# Patient Record
Sex: Male | Born: 1935 | Race: Black or African American | Hispanic: No | Marital: Married | Smoking: Never smoker
Health system: Southern US, Community
[De-identification: ages and names within clinical notes are randomized; demographics above are authoritative.]

## PROBLEM LIST (undated history)

## (undated) DIAGNOSIS — E119 Type 2 diabetes mellitus without complications: Secondary | ICD-10-CM

## (undated) DIAGNOSIS — N183 Chronic kidney disease, stage 3 unspecified: Secondary | ICD-10-CM

## (undated) DIAGNOSIS — C61 Malignant neoplasm of prostate: Secondary | ICD-10-CM

## (undated) DIAGNOSIS — I10 Essential (primary) hypertension: Secondary | ICD-10-CM

## (undated) DIAGNOSIS — C189 Malignant neoplasm of colon, unspecified: Secondary | ICD-10-CM

## (undated) HISTORY — PX: CARPAL TUNNEL RELEASE: SHX101

## (undated) HISTORY — PX: COLON SURGERY: SHX602

---

## 2005-03-09 ENCOUNTER — Inpatient Hospital Stay (HOSPITAL_COMMUNITY): Admission: EM | Admit: 2005-03-09 | Discharge: 2005-03-12 | Payer: Self-pay | Admitting: Emergency Medicine

## 2016-08-08 DIAGNOSIS — C61 Malignant neoplasm of prostate: Secondary | ICD-10-CM | POA: Insufficient documentation

## 2016-12-26 DIAGNOSIS — N138 Other obstructive and reflux uropathy: Secondary | ICD-10-CM | POA: Insufficient documentation

## 2017-02-07 DIAGNOSIS — G250 Essential tremor: Secondary | ICD-10-CM | POA: Insufficient documentation

## 2017-02-07 DIAGNOSIS — N183 Chronic kidney disease, stage 3 unspecified: Secondary | ICD-10-CM | POA: Diagnosis present

## 2017-02-07 DIAGNOSIS — N1832 Chronic kidney disease, stage 3b: Secondary | ICD-10-CM | POA: Diagnosis present

## 2017-02-07 DIAGNOSIS — Z9989 Dependence on other enabling machines and devices: Secondary | ICD-10-CM | POA: Insufficient documentation

## 2017-02-07 DIAGNOSIS — G4733 Obstructive sleep apnea (adult) (pediatric): Secondary | ICD-10-CM | POA: Insufficient documentation

## 2017-02-07 DIAGNOSIS — J45909 Unspecified asthma, uncomplicated: Secondary | ICD-10-CM | POA: Insufficient documentation

## 2018-08-03 ENCOUNTER — Inpatient Hospital Stay (HOSPITAL_COMMUNITY)
Admission: EM | Admit: 2018-08-03 | Discharge: 2018-08-17 | DRG: 336 | Disposition: A | Discharge status: Home Health | Payer: No Typology Code available for payment source | Attending: Internal Medicine | Admitting: Internal Medicine

## 2018-08-03 ENCOUNTER — Emergency Department (HOSPITAL_COMMUNITY): Payer: No Typology Code available for payment source

## 2018-08-03 ENCOUNTER — Encounter (HOSPITAL_COMMUNITY): Payer: Self-pay | Admitting: *Deleted

## 2018-08-03 ENCOUNTER — Other Ambulatory Visit: Payer: Self-pay

## 2018-08-03 ENCOUNTER — Inpatient Hospital Stay (HOSPITAL_COMMUNITY): Payer: No Typology Code available for payment source

## 2018-08-03 DIAGNOSIS — N189 Chronic kidney disease, unspecified: Secondary | ICD-10-CM | POA: Diagnosis not present

## 2018-08-03 DIAGNOSIS — Z4659 Encounter for fitting and adjustment of other gastrointestinal appliance and device: Secondary | ICD-10-CM

## 2018-08-03 DIAGNOSIS — I441 Atrioventricular block, second degree: Secondary | ICD-10-CM | POA: Diagnosis present

## 2018-08-03 DIAGNOSIS — K567 Ileus, unspecified: Secondary | ICD-10-CM | POA: Diagnosis not present

## 2018-08-03 DIAGNOSIS — Z6841 Body Mass Index (BMI) 40.0 and over, adult: Secondary | ICD-10-CM | POA: Diagnosis not present

## 2018-08-03 DIAGNOSIS — I129 Hypertensive chronic kidney disease with stage 1 through stage 4 chronic kidney disease, or unspecified chronic kidney disease: Secondary | ICD-10-CM | POA: Diagnosis present

## 2018-08-03 DIAGNOSIS — E1169 Type 2 diabetes mellitus with other specified complication: Secondary | ICD-10-CM | POA: Diagnosis present

## 2018-08-03 DIAGNOSIS — N183 Chronic kidney disease, stage 3 unspecified: Secondary | ICD-10-CM | POA: Diagnosis present

## 2018-08-03 DIAGNOSIS — Z1159 Encounter for screening for other viral diseases: Secondary | ICD-10-CM | POA: Diagnosis not present

## 2018-08-03 DIAGNOSIS — E669 Obesity, unspecified: Secondary | ICD-10-CM | POA: Diagnosis not present

## 2018-08-03 DIAGNOSIS — E119 Type 2 diabetes mellitus without complications: Secondary | ICD-10-CM | POA: Diagnosis present

## 2018-08-03 DIAGNOSIS — G4733 Obstructive sleep apnea (adult) (pediatric): Secondary | ICD-10-CM | POA: Diagnosis present

## 2018-08-03 DIAGNOSIS — Z5331 Laparoscopic surgical procedure converted to open procedure: Secondary | ICD-10-CM | POA: Diagnosis not present

## 2018-08-03 DIAGNOSIS — R200 Anesthesia of skin: Secondary | ICD-10-CM | POA: Diagnosis present

## 2018-08-03 DIAGNOSIS — R339 Retention of urine, unspecified: Secondary | ICD-10-CM | POA: Diagnosis present

## 2018-08-03 DIAGNOSIS — Z9049 Acquired absence of other specified parts of digestive tract: Secondary | ICD-10-CM

## 2018-08-03 DIAGNOSIS — D62 Acute posthemorrhagic anemia: Secondary | ICD-10-CM | POA: Diagnosis not present

## 2018-08-03 DIAGNOSIS — D631 Anemia in chronic kidney disease: Secondary | ICD-10-CM | POA: Diagnosis present

## 2018-08-03 DIAGNOSIS — Z85038 Personal history of other malignant neoplasm of large intestine: Secondary | ICD-10-CM | POA: Diagnosis present

## 2018-08-03 DIAGNOSIS — E870 Hyperosmolality and hypernatremia: Secondary | ICD-10-CM | POA: Diagnosis not present

## 2018-08-03 DIAGNOSIS — R0609 Other forms of dyspnea: Secondary | ICD-10-CM

## 2018-08-03 DIAGNOSIS — H9190 Unspecified hearing loss, unspecified ear: Secondary | ICD-10-CM | POA: Diagnosis present

## 2018-08-03 DIAGNOSIS — M5416 Radiculopathy, lumbar region: Secondary | ICD-10-CM | POA: Diagnosis present

## 2018-08-03 DIAGNOSIS — E1122 Type 2 diabetes mellitus with diabetic chronic kidney disease: Secondary | ICD-10-CM | POA: Diagnosis present

## 2018-08-03 DIAGNOSIS — M48061 Spinal stenosis, lumbar region without neurogenic claudication: Secondary | ICD-10-CM | POA: Diagnosis present

## 2018-08-03 DIAGNOSIS — Z7982 Long term (current) use of aspirin: Secondary | ICD-10-CM | POA: Diagnosis not present

## 2018-08-03 DIAGNOSIS — E785 Hyperlipidemia, unspecified: Secondary | ICD-10-CM | POA: Diagnosis present

## 2018-08-03 DIAGNOSIS — K56609 Unspecified intestinal obstruction, unspecified as to partial versus complete obstruction: Secondary | ICD-10-CM

## 2018-08-03 DIAGNOSIS — I959 Hypotension, unspecified: Secondary | ICD-10-CM | POA: Diagnosis not present

## 2018-08-03 DIAGNOSIS — I10 Essential (primary) hypertension: Secondary | ICD-10-CM | POA: Diagnosis present

## 2018-08-03 DIAGNOSIS — N1832 Chronic kidney disease, stage 3b: Secondary | ICD-10-CM | POA: Diagnosis present

## 2018-08-03 DIAGNOSIS — G250 Essential tremor: Secondary | ICD-10-CM | POA: Diagnosis present

## 2018-08-03 DIAGNOSIS — Z923 Personal history of irradiation: Secondary | ICD-10-CM

## 2018-08-03 DIAGNOSIS — K5651 Intestinal adhesions [bands], with partial obstruction: Principal | ICD-10-CM | POA: Diagnosis present

## 2018-08-03 DIAGNOSIS — E876 Hypokalemia: Secondary | ICD-10-CM | POA: Diagnosis not present

## 2018-08-03 DIAGNOSIS — Z79899 Other long term (current) drug therapy: Secondary | ICD-10-CM

## 2018-08-03 DIAGNOSIS — Z8546 Personal history of malignant neoplasm of prostate: Secondary | ICD-10-CM

## 2018-08-03 DIAGNOSIS — Z833 Family history of diabetes mellitus: Secondary | ICD-10-CM

## 2018-08-03 DIAGNOSIS — Z0189 Encounter for other specified special examinations: Secondary | ICD-10-CM

## 2018-08-03 DIAGNOSIS — Z794 Long term (current) use of insulin: Secondary | ICD-10-CM

## 2018-08-03 DIAGNOSIS — Z8719 Personal history of other diseases of the digestive system: Secondary | ICD-10-CM

## 2018-08-03 DIAGNOSIS — N179 Acute kidney failure, unspecified: Secondary | ICD-10-CM | POA: Diagnosis not present

## 2018-08-03 DIAGNOSIS — Z880 Allergy status to penicillin: Secondary | ICD-10-CM

## 2018-08-03 HISTORY — DX: Malignant neoplasm of prostate: C61

## 2018-08-03 HISTORY — DX: Essential (primary) hypertension: I10

## 2018-08-03 HISTORY — DX: Malignant neoplasm of colon, unspecified: C18.9

## 2018-08-03 HISTORY — DX: Type 2 diabetes mellitus without complications: E11.9

## 2018-08-03 HISTORY — DX: Unspecified intestinal obstruction, unspecified as to partial versus complete obstruction: K56.609

## 2018-08-03 HISTORY — DX: Chronic kidney disease, stage 3 unspecified: N18.30

## 2018-08-03 LAB — COMPREHENSIVE METABOLIC PANEL
ALT: 15 U/L (ref 0–44)
AST: 19 U/L (ref 15–41)
Albumin: 3.7 g/dL (ref 3.5–5.0)
Alkaline Phosphatase: 42 U/L (ref 38–126)
Anion gap: 10 (ref 5–15)
BUN: 28 mg/dL — ABNORMAL HIGH (ref 8–23)
CO2: 23 mmol/L (ref 22–32)
Calcium: 8.6 mg/dL — ABNORMAL LOW (ref 8.9–10.3)
Chloride: 104 mmol/L (ref 98–111)
Creatinine, Ser: 2.13 mg/dL — ABNORMAL HIGH (ref 0.61–1.24)
GFR calc Af Amer: 33 mL/min — ABNORMAL LOW (ref >60)
GFR calc non Af Amer: 28 mL/min — ABNORMAL LOW (ref >60)
Glucose, Bld: 199 mg/dL — ABNORMAL HIGH (ref 70–99)
Potassium: 4.6 mmol/L (ref 3.5–5.1)
Sodium: 137 mmol/L (ref 135–145)
Total Bilirubin: 0.9 mg/dL (ref 0.3–1.2)
Total Protein: 6.9 g/dL (ref 6.5–8.1)

## 2018-08-03 LAB — CBC
HCT: 27.4 % — ABNORMAL LOW (ref 39.0–52.0)
Hemoglobin: 9.4 g/dL — ABNORMAL LOW (ref 13.0–17.0)
MCH: 28.4 pg (ref 26.0–34.0)
MCHC: 34.3 g/dL (ref 30.0–36.0)
MCV: 82.8 fL (ref 80.0–100.0)
Platelets: 239 10*3/uL (ref 150–400)
RBC: 3.31 MIL/uL — ABNORMAL LOW (ref 4.22–5.81)
RDW: 14.5 % (ref 11.5–15.5)
WBC: 5.9 10*3/uL (ref 4.0–10.5)
nRBC: 0 % (ref 0.0–0.2)

## 2018-08-03 LAB — URINALYSIS, ROUTINE W REFLEX MICROSCOPIC
Bacteria, UA: NONE SEEN
Bilirubin Urine: NEGATIVE
Glucose, UA: NEGATIVE mg/dL
Hgb urine dipstick: NEGATIVE
Ketones, ur: NEGATIVE mg/dL
Leukocytes,Ua: NEGATIVE
Nitrite: NEGATIVE
Protein, ur: 30 mg/dL — AB
Specific Gravity, Urine: 1.019 (ref 1.005–1.030)
pH: 6 (ref 5.0–8.0)

## 2018-08-03 LAB — SARS CORONAVIRUS 2 BY RT PCR (HOSPITAL ORDER, PERFORMED IN ~~LOC~~ HOSPITAL LAB): SARS Coronavirus 2: NEGATIVE

## 2018-08-03 LAB — LIPASE, BLOOD: Lipase: 31 U/L (ref 11–51)

## 2018-08-03 MED ORDER — SODIUM CHLORIDE 0.9% FLUSH: 3.0000 mL | Freq: Once | INTRAVENOUS | Status: DC

## 2018-08-03 MED ORDER — ACETAMINOPHEN 325 MG PO TABS: 650.0000 mg | ORAL_TABLET | Freq: Four times a day (QID) | ORAL | Status: DC | PRN

## 2018-08-03 MED ORDER — ONDANSETRON HCL 4 MG/2ML IJ SOLN: 4.0000 mg | Freq: Four times a day (QID) | INTRAMUSCULAR | Status: DC | PRN

## 2018-08-03 MED ORDER — ONDANSETRON HCL 4 MG PO TABS: 4.0000 mg | ORAL_TABLET | Freq: Four times a day (QID) | ORAL | Status: DC | PRN

## 2018-08-03 MED ORDER — INSULIN ASPART 100 UNIT/ML ~~LOC~~ SOLN
0.0000 [IU] | SUBCUTANEOUS | Status: DC
  Administered 2018-08-04 (×5): 2 [IU] via SUBCUTANEOUS
  Administered 2018-08-04: 1 [IU] via SUBCUTANEOUS
  Administered 2018-08-05 (×2): 3 [IU] via SUBCUTANEOUS
  Administered 2018-08-05: 2 [IU] via SUBCUTANEOUS
  Administered 2018-08-05: 3 [IU] via SUBCUTANEOUS
  Administered 2018-08-05 – 2018-08-06 (×3): 2 [IU] via SUBCUTANEOUS
  Administered 2018-08-06: 1 [IU] via SUBCUTANEOUS
  Administered 2018-08-06 – 2018-08-08 (×14): 2 [IU] via SUBCUTANEOUS

## 2018-08-03 MED ORDER — HYDRALAZINE HCL 20 MG/ML IJ SOLN: 10.0000 mg | INTRAMUSCULAR | Status: DC | PRN

## 2018-08-03 MED ORDER — DIATRIZOATE MEGLUMINE & SODIUM 66-10 % PO SOLN
90.0000 mL | Freq: Once | ORAL | Status: AC
  Administered 2018-08-04: 90 mL via NASOGASTRIC
  Filled 2018-08-03: qty 90

## 2018-08-03 MED ORDER — SODIUM CHLORIDE 0.9 % IV BOLUS
500.0000 mL | Freq: Once | INTRAVENOUS | Status: AC
  Administered 2018-08-03: 500 mL via INTRAVENOUS

## 2018-08-03 MED ORDER — ACETAMINOPHEN 650 MG RE SUPP: 650.0000 mg | Freq: Four times a day (QID) | RECTAL | Status: DC | PRN

## 2018-08-03 MED ORDER — MORPHINE SULFATE (PF) 2 MG/ML IV SOLN: 1.0000 mg | INTRAVENOUS | Status: DC | PRN

## 2018-08-03 NOTE — ED Provider Notes (Signed)
[facility]PART[facility] Provider Note   CSN: [id number] Arrival date & time: 08/03/18  1410    History   Chief Complaint Chief Complaint  Patient presents with  . Abdominal Pain    HPI Daniel Odom is a 83 y.o. male with history of hypertension, diabetes, reported bowel obstruction who presents with abdominal pain and urinary retention since midnight.  Patient reports he has had some small amounts of urine, but unable to empty his bladder.  He denies any nausea, vomiting.  He is not passing gas.  He reports having a bowel movement yesterday which was somewhat normal.  Patient has history of prostate procedures, but has never had urinary retention, he states he has had incontinence instead.  Patient denies any fever, chest pain, shortness of breath, cough.  Patient also reports a one-month history of intermittent right leg pain and tingling.  He reports yesterday he got off the toilet and his leg was numb and he fell.  He reports he hit towels on a rug and did not hurt himself.  Patient denies any back pain.     HPI  Past Medical History:  Diagnosis Date  . Diabetes mellitus without complication ([place])   . Hypertension     Patient Active Problem List   Diagnosis Date Noted  . SBO (small bowel obstruction) ([place]) 08/03/2018  . History of colon cancer 08/03/2018  . Diabetes mellitus type 2 in obese ([place]) 08/03/2018  . Essential hypertension 08/03/2018  . CKD (chronic kidney disease) stage 3, GFR 30-59 ml/min ([facility]) 08/03/2018  . Anemia associated with chronic renal failure 08/03/2018    Past Surgical History:  Procedure Laterality Date  . COLON SURGERY          Home Medications    Prior to Admission medications   Medication Sig Start Date End Date Taking? Authorizing Provider  Albuterol Sulfate 108 (90 Base) MCG/ACT AEPB Inhale 2 puffs into the lungs every 6 (six) hours as needed for wheezing.   Yes [provider]  aspirin 81 MG  chewable tablet Chew 81 mg by mouth daily.   Yes [provider]  atorvastatin (LIPITOR) 80 MG tablet Take 80 mg by mouth at bedtime.   Yes [provider]  CALCIUM PO Take 1 tablet by mouth 2 (two) times a day.   Yes [provider]  Carboxymethylcellulose Sod PF (REFRESH PLUS) 0.5 % SOLN Place 1 drop into both eyes 2 (two) times daily as needed (dry eyes).    Yes [provider]  Cholecalciferol (VITAMIN D-1000 MAX ST) 25 MCG (1000 UT) tablet Take 1,000 Units by mouth daily.   Yes [provider]  cyclobenzaprine (FLEXERIL) 10 MG tablet Take 10 mg by mouth 3 (three) times daily as needed for muscle spasms.   Yes [provider]  fluticasone (FLONASE) 50 MCG/ACT nasal spray Place 1 spray into both nostrils daily as needed for allergies or rhinitis.   Yes [provider]  insulin NPH-regular Human (NOVOLIN 70/30) (70-30) 100 UNIT/ML injection Inject 7-30 Units into the skin See admin instructions. 7u in the AM and 30 in the evening   Yes [provider]  lisinopril-hydrochlorothiazide (ZESTORETIC) 20-12.5 MG tablet Take 1 tablet by mouth daily.   Yes [provider]  mometasone (ASMANEX) 220 MCG/INH inhaler Inhale 2 puffs into the lungs at bedtime.   Yes [provider]  polyethylene glycol (MIRALAX / GLYCOLAX) 17 g packet Take 17 g by mouth 2 (two) times a  day. 02/16/17  Yes [provider]  primidone (MYSOLINE) 50 MG tablet Take 100-150 mg by mouth See admin instructions. 100mg  in the AM and 150mg  PM   Yes [provider]    Family History Family History  Problem Relation Age of Onset  . Diabetes Mellitus II Brother   . Colon cancer Neg Hx     Social History Social History   Tobacco Use  . Smoking status: Never Smoker  . Smokeless tobacco: Never Used  Substance Use Topics  . Alcohol use: Not on file  . Drug use: Not on file     Allergies   Penicillins   Review of Systems  Review of Systems  Constitutional: Negative for chills and fever.  HENT: Negative for facial swelling and sore throat.   Respiratory: Negative for shortness of breath.   Cardiovascular: Negative for chest pain.  Gastrointestinal: Positive for abdominal pain and constipation. Negative for diarrhea, nausea and vomiting.  Genitourinary: Positive for decreased urine volume and difficulty urinating. Negative for dysuria.  Musculoskeletal: Negative for back pain.  Skin: Negative for rash and wound.  Neurological: Negative for headaches.  Psychiatric/Behavioral: The patient is not nervous/anxious.      Physical Exam Updated Vital Signs BP (!) 148/62 (BP Location: Left Arm)   Pulse 74   Temp 98 F (36.7 C) (Oral)   Resp 18   Ht 5\' 7"  (1.702 m)   Wt 121.5 kg   SpO2 100%   BMI 41.95 kg/m   Physical Exam Vitals signs and nursing note reviewed.  Constitutional:      General: He is not in acute distress.    Appearance: He is well-developed. He is not diaphoretic.  HENT:     Head: Normocephalic and atraumatic.     Mouth/Throat:     Pharynx: No oropharyngeal exudate.  Eyes:     General: No scleral icterus.       Right eye: No discharge.        Left eye: No discharge.     Conjunctiva/sclera: Conjunctivae normal.     Pupils: Pupils are equal, round, and reactive to light.  Neck:     Musculoskeletal: Normal range of motion and neck supple.     Thyroid: No thyromegaly.  Cardiovascular:     Rate and Rhythm: Normal rate and regular rhythm.     Heart sounds: Normal heart sounds. No murmur. No friction rub. No gallop.   Pulmonary:     Effort: Pulmonary effort is normal. No respiratory distress.     Breath sounds: Normal breath sounds. No stridor. No wheezing or rales.  Abdominal:     General: Bowel sounds are normal. There is no distension.     Palpations: Abdomen is soft.     Tenderness: There is abdominal tenderness in the epigastric area and periumbilical area. There is no right  CVA tenderness, left CVA tenderness, guarding or rebound.  Lymphadenopathy:     Cervical: No cervical adenopathy.  Skin:    General: Skin is warm and dry.     Coloration: Skin is not pale.     Findings: No rash.  Neurological:     Mental Status: He is alert.     Coordination: Coordination normal.      ED Treatments / Results  Labs (all labs ordered are listed, but only abnormal results are displayed) Labs Reviewed  COMPREHENSIVE METABOLIC PANEL - Abnormal; Notable for the following components:      Result Value   Glucose, Bld 199 (*)  BUN 28 (*)    Creatinine, Ser 2.13 (*)    Calcium 8.6 (*)    GFR calc non Af Amer 28 (*)    GFR calc Af Amer 33 (*)    All other components within normal limits  CBC - Abnormal; Notable for the following components:   RBC 3.31 (*)    Hemoglobin 9.4 (*)    HCT 27.4 (*)    All other components within normal limits  URINALYSIS, ROUTINE W REFLEX MICROSCOPIC - Abnormal; Notable for the following components:   Protein, ur 30 (*)    All other components within normal limits  GLUCOSE, CAPILLARY - Abnormal; Notable for the following components:   Glucose-Capillary 171 (*)    All other components within normal limits  SARS CORONAVIRUS 2 (HOSPITAL ORDER, [place] [facility])  LIPASE, BLOOD  COMPREHENSIVE METABOLIC PANEL  CBC WITH DIFFERENTIAL/PLATELET    EKG EKG Interpretation  Date/Time:  Thursday August 03 2018 14:35:20 EDT Ventricular Rate:  69 PR Interval:  344 QRS Duration: 114 QT Interval:  432 QTC Calculation: 462 R Axis:   84 Text Interpretation:  Sinus rhythm with 1st degree A-V block Possible Anterior infarct , age undetermined Abnormal ECG no prior to compare with Confirmed by Aletta Edouard [phone]) on 08/03/2018 5:28:52 PM   Radiology Ct Abdomen Pelvis Wo Contrast  Result Date: 08/03/2018 CLINICAL DATA:  History of small bowel obstruction. Recurrent symptoms. EXAM: CT ABDOMEN AND PELVIS WITHOUT CONTRAST  TECHNIQUE: Multidetector CT imaging of the abdomen and pelvis was performed following the standard protocol without IV contrast. COMPARISON:  None. FINDINGS: Lower chest: Normal Hepatobiliary: Liver parenchyma is normal.  No calcified gallstones. Pancreas: Normal Spleen: Normal Adrenals/Urinary Tract: Adrenal glands show mild hyperplasia. No focal renal lesion. The kidneys are slightly small. No obstruction. Bladder appears normal. Stomach/Bowel: Acute small bowel obstruction with dilated fluid and air-filled small intestine. The distal small bowel is collapsed. There is ascites, often seen with small-bowel obstruction. There is sigmoid diverticulosis without evidence of diverticulitis. Previous colon anastomosis in the region of the hepatic flexure colon is patulous at that level, possibly due to denervation. Vascular/Lymphatic: Aortic atherosclerosis. No aneurysm. IVC is normal. No retroperitoneal adenopathy. Reproductive: Multiple prostate seed implants. Other: No free air.  Ascites as noted above. Musculoskeletal: Ordinary lower lumbar degenerative changes. IMPRESSION: Small-bowel obstruction in about the midportion. Partial or early. Distal small bowel is not dilated. Stool in the colon. Previous colon resection/anastomosis in the hepatic flexure region. Small amount of ascites presumably associated with the small bowel obstruction. No free air. Electronically Signed   By: Nelson Chimes M.D.   On: 08/03/2018 20:50   Ct L-spine No Charge  Result Date: 08/03/2018 CLINICAL DATA:  Abdominal pain.  Fall with back pain. EXAM: CT LUMBAR SPINE WITHOUT CONTRAST TECHNIQUE: Multidetector CT imaging of the lumbar spine was performed without intravenous contrast administration. Multiplanar CT image reconstructions were also generated. COMPARISON:  None. FINDINGS: Segmentation: 5 lumbar type vertebral bodies. Alignment: Straightening of the normal lumbar lordosis. Vertebrae: No fracture or primary bone lesion. Paraspinal  and other soft tissues: See results of abdominal CT. Disc levels: No significant disc space finding L3-4 or above. Minimal disc bulges. Ordinary facet osteoarthritis. No compressive canal or foraminal narrowing. L4-5: Moderate bulging of the disc. Mild facet degeneration. Mild stenosis the lateral recesses and foramina. L5-S1: Disc degeneration with disc space narrowing. Chronic endplate osteophytes more prominent towards the right. Facet osteoarthritis. Narrowing of the subarticular lateral recess on the right  and the intervertebral foramen on the right that could possibly be associated with right-sided nerve compression. Solid bridging osteophytes of the sacroiliac joints. IMPRESSION: No acute or traumatic lumbar finding. Degenerative disc disease and degenerative facet disease at L4-5. Mild stenosis of the lateral recesses and foramina at L4-5. Right subarticular lateral recess and foraminal narrowing at L5-S1 Chronic bridging sacroiliac osteophytes. Electronically Signed   By: Nelson Chimes M.D.   On: 08/03/2018 20:44   Dg Abd Portable 1v-small Bowel Protocol-position Verification  Result Date: 08/04/2018 CLINICAL DATA:  Check NG tube placement EXAM: PORTABLE ABDOMEN - 1 VIEW COMPARISON:  None. FINDINGS: Scattered large and small bowel gas is noted. Diffuse small bowel dilatation is noted similar to that seen on recent CT examination. Gastric catheter is been placed with the tip in the stomach. Proximal side port lies in the distal esophagus. This should be advanced several cm further into the stomach. IMPRESSION: Nasogastric catheter with tip in stomach. This should be advanced several cm to allow the proximal side port to lie within the stomach as well. Persistent small bowel dilatation. Electronically Signed   By: Inez Catalina M.D.   On: 08/04/2018 00:00    Procedures Procedures (including critical care time)  Medications Ordered in ED Medications  sodium chloride flush (NS) 0.9 % injection 3 mL  (has no administration in time range)  acetaminophen (TYLENOL) tablet 650 mg (has no administration in time range)    Or  acetaminophen (TYLENOL) suppository 650 mg (has no administration in time range)  ondansetron (ZOFRAN) tablet 4 mg (has no administration in time range)    Or  ondansetron (ZOFRAN) injection 4 mg (has no administration in time range)  insulin aspart (novoLOG) injection 0-9 Units (2 Units Subcutaneous Given 08/04/18 0015)  hydrALAZINE (APRESOLINE) injection 10 mg (has no administration in time range)  morphine 2 MG/ML injection 1 mg (has no administration in time range)  diatrizoate meglumine-sodium (GASTROGRAFIN) 66-10 % solution 90 mL (has no administration in time range)  sodium chloride 0.9 % bolus 500 mL (0 mLs Intravenous Stopped 08/03/18 2003)     Initial Impression / Assessment and Plan / ED Course  I have reviewed the triage vital signs and the nursing notes.  Pertinent labs & imaging results that were available during my care of the patient were reviewed by me and considered in my medical decision making (see chart for details).  Clinical Course as of Aug 04 134  Thu Aug 02, 4085  [id number] 83 year old male here with abdominal pain and dry heaving and also feeling like he is urinating less.  He is got some mild tenderness diffusely in his abdomen.  CT is showing a partial bowel obstruction.  We are placing an NG and he will need to be admitted to the hospital for further management.   [MB]    Clinical Course User Index [MB] Hayden Rasmussen, MD       Patient presenting with abdominal pain and decreased urination.  Bladder scan shows only around 30 cc.  Patient able to urinate to some degree.  CT abdomen pelvis shows small bowel obstruction.  I discussed patient case with Dr. Donne Hazel with general surgery who will evaluate the patient, but recommend medical admission with NG tube.  Patient not requiring pain medication in the ED, comfortable lying flat.   Regarding patient's intermittent leg pain, suspect radicular pain.  CT of the lumbar does show some stenosis at L4-S1.  I discussed patient case with Dr. Hal Hope with [facility]  who accepts patient for admission.  Patient also evaluated by my attending, Dr. Melina Copa, who guided the patient's management and agrees with plan.  Final Clinical Impressions(s) / ED Diagnoses   Final diagnoses:  SBO (small bowel obstruction) [facility])  Lumbar radiculopathy    ED Discharge Orders    None       Frederica Kuster, PA-C 08/04/18 0136    Hayden Rasmussen, MD 08/04/18 1011

## 2018-08-03 NOTE — Consult Note (Signed)
Reason for Consult:abd pain Referring Physician: Armstead Peaks PA  Daniel Odom is an 83 y.o. male.  HPI: 83 yom with history of right colectomy for colon cancer in 1993,. Prior sbo in 2007 managed conservatively presents with about 24 hours history of nausea, dry heaves, no flatus or bm and inability to urinate. This is worsening. Nothing doing at home making it better.  No cough. No fever.  He did have fall off the toilet today but no issues from that.  He was evaluated in er and appears to have sbo on ct scan. He has ng tube already when I see him    Past Medical History:  Diagnosis Date  . Diabetes mellitus without complication ([place])   . Hypertension     Past Surgical History:  Procedure Laterality Date  . COLON SURGERY      Family History  Problem Relation Age of Onset  . Diabetes Mellitus II Brother   . Colon cancer Neg Hx     Social History:  reports that he has never smoked. He has never used smokeless tobacco. No history on file for alcohol and drug.  Allergies:  Allergies  Allergen Reactions  . Penicillins Hives    Did it involve swelling of the face/tongue/throat, SOB, or low BP? Yes Did it involve sudden or severe rash/hives, skin peeling, or any reaction on the inside of your mouth or nose? No Did you need to seek medical attention at a hospital or doctor's office? Yes When did it last happen?1968 If all above answers are "NO", may proceed with cephalosporin use.     Medications: I have reviewed the patient's current medications.  Results for orders placed or performed during the hospital encounter of 08/03/18 (from the past 48 hour(s))  Lipase, blood     Status: None   Collection Time: 08/03/18  2:45 PM  Result Value Ref Range   Lipase 31 11 - 51 U/L    Comment: Performed at [facility], [place] [address] [place], [place] [address]  Comprehensive metabolic panel     Status: Abnormal   Collection Time: 08/03/18  2:45 PM  Result Value Ref Range   Sodium 137 135 - 145 mmol/L   Potassium 4.6 3.5 - 5.1 mmol/L   Chloride 104 98 - 111 mmol/L   CO2 23 22 - 32 mmol/L   Glucose, Bld 199 (H) 70 - 99 mg/dL   BUN 28 (H) 8 - 23 mg/dL   Creatinine, Ser 2.13 (H) 0.61 - 1.24 mg/dL   Calcium 8.6 (L) 8.9 - 10.3 mg/dL   Total Protein 6.9 6.5 - 8.1 g/dL   Albumin 3.7 3.5 - 5.0 g/dL   AST 19 15 - 41 U/L   ALT 15 0 - 44 U/L   Alkaline Phosphatase 42 38 - 126 U/L   Total Bilirubin 0.9 0.3 - 1.2 mg/dL   GFR calc non Af Amer 28 (L) >60 mL/min   GFR calc Af Amer 33 (L) >60 mL/min   Anion gap 10 5 - 15    Comment: Performed at [facility] [address] [place] [address]  CBC     Status: Abnormal   Collection Time: 08/03/18  2:45 PM  Result Value Ref Range   WBC 5.9 4.0 - 10.5 K/uL   RBC 3.31 (L) 4.22 - 5.81 MIL/uL   Hemoglobin 9.4 (L) 13.0 - 17.0 g/dL   HCT 27.4 (L) 39.0 - 52.0 %   MCV 82.8 80.0 -  100.0 fL   MCH 28.4 26.0 - 34.0 pg   MCHC 34.3 30.0 - 36.0 g/dL   RDW 14.5 11.5 - 15.5 %   Platelets 239 150 - 400 K/uL   nRBC 0.0 0.0 - 0.2 %    Comment: Performed at [facility], [place] [address] [place], [place] [address]  SARS Coronavirus 2 (CEPHEID - Performed in [facility] hospital lab), Hosp Order     Status: None   Collection Time: 08/03/18  9:21 PM   Specimen: Nasopharyngeal Swab  Result Value Ref Range   SARS Coronavirus 2 NEGATIVE NEGATIVE    Comment: (NOTE) If result is NEGATIVE SARS-CoV-2 target nucleic acids are NOT DETECTED. The SARS-CoV-2 RNA is generally detectable in upper and lower  respiratory specimens during the acute phase of infection. The lowest  concentration of SARS-CoV-2 viral copies this assay can detect is 250  copies / mL. A negative result does not preclude SARS-CoV-2 infection  and should not be used as the sole basis for treatment or other  patient management decisions.  A negative result may occur with  improper specimen collection / handling, submission of specimen other  than  nasopharyngeal swab, presence of viral mutation(s) within the  areas targeted by this assay, and inadequate number of viral copies  (<250 copies / mL). A negative result must be combined with clinical  observations, patient history, and epidemiological information. If result is POSITIVE SARS-CoV-2 target nucleic acids are DETECTED. The SARS-CoV-2 RNA is generally detectable in upper and lower  respiratory specimens dur ing the acute phase of infection.  Positive  results are indicative of active infection with SARS-CoV-2.  Clinical  correlation with patient history and other diagnostic information is  necessary to determine patient infection status.  Positive results do  not rule out bacterial infection or co-infection with other viruses. If result is PRESUMPTIVE POSTIVE SARS-CoV-2 nucleic acids MAY BE PRESENT.   A presumptive positive result was obtained on the submitted specimen  and confirmed on repeat testing.  While 2019 novel coronavirus  (SARS-CoV-2) nucleic acids may be present in the submitted sample  additional confirmatory testing may be necessary for epidemiological  and / or clinical management purposes  to differentiate between  SARS-CoV-2 and other Sarbecovirus currently known to infect humans.  If clinically indicated additional testing with an alternate test  methodology [phone]) is advised. The SARS-CoV-2 RNA is generally  detectable in upper and lower respiratory sp ecimens during the acute  phase of infection. The expected result is Negative. Fact Sheet for Patients:  StrictlyIdeas.no Fact Sheet for Healthcare Providers: BankingDealers.co.za This test is not yet approved or cleared by the [place] FDA and has been authorized for detection and/or diagnosis of SARS-CoV-2 by FDA under an Emergency Use Authorization (EUA).  This EUA will remain in effect (meaning this test can be used) for the duration of the  COVID-19 declaration under Section 564(b)(1) of the Act, 21 U.S.C. section 360bbb-3(b)(1), unless the authorization is terminated or revoked sooner. Performed at [facility], [place] [address] [place], [place] [address]     Ct Abdomen Pelvis Wo Contrast  Result Date: 08/03/2018 CLINICAL DATA:  History of small bowel obstruction. Recurrent symptoms. EXAM: CT ABDOMEN AND PELVIS WITHOUT CONTRAST TECHNIQUE: Multidetector CT imaging of the abdomen and pelvis was performed following the standard protocol without IV contrast. COMPARISON:  None. FINDINGS: Lower chest: Normal Hepatobiliary: Liver parenchyma is normal.  No calcified gallstones. Pancreas: Normal Spleen: Normal Adrenals/Urinary Tract: Adrenal  glands show mild hyperplasia. No focal renal lesion. The kidneys are slightly small. No obstruction. Bladder appears normal. Stomach/Bowel: Acute small bowel obstruction with dilated fluid and air-filled small intestine. The distal small bowel is collapsed. There is ascites, often seen with small-bowel obstruction. There is sigmoid diverticulosis without evidence of diverticulitis. Previous colon anastomosis in the region of the hepatic flexure colon is patulous at that level, possibly due to denervation. Vascular/Lymphatic: Aortic atherosclerosis. No aneurysm. IVC is normal. No retroperitoneal adenopathy. Reproductive: Multiple prostate seed implants. Other: No free air.  Ascites as noted above. Musculoskeletal: Ordinary lower lumbar degenerative changes. IMPRESSION: Small-bowel obstruction in about the midportion. Partial or early. Distal small bowel is not dilated. Stool in the colon. Previous colon resection/anastomosis in the hepatic flexure region. Small amount of ascites presumably associated with the small bowel obstruction. No free air. Electronically Signed   By: Nelson Chimes M.D.   On: 08/03/2018 20:50   Ct L-spine No Charge  Result Date: 08/03/2018 CLINICAL DATA:  Abdominal pain.  Fall with  back pain. EXAM: CT LUMBAR SPINE WITHOUT CONTRAST TECHNIQUE: Multidetector CT imaging of the lumbar spine was performed without intravenous contrast administration. Multiplanar CT image reconstructions were also generated. COMPARISON:  None. FINDINGS: Segmentation: 5 lumbar type vertebral bodies. Alignment: Straightening of the normal lumbar lordosis. Vertebrae: No fracture or primary bone lesion. Paraspinal and other soft tissues: See results of abdominal CT. Disc levels: No significant disc space finding L3-4 or above. Minimal disc bulges. Ordinary facet osteoarthritis. No compressive canal or foraminal narrowing. L4-5: Moderate bulging of the disc. Mild facet degeneration. Mild stenosis the lateral recesses and foramina. L5-S1: Disc degeneration with disc space narrowing. Chronic endplate osteophytes more prominent towards the right. Facet osteoarthritis. Narrowing of the subarticular lateral recess on the right and the intervertebral foramen on the right that could possibly be associated with right-sided nerve compression. Solid bridging osteophytes of the sacroiliac joints. IMPRESSION: No acute or traumatic lumbar finding. Degenerative disc disease and degenerative facet disease at L4-5. Mild stenosis of the lateral recesses and foramina at L4-5. Right subarticular lateral recess and foraminal narrowing at L5-S1 Chronic bridging sacroiliac osteophytes. Electronically Signed   By: Nelson Chimes M.D.   On: 08/03/2018 20:44    Review of Systems  Gastrointestinal: Positive for abdominal pain, constipation and nausea.  All other systems reviewed and are negative.  Blood pressure (!) 81/59, pulse 91, temperature 98.2 F (36.8 C), temperature source Oral, resp. rate 16, SpO2 99 %. Physical Exam  Constitutional: He is oriented to person, place, and time. He appears well-developed and well-nourished.  HENT:  Head: Normocephalic and atraumatic.  Right Ear: External ear normal.  Left Ear: External ear normal.   Eyes: EOM are normal. No scleral icterus.  Neck: Neck supple.  Cardiovascular: Normal rate, regular rhythm and normal heart sounds.  Respiratory: Effort normal and breath sounds normal.  GI: Soft. He exhibits distension (mild). Bowel sounds are decreased. There is no abdominal tenderness.    Musculoskeletal:        General: No tenderness.  Neurological: He is alert and oriented to person, place, and time.  Skin: Skin is warm and dry.  Psychiatric: He has a normal mood and affect. His behavior is normal.    Assessment/Plan: SBO -ng tube, protocol, hopefully will resolve conservatively -can start on pharm dvt prophylaxis from surgery standpoint -discussed indication for surgery being worsening or nonprogression  Rolm Bookbinder 08/03/2018, 11:24 PM

## 2018-08-03 NOTE — ED Notes (Signed)
Called to give report. Left on hold

## 2018-08-03 NOTE — ED Notes (Signed)
Bladder scan showed approximately 33 mL urine.

## 2018-08-03 NOTE — ED Notes (Signed)
[place] (daugther) update given.

## 2018-08-03 NOTE — ED Triage Notes (Addendum)
To ED for eval of abd pain. States he got up to use the bathroom during the night -when getting off the toilet his left leg was numb causing him to fall. States he landed on towels and a rug so no injury. No cp. No sob. States he's been urinating less and less. Pt appears in nad. Alert and oriented.

## 2018-08-03 NOTE — ED Notes (Signed)
ED TO INPATIENT HANDOFF REPORT  ED Nurse Name and Phone #:  [phone]  S Name/Age/Gender Daniel Odom 83 y.o. male Room/Bed: 038C/038C  Code Status   Code Status: Not on file  Home/SNF/Other Home Patient oriented to: self, place, time and situation Is this baseline? Yes   Triage Complete: Triage complete  Chief Complaint abd pain  Triage Note To ED for eval of abd pain. States he got up to use the bathroom during the night -when getting off the toilet his left leg was numb causing him to fall. States he landed on towels and a rug so no injury. No cp. No sob. States he's been urinating less and less. Pt appears in nad. Alert and oriented.    Allergies Allergies  Allergen Reactions  . Penicillins Hives    Did it involve swelling of the face/tongue/throat, SOB, or low BP? Yes Did it involve sudden or severe rash/hives, skin peeling, or any reaction on the inside of your mouth or nose? No Did you need to seek medical attention at a hospital or doctor's office? Yes When did it last happen?1968 If all above answers are "NO", may proceed with cephalosporin use.     Level of Care/Admitting Diagnosis ED Disposition    ED Disposition Condition [place] [facility] Area: [place] [[id number]]  Level of Care: Telemetry Medical [104]  Covid Evaluation: Asymptomatic Screening Protocol (No Symptoms)  Diagnosis: SBO (small bowel obstruction) [facility]) [[id number]]  Admitting Physician: Rise Patience [phone]  Attending Physician: Rise Patience [phone]  Estimated length of stay: past midnight tomorrow  Certification:: I certify this patient will need inpatient services for at least 2 midnights  PT Class (Do Not Modify): Inpatient [101]  PT Acc Code (Do Not Modify): Private [1]       B Medical/Surgery History Past Medical History:  Diagnosis Date  . Diabetes mellitus without complication ([place])   . Hypertension       A IV  Location/Drains/Wounds Patient Lines/Drains/Airways Status   Active Line/Drains/Airways    Name:   Placement date:   Placement time:   Site:   Days:   Peripheral IV 08/03/18 Right Antecubital   08/03/18    1902    Antecubital   less than 1          Intake/Output Last 24 hours  Intake/Output Summary (Last 24 hours) at 08/03/2018 2147 Last data filed at 08/03/2018 1753 Gross per 24 hour  Intake 0 ml  Output 0 ml  Net 0 ml    Labs/Imaging Results for orders placed or performed during the hospital encounter of 08/03/18 (from the past 48 hour(s))  Lipase, blood     Status: None   Collection Time: 08/03/18  2:45 PM  Result Value Ref Range   Lipase 31 11 - 51 U/L    Comment: Performed at [facility], [place] [address], [place], [place] [address]  Comprehensive metabolic panel     Status: Abnormal   Collection Time: 08/03/18  2:45 PM  Result Value Ref Range   Sodium 137 135 - 145 mmol/L   Potassium 4.6 3.5 - 5.1 mmol/L   Chloride 104 98 - 111 mmol/L   CO2 23 22 - 32 mmol/L   Glucose, Bld 199 (H) 70 - 99 mg/dL   BUN 28 (H) 8 - 23 mg/dL   Creatinine, Ser 2.13 (H) 0.61 - 1.24 mg/dL   Calcium 8.6 (L) 8.9 - 10.3 mg/dL   Total Protein  6.9 6.5 - 8.1 g/dL   Albumin 3.7 3.5 - 5.0 g/dL   AST 19 15 - 41 U/L   ALT 15 0 - 44 U/L   Alkaline Phosphatase 42 38 - 126 U/L   Total Bilirubin 0.9 0.3 - 1.2 mg/dL   GFR calc non Af Amer 28 (L) >60 mL/min   GFR calc Af Amer 33 (L) >60 mL/min   Anion gap 10 5 - 15    Comment: Performed at [facility] [address], [place], [place] [address]  CBC     Status: Abnormal   Collection Time: 08/03/18  2:45 PM  Result Value Ref Range   WBC 5.9 4.0 - 10.5 K/uL   RBC 3.31 (L) 4.22 - 5.81 MIL/uL   Hemoglobin 9.4 (L) 13.0 - 17.0 g/dL   HCT 27.4 (L) 39.0 - 52.0 %   MCV 82.8 80.0 - 100.0 fL   MCH 28.4 26.0 - 34.0 pg   MCHC 34.3 30.0 - 36.0 g/dL   RDW 14.5 11.5 - 15.5 %   Platelets 239 150 - 400 K/uL   nRBC 0.0 0.0 - 0.2 %    Comment:  Performed at [facility], [place] [address], [place], [place] [address]   Ct Abdomen Pelvis Wo Contrast  Result Date: 08/03/2018 CLINICAL DATA:  History of small bowel obstruction. Recurrent symptoms. EXAM: CT ABDOMEN AND PELVIS WITHOUT CONTRAST TECHNIQUE: Multidetector CT imaging of the abdomen and pelvis was performed following the standard protocol without IV contrast. COMPARISON:  None. FINDINGS: Lower chest: Normal Hepatobiliary: Liver parenchyma is normal.  No calcified gallstones. Pancreas: Normal Spleen: Normal Adrenals/Urinary Tract: Adrenal glands show mild hyperplasia. No focal renal lesion. The kidneys are slightly small. No obstruction. Bladder appears normal. Stomach/Bowel: Acute small bowel obstruction with dilated fluid and air-filled small intestine. The distal small bowel is collapsed. There is ascites, often seen with small-bowel obstruction. There is sigmoid diverticulosis without evidence of diverticulitis. Previous colon anastomosis in the region of the hepatic flexure colon is patulous at that level, possibly due to denervation. Vascular/Lymphatic: Aortic atherosclerosis. No aneurysm. IVC is normal. No retroperitoneal adenopathy. Reproductive: Multiple prostate seed implants. Other: No free air.  Ascites as noted above. Musculoskeletal: Ordinary lower lumbar degenerative changes. IMPRESSION: Small-bowel obstruction in about the midportion. Partial or early. Distal small bowel is not dilated. Stool in the colon. Previous colon resection/anastomosis in the hepatic flexure region. Small amount of ascites presumably associated with the small bowel obstruction. No free air. Electronically Signed   By: Nelson Chimes M.D.   On: 08/03/2018 20:50   Ct L-spine No Charge  Result Date: 08/03/2018 CLINICAL DATA:  Abdominal pain.  Fall with back pain. EXAM: CT LUMBAR SPINE WITHOUT CONTRAST TECHNIQUE: Multidetector CT imaging of the lumbar spine was performed without intravenous contrast  administration. Multiplanar CT image reconstructions were also generated. COMPARISON:  None. FINDINGS: Segmentation: 5 lumbar type vertebral bodies. Alignment: Straightening of the normal lumbar lordosis. Vertebrae: No fracture or primary bone lesion. Paraspinal and other soft tissues: See results of abdominal CT. Disc levels: No significant disc space finding L3-4 or above. Minimal disc bulges. Ordinary facet osteoarthritis. No compressive canal or foraminal narrowing. L4-5: Moderate bulging of the disc. Mild facet degeneration. Mild stenosis the lateral recesses and foramina. L5-S1: Disc degeneration with disc space narrowing. Chronic endplate osteophytes more prominent towards the right. Facet osteoarthritis. Narrowing of the subarticular lateral recess on the right and the intervertebral foramen on the right that could possibly be  associated with right-sided nerve compression. Solid bridging osteophytes of the sacroiliac joints. IMPRESSION: No acute or traumatic lumbar finding. Degenerative disc disease and degenerative facet disease at L4-5. Mild stenosis of the lateral recesses and foramina at L4-5. Right subarticular lateral recess and foraminal narrowing at L5-S1 Chronic bridging sacroiliac osteophytes. Electronically Signed   By: Nelson Chimes M.D.   On: 08/03/2018 20:44    Pending Labs Unresulted Labs (From admission, onward)    Start     Ordered   08/03/18 2121  SARS Coronavirus 2 (CEPHEID - Performed in [facility] hospital lab), Hosp Order  (Asymptomatic Patients Labs)  Once,   STAT    Question:  Rule Out  Answer:  Yes   08/03/18 2120   08/03/18 1442  Urinalysis, Routine w reflex microscopic  ONCE - STAT,   STAT     08/03/18 1442          Vitals/Pain Today's Vitals   08/03/18 1830 08/03/18 1904 08/03/18 1930 08/03/18 2000  BP: (!) 132/54 (!) 143/52 (!) 142/55 (!) 145/55  Pulse:  (!) 55 (!) 44 (!) 44  Resp: 16 17 14 16   Temp:      TempSrc:      SpO2:  100% 100% 100%  PainSc:         Isolation Precautions No active isolations  Medications Medications  sodium chloride flush (NS) 0.9 % injection 3 mL (has no administration in time range)  sodium chloride 0.9 % bolus 500 mL (0 mLs Intravenous Stopped 08/03/18 2003)    Mobility walks Low fall risk   Focused Assessments Cardiac Assessment Handoff:  Cardiac Rhythm: Sinus bradycardia No results found for: CKTOTAL, CKMB, CKMBINDEX, TROPONINI No results found for: DDIMER Does the Patient currently have chest pain? No      R Recommendations: See Admitting Provider Note  Report given to:   Additional Notes:

## 2018-08-03 NOTE — ED Notes (Signed)
Attempted to place NGT on right nostril; 16 fr. Pt unable to tolerate. Noted HR increased; Sp02 remain stable on RA; Pt attempted to tolerate for a few mins; unable to do so. NGT is removed.

## 2018-08-03 NOTE — H&P (Signed)
History and Physical    Daniel Odom WSF:[id number] DOB: September 04, 1935 DOA: 08/03/2018  PCP: [facility]  Patient coming from: Home.  Chief Complaint: Abdominal pain.  HPI: Daniel Odom is a 83 y.o. male with history of colon cancer status post right hemicolectomy, history of prostate cancer, diabetes mellitus type 2, hypertension, chronic kidney disease stage III, anemia, sleep apnea presents to the ER with abdominal pain which is been diffuse over the last 24 hours with nausea and has not moved bowels for last 24 hours and has not had any flatus.  Patient states he had a similar episode in 2007.  Due to worsening symptoms patient came to the ER.  Over the last 24 hours patient also had some difficulty urinating.  ED Course: In the ER patient had a CT abdomen pelvis which shows features consistent with small bowel obstruction.  On-call general surgery was consulted patient was placed on NG tube admitted for small bowel obstruction.  Patient also has some difficulty urinating.  Labs revealed hemoglobin 9.4 platelets 239 creatinine 2.1.  Last creatinine last year was around 1.8.  Review of Systems: As per HPI, rest all negative.   Past Medical History:  Diagnosis Date  . Diabetes mellitus without complication ([place])   . Hypertension     Past Surgical History:  Procedure Laterality Date  . COLON SURGERY       reports that he has never smoked. He has never used smokeless tobacco. No history on file for alcohol and drug.  Allergies  Allergen Reactions  . Penicillins Hives    Did it involve swelling of the face/tongue/throat, SOB, or low BP? Yes Did it involve sudden or severe rash/hives, skin peeling, or any reaction on the inside of your mouth or nose? No Did you need to seek medical attention at a hospital or doctor's office? Yes When did it last happen?1968 If all above answers are "NO", may proceed with cephalosporin use.     Family History  Problem Relation Age  of Onset  . Diabetes Mellitus II Brother   . Colon cancer Neg Hx     Prior to Admission medications   Medication Sig Start Date End Date Taking? Authorizing Provider  Albuterol Sulfate 108 (90 Base) MCG/ACT AEPB Inhale 2 puffs into the lungs every 6 (six) hours as needed for wheezing.   Yes [provider]  aspirin 81 MG chewable tablet Chew 81 mg by mouth daily.   Yes [provider]  atorvastatin (LIPITOR) 80 MG tablet Take 80 mg by mouth at bedtime.   Yes [provider]  CALCIUM PO Take 1 tablet by mouth 2 (two) times a day.   Yes [provider]  Carboxymethylcellulose Sod PF (REFRESH PLUS) 0.5 % SOLN Place 1 drop into both eyes 2 (two) times daily as needed (dry eyes).    Yes [provider]  Cholecalciferol (VITAMIN D-1000 MAX ST) 25 MCG (1000 UT) tablet Take 1,000 Units by mouth daily.   Yes [provider]  cyclobenzaprine (FLEXERIL) 10 MG tablet Take 10 mg by mouth 3 (three) times daily as needed for muscle spasms.   Yes [provider]  fluticasone (FLONASE) 50 MCG/ACT nasal spray Place 1 spray into both nostrils daily as needed for allergies or rhinitis.   Yes [provider]  insulin NPH-regular Human (NOVOLIN 70/30) (70-30) 100 UNIT/ML injection Inject 7-30 Units into the skin See admin instructions. 7u in the AM and 30 in the evening   Yes  [provider]  lisinopril-hydrochlorothiazide (ZESTORETIC) 20-12.5 MG tablet Take 1 tablet by mouth daily.   Yes [provider]  mometasone (ASMANEX) 220 MCG/INH inhaler Inhale 2 puffs into the lungs at bedtime.   Yes [provider]  polyethylene glycol (MIRALAX / GLYCOLAX) 17 g packet Take 17 g by mouth 2 (two) times a day. 02/16/17  Yes [provider]  primidone (MYSOLINE) 50 MG tablet Take 100-150 mg by mouth See admin instructions. 100mg  in the AM and 150mg  PM   Yes [provider]    Physical Exam: Constitutional:  Moderately built and nourished. Vitals:   08/03/18 1904 08/03/18 1930 08/03/18 2000 08/03/18 2145  BP: (!) 143/52 (!) 142/55 (!) 145/55 (!) 81/59  Pulse: (!) 55 (!) 44 (!) 44 91  Resp: 17 14 16 16   Temp:      TempSrc:      SpO2: 100% 100% 100% 99%   Eyes: Anicteric no pallor. ENMT: No discharge from the ears eyes nose or mouth. Neck: No mass felt.  No neck rigidity. Respiratory: No rhonchi or crepitations. Cardiovascular: S1-S2 heard. Abdomen: Soft distended bowel sounds not appreciated no guarding rigidity. Musculoskeletal: No edema. Skin: No rash. Neurologic: Alert awake oriented to time place and person.  Moves all extremities. Psychiatric: Appears normal.  Normal affect.   Labs on Admission: I have personally reviewed following labs and imaging studies  CBC: Recent Labs  Lab 08/03/18 1445  WBC 5.9  HGB 9.4*  HCT 27.4*  MCV 82.8  PLT 932   Basic Metabolic Panel: Recent Labs  Lab 08/03/18 1445  NA 137  K 4.6  CL 104  CO2 23  GLUCOSE 199*  BUN 28*  CREATININE 2.13*  CALCIUM 8.6*   GFR: CrCl cannot be calculated (Unknown ideal weight.). Liver Function Tests: Recent Labs  Lab 08/03/18 1445  AST 19  ALT 15  ALKPHOS 42  BILITOT 0.9  PROT 6.9  ALBUMIN 3.7   Recent Labs  Lab 08/03/18 1445  LIPASE 31   No results for input(s): AMMONIA in the last 168 hours. Coagulation Profile: No results for input(s): INR, PROTIME in the last 168 hours. Cardiac Enzymes: No results for input(s): CKTOTAL, CKMB, CKMBINDEX, TROPONINI in the last 168 hours. BNP (last 3 results) No results for input(s): PROBNP in the last 8760 hours. HbA1C: No results for input(s): HGBA1C in the last 72 hours. CBG: No results for input(s): GLUCAP in the last 168 hours. Lipid Profile: No results for input(s): CHOL, HDL, LDLCALC, TRIG, CHOLHDL, LDLDIRECT in the last 72 hours. Thyroid Function Tests: No results for input(s): TSH, T4TOTAL, FREET4, T3FREE, THYROIDAB in the last 72 hours.  Anemia Panel: No results for input(s): VITAMINB12, FOLATE, FERRITIN, TIBC, IRON, RETICCTPCT in the last 72 hours. Urine analysis: No results found for: COLORURINE, APPEARANCEUR, LABSPEC, PHURINE, GLUCOSEU, HGBUR, BILIRUBINUR, KETONESUR, PROTEINUR, UROBILINOGEN, NITRITE, LEUKOCYTESUR Sepsis Labs: @LABRCNTIP (procalcitonin:4,lacticidven:4) ) Recent Results (from the past 240 hour(s))  SARS Coronavirus 2 (CEPHEID - Performed in [place] hospital lab), Hosp Order     Status: None   Collection Time: 08/03/18  9:21 PM   Specimen: Nasopharyngeal Swab  Result Value Ref Range Status   SARS Coronavirus 2 NEGATIVE NEGATIVE Final    Comment: (NOTE) If result is NEGATIVE SARS-CoV-2 target nucleic acids are NOT DETECTED. The SARS-CoV-2 RNA is generally detectable in upper and lower  respiratory specimens during the acute phase of infection. The lowest  concentration of SARS-CoV-2 viral copies this assay can detect is 250  copies /  mL. A negative result does not preclude SARS-CoV-2 infection  and should not be used as the sole basis for treatment or other  patient management decisions.  A negative result may occur with  improper specimen collection / handling, submission of specimen other  than nasopharyngeal swab, presence of viral mutation(s) within the  areas targeted by this assay, and inadequate number of viral copies  (<250 copies / mL). A negative result must be combined with clinical  observations, patient history, and epidemiological information. If result is POSITIVE SARS-CoV-2 target nucleic acids are DETECTED. The SARS-CoV-2 RNA is generally detectable in upper and lower  respiratory specimens dur ing the acute phase of infection.  Positive  results are indicative of active infection with SARS-CoV-2.  Clinical  correlation with patient history and other diagnostic information is  necessary to determine patient infection status.  Positive results do  not rule out bacterial infection  or co-infection with other viruses. If result is PRESUMPTIVE POSTIVE SARS-CoV-2 nucleic acids MAY BE PRESENT.   A presumptive positive result was obtained on the submitted specimen  and confirmed on repeat testing.  While 2019 novel coronavirus  (SARS-CoV-2) nucleic acids may be present in the submitted sample  additional confirmatory testing may be necessary for epidemiological  and / or clinical management purposes  to differentiate between  SARS-CoV-2 and other Sarbecovirus currently known to infect humans.  If clinically indicated additional testing with an alternate test  methodology [phone]) is advised. The SARS-CoV-2 RNA is generally  detectable in upper and lower respiratory sp ecimens during the acute  phase of infection. The expected result is Negative. Fact Sheet for Patients:  StrictlyIdeas.no Fact Sheet for Healthcare Providers: BankingDealers.co.za This test is not yet approved or cleared by the [place] FDA and has been authorized for detection and/or diagnosis of SARS-CoV-2 by FDA under an Emergency Use Authorization (EUA).  This EUA will remain in effect (meaning this test can be used) for the duration of the COVID-19 declaration under Section 564(b)(1) of the Act, 21 U.S.C. section 360bbb-3(b)(1), unless the authorization is terminated or revoked sooner. Performed at [facility], [place] [address], [place], [place] [address]      Radiological Exams on Admission: Ct Abdomen Pelvis Wo Contrast  Result Date: 08/03/2018 CLINICAL DATA:  History of small bowel obstruction. Recurrent symptoms. EXAM: CT ABDOMEN AND PELVIS WITHOUT CONTRAST TECHNIQUE: Multidetector CT imaging of the abdomen and pelvis was performed following the standard protocol without IV contrast. COMPARISON:  None. FINDINGS: Lower chest: Normal Hepatobiliary: Liver parenchyma is normal.  No calcified gallstones. Pancreas: Normal Spleen: Normal  Adrenals/Urinary Tract: Adrenal glands show mild hyperplasia. No focal renal lesion. The kidneys are slightly small. No obstruction. Bladder appears normal. Stomach/Bowel: Acute small bowel obstruction with dilated fluid and air-filled small intestine. The distal small bowel is collapsed. There is ascites, often seen with small-bowel obstruction. There is sigmoid diverticulosis without evidence of diverticulitis. Previous colon anastomosis in the region of the hepatic flexure colon is patulous at that level, possibly due to denervation. Vascular/Lymphatic: Aortic atherosclerosis. No aneurysm. IVC is normal. No retroperitoneal adenopathy. Reproductive: Multiple prostate seed implants. Other: No free air.  Ascites as noted above. Musculoskeletal: Ordinary lower lumbar degenerative changes. IMPRESSION: Small-bowel obstruction in about the midportion. Partial or early. Distal small bowel is not dilated. Stool in the colon. Previous colon resection/anastomosis in the hepatic flexure region. Small amount of ascites presumably associated with the small bowel obstruction. No free air. Electronically Signed   By: Elta Guadeloupe  Shogry M.D.   On: 08/03/2018 20:50   Ct L-spine No Charge  Result Date: 08/03/2018 CLINICAL DATA:  Abdominal pain.  Fall with back pain. EXAM: CT LUMBAR SPINE WITHOUT CONTRAST TECHNIQUE: Multidetector CT imaging of the lumbar spine was performed without intravenous contrast administration. Multiplanar CT image reconstructions were also generated. COMPARISON:  None. FINDINGS: Segmentation: 5 lumbar type vertebral bodies. Alignment: Straightening of the normal lumbar lordosis. Vertebrae: No fracture or primary bone lesion. Paraspinal and other soft tissues: See results of abdominal CT. Disc levels: No significant disc space finding L3-4 or above. Minimal disc bulges. Ordinary facet osteoarthritis. No compressive canal or foraminal narrowing. L4-5: Moderate bulging of the disc. Mild facet degeneration. Mild  stenosis the lateral recesses and foramina. L5-S1: Disc degeneration with disc space narrowing. Chronic endplate osteophytes more prominent towards the right. Facet osteoarthritis. Narrowing of the subarticular lateral recess on the right and the intervertebral foramen on the right that could possibly be associated with right-sided nerve compression. Solid bridging osteophytes of the sacroiliac joints. IMPRESSION: No acute or traumatic lumbar finding. Degenerative disc disease and degenerative facet disease at L4-5. Mild stenosis of the lateral recesses and foramina at L4-5. Right subarticular lateral recess and foraminal narrowing at L5-S1 Chronic bridging sacroiliac osteophytes. Electronically Signed   By: Nelson Chimes M.D.   On: 08/03/2018 20:44    EKG: Independently reviewed.  Normal sinus rhythm.  Assessment/Plan Principal Problem:   SBO (small bowel obstruction) (HCC) Active Problems:   History of colon cancer   Diabetes mellitus type 2 in obese [facility])   Essential hypertension   CKD (chronic kidney disease) stage 3, GFR 30-59 ml/min (HCC)   Anemia associated with chronic renal failure    1. Small bowel obstruction -NG tube has been ordered.  We will keep patient n.p.o. IV fluids pain relief medications in the surgery consult. 2. Hypertension we will keep patient on PRN IV hydralazine.  Since patient is n.p.o. 3. Diabetes mellitus type 2 we will keep patient on sliding scale coverage.  Closely follow CBGs. 4. Chronic kidney disease stage III creatinine elevated from last year. 5. Anemia likely from renal disease.  Patient states he has known history of anemia.  Follow CBC. 6. Difficulty to urinate -we will check bladder scan and if has persistent retention will need catheter. 7. Sleep apnea CPAP. 8. History of colon cancer status post right hemicolectomy and history of prostate cancer.  Given the complexity of the condition including bowel obstruction urinary retention and multiple other  comorbidities including diabetes hypertension patient will need inpatient status.   DVT prophylaxis: Heparin. Code Status: Full code. Family Communication: Discussed with patient. Disposition Plan: Home. Consults called: General surgery. Admission status: Inpatient.   Rise Patience MD [facility] Pager [phone].  If 7PM-7AM, please contact night-coverage [email or web address] Password [email or web address]  08/03/2018, 11:09 PM

## 2018-08-04 ENCOUNTER — Inpatient Hospital Stay (HOSPITAL_COMMUNITY): Payer: No Typology Code available for payment source

## 2018-08-04 DIAGNOSIS — E1169 Type 2 diabetes mellitus with other specified complication: Secondary | ICD-10-CM

## 2018-08-04 DIAGNOSIS — D631 Anemia in chronic kidney disease: Secondary | ICD-10-CM

## 2018-08-04 DIAGNOSIS — N183 Chronic kidney disease, stage 3 (moderate): Secondary | ICD-10-CM

## 2018-08-04 DIAGNOSIS — K56609 Unspecified intestinal obstruction, unspecified as to partial versus complete obstruction: Secondary | ICD-10-CM

## 2018-08-04 DIAGNOSIS — E669 Obesity, unspecified: Secondary | ICD-10-CM

## 2018-08-04 DIAGNOSIS — N189 Chronic kidney disease, unspecified: Secondary | ICD-10-CM

## 2018-08-04 DIAGNOSIS — I10 Essential (primary) hypertension: Secondary | ICD-10-CM

## 2018-08-04 LAB — COMPREHENSIVE METABOLIC PANEL
ALT: 16 U/L (ref 0–44)
AST: 22 U/L (ref 15–41)
Albumin: 3.6 g/dL (ref 3.5–5.0)
Alkaline Phosphatase: 43 U/L (ref 38–126)
Anion gap: 11 (ref 5–15)
BUN: 31 mg/dL — ABNORMAL HIGH (ref 8–23)
CO2: 22 mmol/L (ref 22–32)
Calcium: 8.5 mg/dL — ABNORMAL LOW (ref 8.9–10.3)
Chloride: 105 mmol/L (ref 98–111)
Creatinine, Ser: 2 mg/dL — ABNORMAL HIGH (ref 0.61–1.24)
GFR calc Af Amer: 35 mL/min — ABNORMAL LOW (ref >60)
GFR calc non Af Amer: 30 mL/min — ABNORMAL LOW (ref >60)
Glucose, Bld: 186 mg/dL — ABNORMAL HIGH (ref 70–99)
Potassium: 4.2 mmol/L (ref 3.5–5.1)
Sodium: 138 mmol/L (ref 135–145)
Total Bilirubin: 0.8 mg/dL (ref 0.3–1.2)
Total Protein: 6.7 g/dL (ref 6.5–8.1)

## 2018-08-04 LAB — CBC WITH DIFFERENTIAL/PLATELET
Abs Immature Granulocytes: 0.02 10*3/uL (ref 0.00–0.07)
Basophils Absolute: 0 10*3/uL (ref 0.0–0.1)
Basophils Relative: 0 %
Eosinophils Absolute: 0.1 10*3/uL (ref 0.0–0.5)
Eosinophils Relative: 1 %
HCT: 28.1 % — ABNORMAL LOW (ref 39.0–52.0)
Hemoglobin: 10 g/dL — ABNORMAL LOW (ref 13.0–17.0)
Immature Granulocytes: 0 %
Lymphocytes Relative: 19 %
Lymphs Abs: 1.3 10*3/uL (ref 0.7–4.0)
MCH: 28.9 pg (ref 26.0–34.0)
MCHC: 35.6 g/dL (ref 30.0–36.0)
MCV: 81.2 fL (ref 80.0–100.0)
Monocytes Absolute: 0.5 10*3/uL (ref 0.1–1.0)
Monocytes Relative: 8 %
Neutro Abs: 4.8 10*3/uL (ref 1.7–7.7)
Neutrophils Relative %: 72 %
Platelets: 254 10*3/uL (ref 150–400)
RBC: 3.46 MIL/uL — ABNORMAL LOW (ref 4.22–5.81)
RDW: 14.6 % (ref 11.5–15.5)
WBC: 6.6 10*3/uL (ref 4.0–10.5)
nRBC: 0 % (ref 0.0–0.2)

## 2018-08-04 LAB — GLUCOSE, CAPILLARY
Glucose-Capillary: 145 mg/dL — ABNORMAL HIGH (ref 70–99)
Glucose-Capillary: 169 mg/dL — ABNORMAL HIGH (ref 70–99)
Glucose-Capillary: 171 mg/dL — ABNORMAL HIGH (ref 70–99)
Glucose-Capillary: 176 mg/dL — ABNORMAL HIGH (ref 70–99)
Glucose-Capillary: 178 mg/dL — ABNORMAL HIGH (ref 70–99)
Glucose-Capillary: 178 mg/dL — ABNORMAL HIGH (ref 70–99)

## 2018-08-04 MED ORDER — KCL IN DEXTROSE-NACL 20-5-0.9 MEQ/L-%-% IV SOLN
INTRAVENOUS | Status: DC
  Administered 2018-08-04 – 2018-08-05 (×2): via INTRAVENOUS
  Filled 2018-08-04 (×2): qty 1000

## 2018-08-04 MED ORDER — HEPARIN SODIUM (PORCINE) 5000 UNIT/ML IJ SOLN
5000.0000 [IU] | Freq: Three times a day (TID) | INTRAMUSCULAR | Status: DC
  Administered 2018-08-04 – 2018-08-17 (×37): 5000 [IU] via SUBCUTANEOUS
  Filled 2018-08-04 (×38): qty 1

## 2018-08-04 NOTE — Progress Notes (Signed)
PROGRESS NOTE  Daniel Odom JQG:[id number] DOB: 01/19/1936 DOA: 08/03/2018 [facility]CP: [facility]  Brief History   83 year old man PMH colon cancer status post right hemicolectomy presented with abdominal pain.  Admitted for small bowel obstruction.  A & P  Small bowel obstruction.  PMH colon cancer status post right hemicolectomy. --Passing flatus but no bowel movement yet.  Hemodynamic stable.  No leukocytosis.  No fever.  Continue management as per general surgery.  Mobitz 1, sinus rhythm with frequent ectopy --No evidence of atrial fibrillation on EKG or on telemetry --Rhythm benign, continue to monitor.  Diabetes mellitus type 2 --Stable.  Continue sliding scale insulin.  CKD stage III --Suspect at baseline.  Creatinine was 1.87 in February 07, 2017.  Anemia, likely anemia from CKD. --Appears stable.  No further evaluation suggested.  Essential hypertension --Stable.  OSA --Continue CPAP each night   Continue management per general surgery   DVT prophylaxis: heparin, SCDs Code Status: Full Family Communication: none Disposition Plan: home    Daniel [name], MD  [facility] Direct contact: see [email or web address] (further directions at bottom of note if needed) 7PM-7AM contact night coverage as at bottom of note 08/04/2018, 4:29 PM  LOS: 1 day   Consultants  . General surgery  Procedures  .   Antibiotics  .   Interval History/Subjective  Feels okay, no pain, no vomiting.  Passing gas.  No history of arrhythmias. Overnight RN documented Mobitz 2.  Objective   Vitals:  Vitals:   08/04/18 0451 08/04/18 1457  BP: 100/67 137/71  Pulse: 89 79  Resp: 14 15  Temp: 98.4 F (36.9 C) 98.4 F (36.9 C)  SpO2: 98% 98%    Exam:  Constitutional:  . Appears calm and comfortable Respiratory:  . CTA bilaterally, no w/r/r.  . Respiratory effort normal.  Cardiovascular:  . RRR, no m/r/g . No LE extremity edema   . Telemetry Mobitz 1, sinus rhythm,  sinus arrhythmia, frequent ectopy Abdomen:  . Soft Psychiatric:  . Mental status o Mood, affect appropriate  I have personally reviewed the following:   Today's Data  . Telemetry shows Mobitz 1.  No Mobitz 2.  Strip in question shows sinus rhythm with PACs.  Telemetry without evidence of high-grade AV block.  No definite evidence of atrial fibrillation. . CBG stable . Creatinine stable at 2.00.  BUN without significant change, 31.  Potassium within normal limits.  LFTs unremarkable. . Hemoglobin stable at 10.0.  No leukocytosis. Marland Kitchen SARS-CoV-2 negative  Lab Data  . Reviewed  Micro Data  .   Imaging  . CT abdomen pelvis reviewed.  Cardiology Data  . Independent review: EKG 7/9 shows sinus rhythm with first-degree AV block.  EKG 7/10 narrow complex rhythm within normal rate.  Not atrial fibrillation.  No evidence of flutter.  Suspect sinus rhythm.  Other Data  .   Scheduled Meds: . heparin injection (subcutaneous)  5,000 Units Subcutaneous Q8H  . insulin aspart  0-9 Units Subcutaneous Q4H  . sodium chloride flush  3 mL Intravenous Once   Continuous Infusions: . dextrose 5 % and 0.9 % NaCl with KCl 20 mEq/L 75 mL/hr at 08/04/18 1112    Principal Problem:   SBO (small bowel obstruction) (HCC) Active Problems:   History of colon cancer   Diabetes mellitus type 2 in obese (HCC)   Essential hypertension   CKD (chronic kidney disease) stage 3, GFR 30-59 ml/min (HCC)   Anemia associated with chronic renal failure   LOS: 1  day   How to contact the [facility] Attending or Consulting provider [place] or covering provider during after hours [place], for this patient?  1. Check the care team in [facility] and look for a) attending/consulting [facility] provider listed and b) the [facility] team listed 2. Log into [email or web address] and use [place] universal password to access. If you do not have the password, please contact the hospital operator. 3. Locate the Operating Room Services provider you are looking for under [facility] and page to a number that you can be directly reached. 4. If you still have difficulty reaching the provider, please page the [facility] (Director on Call) for the Hospitalists listed on amion for assistance.

## 2018-08-04 NOTE — Plan of Care (Signed)
  Problem: Education: Goal: Knowledge of General Education information will improve Description Including pain rating scale, medication(s)/side effects and non-pharmacologic comfort measures Outcome: Progressing   

## 2018-08-04 NOTE — Progress Notes (Signed)
Call from central telemetry stating patient in second degree heart block. EKG taken of patient. Results printed and notified on call MD Blount of results. Will continue to monitor patient.

## 2018-08-04 NOTE — Progress Notes (Signed)
[facility]/Trauma Progress Note      Assessment/Plan history of colon cancer status post right hemicolectomy history of prostate cancer diabetes mellitus type 2 Hypertension chronic kidney disease stage III  SBO - On protocol, 8 hr delay film today around 1000 - we will follow - no abdominal pain this am and having flatus  FEN: NPO, NGT with dark bilious output VTE: SCD's, heparin ID: none Foley: none Follow up: TBD    LOS: 1 day    Subjective: CC: SBO  No abdominal pain this am. Had some flatus overnight no BM. Dry heaves with the NGT in.   Objective: Vital signs in last 24 hours: Temp:  [98 F (36.7 C)-98.4 F (36.9 C)] 98.4 F (36.9 C) (07/10 0451) Pulse Rate:  [44-91] 89 (07/10 0451) Resp:  [14-18] 14 (07/10 0451) BP: (81-149)/(52-120) 100/67 (07/10 0451) SpO2:  [98 %-100 %] 98 % (07/10 0451) Weight:  [121.3 kg-121.5 kg] 121.3 kg (07/10 0716)    Intake/Output from previous day: 07/09 0701 - 07/10 0700 In: 0  Out: 220 [Urine:220] Intake/Output this shift: No intake/output data recorded.  PE: Gen:  Alert, NAD, pleasant, cooperative Pulm:  CTA, no W/R/R, effort normal Abd: Soft, obese, NT/ND, hypoactive BS, no HSM, well healed previous midline scar Skin: no rashes noted, warm and dry   Anti-infectives: Anti-infectives (From admission, onward)   None      Lab Results:  Recent Labs    08/03/18 1445 08/04/18 0746  WBC 5.9 6.6  HGB 9.4* 10.0*  HCT 27.4* 28.1*  PLT 239 254   BMET Recent Labs    08/03/18 1445 08/04/18 0746  NA 137 138  K 4.6 4.2  CL 104 105  CO2 23 22  GLUCOSE 199* 186*  BUN 28* 31*  CREATININE 2.13* 2.00*  CALCIUM 8.6* 8.5*   PT/INR No results for input(s): LABPROT, INR in the last 72 hours. CMP     Component Value Date/Time   NA 138 08/04/2018 0746   K 4.2 08/04/2018 0746   CL 105 08/04/2018 0746   CO2 22 08/04/2018 0746   GLUCOSE 186 (H) 08/04/2018 0746   BUN 31 (H) 08/04/2018 0746    CREATININE 2.00 (H) 08/04/2018 0746   CALCIUM 8.5 (L) 08/04/2018 0746   PROT 6.7 08/04/2018 0746   ALBUMIN 3.6 08/04/2018 0746   AST 22 08/04/2018 0746   ALT 16 08/04/2018 0746   ALKPHOS 43 08/04/2018 0746   BILITOT 0.8 08/04/2018 0746   GFRNONAA 30 (L) 08/04/2018 0746   GFRAA 35 (L) 08/04/2018 0746   Lipase     Component Value Date/Time   LIPASE 31 08/03/2018 1445    Studies/Results: Ct Abdomen Pelvis Wo Contrast  Result Date: 08/03/2018 CLINICAL DATA:  History of small bowel obstruction. Recurrent symptoms. EXAM: CT ABDOMEN AND PELVIS WITHOUT CONTRAST TECHNIQUE: Multidetector CT imaging of the abdomen and pelvis was performed following the standard protocol without IV contrast. COMPARISON:  None. FINDINGS: Lower chest: Normal Hepatobiliary: Liver parenchyma is normal.  No calcified gallstones. Pancreas: Normal Spleen: Normal Adrenals/Urinary Tract: Adrenal glands show mild hyperplasia. No focal renal lesion. The kidneys are slightly small. No obstruction. Bladder appears normal. Stomach/Bowel: Acute small bowel obstruction with dilated fluid and air-filled small intestine. The distal small bowel is collapsed. There is ascites, often seen with small-bowel obstruction. There is sigmoid diverticulosis without evidence of diverticulitis. Previous colon anastomosis in the region of the hepatic flexure colon is patulous at that level, possibly due to denervation. Vascular/Lymphatic: Aortic atherosclerosis.  No aneurysm. IVC is normal. No retroperitoneal adenopathy. Reproductive: Multiple prostate seed implants. Other: No free air.  Ascites as noted above. Musculoskeletal: Ordinary lower lumbar degenerative changes. IMPRESSION: Small-bowel obstruction in about the midportion. Partial or early. Distal small bowel is not dilated. Stool in the colon. Previous colon resection/anastomosis in the hepatic flexure region. Small amount of ascites presumably associated with the small bowel obstruction. No free  air. Electronically Signed   By: Nelson Chimes M.D.   On: 08/03/2018 20:50   Ct L-spine No Charge  Result Date: 08/03/2018 CLINICAL DATA:  Abdominal pain.  Fall with back pain. EXAM: CT LUMBAR SPINE WITHOUT CONTRAST TECHNIQUE: Multidetector CT imaging of the lumbar spine was performed without intravenous contrast administration. Multiplanar CT image reconstructions were also generated. COMPARISON:  None. FINDINGS: Segmentation: 5 lumbar type vertebral bodies. Alignment: Straightening of the normal lumbar lordosis. Vertebrae: No fracture or primary bone lesion. Paraspinal and other soft tissues: See results of abdominal CT. Disc levels: No significant disc space finding L3-4 or above. Minimal disc bulges. Ordinary facet osteoarthritis. No compressive canal or foraminal narrowing. L4-5: Moderate bulging of the disc. Mild facet degeneration. Mild stenosis the lateral recesses and foramina. L5-S1: Disc degeneration with disc space narrowing. Chronic endplate osteophytes more prominent towards the right. Facet osteoarthritis. Narrowing of the subarticular lateral recess on the right and the intervertebral foramen on the right that could possibly be associated with right-sided nerve compression. Solid bridging osteophytes of the sacroiliac joints. IMPRESSION: No acute or traumatic lumbar finding. Degenerative disc disease and degenerative facet disease at L4-5. Mild stenosis of the lateral recesses and foramina at L4-5. Right subarticular lateral recess and foraminal narrowing at L5-S1 Chronic bridging sacroiliac osteophytes. Electronically Signed   By: Nelson Chimes M.D.   On: 08/03/2018 20:44   Dg Abd Portable 1v-small Bowel Protocol-position Verification  Result Date: 08/04/2018 CLINICAL DATA:  Check NG tube placement EXAM: PORTABLE ABDOMEN - 1 VIEW COMPARISON:  None. FINDINGS: Scattered large and small bowel gas is noted. Diffuse small bowel dilatation is noted similar to that seen on recent CT examination.  Gastric catheter is been placed with the tip in the stomach. Proximal side port lies in the distal esophagus. This should be advanced several cm further into the stomach. IMPRESSION: Nasogastric catheter with tip in stomach. This should be advanced several cm to allow the proximal side port to lie within the stomach as well. Persistent small bowel dilatation. Electronically Signed   By: Inez Catalina M.D.   On: 08/04/2018 00:00      Kalman Drape , [facility] 08/04/2018, 8:51 AM  Pager: [phone] Mon-Wed, Friday 7:00am-4:30pm Thurs 7am-11:30am  Consults: [phone]

## 2018-08-05 ENCOUNTER — Inpatient Hospital Stay (HOSPITAL_COMMUNITY): Payer: No Typology Code available for payment source

## 2018-08-05 DIAGNOSIS — E119 Type 2 diabetes mellitus without complications: Secondary | ICD-10-CM

## 2018-08-05 DIAGNOSIS — I441 Atrioventricular block, second degree: Secondary | ICD-10-CM

## 2018-08-05 DIAGNOSIS — I10 Essential (primary) hypertension: Secondary | ICD-10-CM | POA: Diagnosis present

## 2018-08-05 LAB — BASIC METABOLIC PANEL
Anion gap: 9 (ref 5–15)
BUN: 28 mg/dL — ABNORMAL HIGH (ref 8–23)
CO2: 23 mmol/L (ref 22–32)
Calcium: 8.6 mg/dL — ABNORMAL LOW (ref 8.9–10.3)
Chloride: 106 mmol/L (ref 98–111)
Creatinine, Ser: 1.78 mg/dL — ABNORMAL HIGH (ref 0.61–1.24)
GFR calc Af Amer: 41 mL/min — ABNORMAL LOW (ref >60)
GFR calc non Af Amer: 35 mL/min — ABNORMAL LOW (ref >60)
Glucose, Bld: 200 mg/dL — ABNORMAL HIGH (ref 70–99)
Potassium: 3.9 mmol/L (ref 3.5–5.1)
Sodium: 138 mmol/L (ref 135–145)

## 2018-08-05 LAB — CBC
HCT: 30.6 % — ABNORMAL LOW (ref 39.0–52.0)
Hemoglobin: 10.8 g/dL — ABNORMAL LOW (ref 13.0–17.0)
MCH: 28.7 pg (ref 26.0–34.0)
MCHC: 35.3 g/dL (ref 30.0–36.0)
MCV: 81.4 fL (ref 80.0–100.0)
Platelets: 269 10*3/uL (ref 150–400)
RBC: 3.76 MIL/uL — ABNORMAL LOW (ref 4.22–5.81)
RDW: 14.4 % (ref 11.5–15.5)
WBC: 6.7 10*3/uL (ref 4.0–10.5)
nRBC: 0 % (ref 0.0–0.2)

## 2018-08-05 LAB — GLUCOSE, CAPILLARY
Glucose-Capillary: 160 mg/dL — ABNORMAL HIGH (ref 70–99)
Glucose-Capillary: 169 mg/dL — ABNORMAL HIGH (ref 70–99)
Glucose-Capillary: 172 mg/dL — ABNORMAL HIGH (ref 70–99)
Glucose-Capillary: 202 mg/dL — ABNORMAL HIGH (ref 70–99)
Glucose-Capillary: 211 mg/dL — ABNORMAL HIGH (ref 70–99)
Glucose-Capillary: 222 mg/dL — ABNORMAL HIGH (ref 70–99)

## 2018-08-05 MED ORDER — PROCHLORPERAZINE EDISYLATE 10 MG/2ML IJ SOLN: 5.0000 mg | INTRAMUSCULAR | Status: DC | PRN

## 2018-08-05 MED ORDER — SODIUM CHLORIDE 0.9 % IV SOLN
25.0000 mg | Freq: Four times a day (QID) | INTRAVENOUS | Status: DC | PRN
  Filled 2018-08-05: qty 1

## 2018-08-05 MED ORDER — METHOCARBAMOL 1000 MG/10ML IJ SOLN
1000.0000 mg | Freq: Four times a day (QID) | INTRAVENOUS | Status: DC | PRN
  Filled 2018-08-05: qty 10

## 2018-08-05 MED ORDER — HYDROCORTISONE 1 % EX CREA: 1.0000 "application " | TOPICAL_CREAM | Freq: Three times a day (TID) | CUTANEOUS | Status: DC | PRN

## 2018-08-05 MED ORDER — ALUM & MAG HYDROXIDE-SIMETH 200-200-20 MG/5ML PO SUSP
30.0000 mL | Freq: Four times a day (QID) | ORAL | Status: DC | PRN
  Filled 2018-08-05: qty 30

## 2018-08-05 MED ORDER — METOPROLOL TARTRATE 5 MG/5ML IV SOLN: 5.0000 mg | Freq: Four times a day (QID) | INTRAVENOUS | Status: DC | PRN

## 2018-08-05 MED ORDER — DIPHENHYDRAMINE HCL 50 MG/ML IJ SOLN
12.5000 mg | Freq: Four times a day (QID) | INTRAMUSCULAR | Status: DC | PRN
  Administered 2018-08-10: 25 mg via INTRAVENOUS
  Filled 2018-08-05: qty 1

## 2018-08-05 MED ORDER — SODIUM CHLORIDE 0.9 % IV SOLN
8.0000 mg | Freq: Four times a day (QID) | INTRAVENOUS | Status: DC | PRN
  Administered 2018-08-11: 8 mg via INTRAVENOUS
  Filled 2018-08-05 (×2): qty 4

## 2018-08-05 MED ORDER — GUAIFENESIN-DM 100-10 MG/5ML PO SYRP: 10.0000 mL | ORAL_SOLUTION | ORAL | Status: DC | PRN

## 2018-08-05 MED ORDER — HYDRALAZINE HCL 20 MG/ML IJ SOLN: 5.0000 mg | INTRAMUSCULAR | Status: DC | PRN

## 2018-08-05 MED ORDER — LACTATED RINGERS IV SOLN
INTRAVENOUS | Status: AC
  Administered 2018-08-05 – 2018-08-08 (×5): via INTRAVENOUS

## 2018-08-05 MED ORDER — MAGIC MOUTHWASH
15.0000 mL | Freq: Four times a day (QID) | ORAL | Status: DC | PRN
  Filled 2018-08-05: qty 15

## 2018-08-05 MED ORDER — BISACODYL 10 MG RE SUPP
10.0000 mg | Freq: Every day | RECTAL | Status: DC
  Administered 2018-08-05 – 2018-08-16 (×9): 10 mg via RECTAL
  Filled 2018-08-05 (×11): qty 1

## 2018-08-05 MED ORDER — LIP MEDEX EX OINT
1.0000 "application " | TOPICAL_OINTMENT | Freq: Two times a day (BID) | CUTANEOUS | Status: DC
  Administered 2018-08-05 – 2018-08-17 (×23): 1 via TOPICAL
  Filled 2018-08-05: qty 7

## 2018-08-05 MED ORDER — PHENOL 1.4 % MT LIQD
1.0000 | OROMUCOSAL | Status: DC | PRN
  Administered 2018-08-05: 1 via OROMUCOSAL
  Filled 2018-08-05: qty 177

## 2018-08-05 MED ORDER — LACTATED RINGERS IV BOLUS: 1000.0000 mL | Freq: Three times a day (TID) | INTRAVENOUS | Status: AC | PRN

## 2018-08-05 MED ORDER — ONDANSETRON HCL 4 MG/2ML IJ SOLN
4.0000 mg | Freq: Four times a day (QID) | INTRAMUSCULAR | Status: DC | PRN
  Administered 2018-08-08 – 2018-08-11 (×4): 4 mg via INTRAVENOUS
  Filled 2018-08-05 (×4): qty 2

## 2018-08-05 MED ORDER — HYDROCORTISONE (PERIANAL) 2.5 % EX CREA: 1.0000 "application " | TOPICAL_CREAM | Freq: Four times a day (QID) | CUTANEOUS | Status: DC | PRN

## 2018-08-05 MED ORDER — MENTHOL 3 MG MT LOZG
1.0000 | LOZENGE | OROMUCOSAL | Status: DC | PRN
  Administered 2018-08-05: 13:00:00 3 mg via ORAL
  Filled 2018-08-05: qty 9

## 2018-08-05 NOTE — Progress Notes (Signed)
Patient refused CPAP for tonight. RT instructed patient to have RT called if he changes his mind. RT will monitor as needed. 

## 2018-08-05 NOTE — Progress Notes (Signed)
Pt had a good BM today, states his abd feels much better, had 780 cc from his NG tube today.

## 2018-08-05 NOTE — Progress Notes (Signed)
[facility]/Trauma Progress Note Principal Problem:   SBO (small bowel obstruction) (HCC) Active Problems:   CKD (chronic kidney disease) stage 3, GFR 30-59 ml/min (HCC)   History of colon cancer   Diabetes mellitus type 2 in obese ([place])   Essential hypertension   Anemia associated with chronic renal failure   Hypertension   Diabetes mellitus without complication (HCC)   Mobitz type 1 second degree atrioventricular block   Morbid obesity with body mass index of 40.0-49.9 (HCC)     Assessment/Plan   SBO - On protocol, still with small bowel dilatation and no contrast in colon.   -Continue nasogastric tube decompression.  Follow-up film tomorrow  - we will follow   FEN: NPO, NGT, hyperglycemia on D5.  Switch to LR.  Chronic kidney disease with creatinine back down to 1.7.  VTE: SCD's, heparin ID: none Foley: none Follow up: TBD    LOS: 2 days    Subjective: CC: SBO  Sitting up in chair.  Not much flatus.  No bowel movements.  No nausea.  Objective: Vital signs in last 24 hours: Temp:  [98.4 F (36.9 C)-98.8 F (37.1 C)] 98.8 F (37.1 C) (07/11 0441) Pulse Rate:  [79-86] 86 (07/11 0441) Resp:  [15-16] 16 (07/11 0441) BP: (136-141)/(65-71) 141/71 (07/11 0441) SpO2:  [98 %-99 %] 99 % (07/11 0441) Last BM Date: 08/02/18  Intake/Output from previous day: 07/10 0701 - 07/11 0700 In: 1234.2 [I.V.:1234.2] Out: 2615 [Urine:1215; Emesis/NG output:1400] Intake/Output this shift: Total I/O In: -  Out: 275 [Urine:275]  PE: General: Pt awake/alert/oriented x4 in no major acute distress Eyes: PERRL, normal EOM. Sclera nonicteric Neuro: CN II-XII intact w/o focal sensory/motor deficits. Lymph: No head/neck/groin lymphadenopathy Psych:  No delerium/psychosis/paranoia HENT: Normocephalic, Mucus membranes moist.  No thrush.  NG tube in place Neck: Supple, No tracheal deviation Chest: No pain.  Good respiratory excursion. CV:  Pulses intact.   Regular rhythm MS: Normal AROM mjr joints.  No obvious deformity Abdomen: Obese but soft.  Moderately distended.  Nontender.  No incarcerated hernias. Ext:  SCDs BLE.  No significant edema.  No cyanosis Skin: No petechiae / purpura    Anti-infectives: Anti-infectives (From admission, onward)   None      Lab Results:  Recent Labs    08/04/18 0746 08/05/18 0153  WBC 6.6 6.7  HGB 10.0* 10.8*  HCT 28.1* 30.6*  PLT 254 269   BMET Recent Labs    08/04/18 0746 08/05/18 0153  NA 138 138  K 4.2 3.9  CL 105 106  CO2 22 23  GLUCOSE 186* 200*  BUN 31* 28*  CREATININE 2.00* 1.78*  CALCIUM 8.5* 8.6*   PT/INR No results for input(s): LABPROT, INR in the last 72 hours. CMP     Component Value Date/Time   NA 138 08/05/2018 0153   K 3.9 08/05/2018 0153   CL 106 08/05/2018 0153   CO2 23 08/05/2018 0153   GLUCOSE 200 (H) 08/05/2018 0153   BUN 28 (H) 08/05/2018 0153   CREATININE 1.78 (H) 08/05/2018 0153   CALCIUM 8.6 (L) 08/05/2018 0153   PROT 6.7 08/04/2018 0746   ALBUMIN 3.6 08/04/2018 0746   AST 22 08/04/2018 0746   ALT 16 08/04/2018 0746   ALKPHOS 43 08/04/2018 0746   BILITOT 0.8 08/04/2018 0746   GFRNONAA 35 (L) 08/05/2018 0153   GFRAA 41 (L) 08/05/2018 0153   Lipase     Component Value Date/Time   LIPASE 31 08/03/2018 1445  Studies/Results: Ct Abdomen Pelvis Wo Contrast  Result Date: 08/03/2018 CLINICAL DATA:  History of small bowel obstruction. Recurrent symptoms. EXAM: CT ABDOMEN AND PELVIS WITHOUT CONTRAST TECHNIQUE: Multidetector CT imaging of the abdomen and pelvis was performed following the standard protocol without IV contrast. COMPARISON:  None. FINDINGS: Lower chest: Normal Hepatobiliary: Liver parenchyma is normal.  No calcified gallstones. Pancreas: Normal Spleen: Normal Adrenals/Urinary Tract: Adrenal glands show mild hyperplasia. No focal renal lesion. The kidneys are slightly small. No obstruction. Bladder appears normal. Stomach/Bowel: Acute  small bowel obstruction with dilated fluid and air-filled small intestine. The distal small bowel is collapsed. There is ascites, often seen with small-bowel obstruction. There is sigmoid diverticulosis without evidence of diverticulitis. Previous colon anastomosis in the region of the hepatic flexure colon is patulous at that level, possibly due to denervation. Vascular/Lymphatic: Aortic atherosclerosis. No aneurysm. IVC is normal. No retroperitoneal adenopathy. Reproductive: Multiple prostate seed implants. Other: No free air.  Ascites as noted above. Musculoskeletal: Ordinary lower lumbar degenerative changes. IMPRESSION: Small-bowel obstruction in about the midportion. Partial or early. Distal small bowel is not dilated. Stool in the colon. Previous colon resection/anastomosis in the hepatic flexure region. Small amount of ascites presumably associated with the small bowel obstruction. No free air. Electronically Signed   By: Nelson Chimes M.D.   On: 08/03/2018 20:50   Ct L-spine No Charge  Result Date: 08/03/2018 CLINICAL DATA:  Abdominal pain.  Fall with back pain. EXAM: CT LUMBAR SPINE WITHOUT CONTRAST TECHNIQUE: Multidetector CT imaging of the lumbar spine was performed without intravenous contrast administration. Multiplanar CT image reconstructions were also generated. COMPARISON:  None. FINDINGS: Segmentation: 5 lumbar type vertebral bodies. Alignment: Straightening of the normal lumbar lordosis. Vertebrae: No fracture or primary bone lesion. Paraspinal and other soft tissues: See results of abdominal CT. Disc levels: No significant disc space finding L3-4 or above. Minimal disc bulges. Ordinary facet osteoarthritis. No compressive canal or foraminal narrowing. L4-5: Moderate bulging of the disc. Mild facet degeneration. Mild stenosis the lateral recesses and foramina. L5-S1: Disc degeneration with disc space narrowing. Chronic endplate osteophytes more prominent towards the right. Facet osteoarthritis.  Narrowing of the subarticular lateral recess on the right and the intervertebral foramen on the right that could possibly be associated with right-sided nerve compression. Solid bridging osteophytes of the sacroiliac joints. IMPRESSION: No acute or traumatic lumbar finding. Degenerative disc disease and degenerative facet disease at L4-5. Mild stenosis of the lateral recesses and foramina at L4-5. Right subarticular lateral recess and foraminal narrowing at L5-S1 Chronic bridging sacroiliac osteophytes. Electronically Signed   By: Nelson Chimes M.D.   On: 08/03/2018 20:44   Dg Abd Portable 1v  Result Date: 08/04/2018 CLINICAL DATA:  Small-bowel obstruction. Nasogastric tube placement. EXAM: PORTABLE ABDOMEN - 1 VIEW COMPARISON:  Plain film of the abdomen from earlier same day. FINDINGS: Nasogastric tube passes just below the LEFT hemidiaphragm. Proximal side holes are above the level of the LEFT hemidiaphragm. Distended gas-filled loops of small bowel throughout the abdomen, compatible with the given history of small-bowel obstruction. Elevation of the RIGHT hemidiaphragm. No evidence of free intraperitoneal air. IMPRESSION: 1. Nasogastric tube passes just below the LEFT hemidiaphragm. Proximal side holes are above the level of the LEFT hemidiaphragm. As such, recommend advancing the nasogastric tube approximately 10-15 cm for more optimal radiographic positioning. 2. Distended gas-filled loops of small bowel throughout the abdomen, compatible with the given history of small-bowel obstruction. Electronically Signed   By: Franki Cabot M.D.   On: 08/04/2018  15:46   Dg Abd Portable 1v-small Bowel Obstruction Protocol-initial, 8 Hr Delay  Result Date: 08/04/2018 CLINICAL DATA:  Small bowel obstruction. EXAM: PORTABLE ABDOMEN - 1 VIEW COMPARISON:  Radiograph August 23, 2018. FINDINGS: Nasogastric tube is no longer visualized. Stable small bowel dilatation is noted concerning for distal small bowel obstruction. No  definite colonic dilatation is noted. No definite contrast is visualized. IMPRESSION: Small bowel dilatation is noted concerning for distal small bowel obstruction. Nasogastric tube is no longer visualized. Electronically Signed   By: Marijo Conception M.D.   On: 08/04/2018 10:49   Dg Abd Portable 1v-small Bowel Protocol-position Verification  Result Date: 08/04/2018 CLINICAL DATA:  Check NG tube placement EXAM: PORTABLE ABDOMEN - 1 VIEW COMPARISON:  None. FINDINGS: Scattered large and small bowel gas is noted. Diffuse small bowel dilatation is noted similar to that seen on recent CT examination. Gastric catheter is been placed with the tip in the stomach. Proximal side port lies in the distal esophagus. This should be advanced several cm further into the stomach. IMPRESSION: Nasogastric catheter with tip in stomach. This should be advanced several cm to allow the proximal side port to lie within the stomach as well. Persistent small bowel dilatation. Electronically Signed   By: Inez Catalina M.D.   On: 08/04/2018 00:00      [facility] 08/05/2018, 10:24 AM  Pager: [phone] Mon-Wed, Friday 7:00am-4:30pm Thurs 7am-11:30am  Consults: [phone]

## 2018-08-05 NOTE — Progress Notes (Signed)
PROGRESS NOTE  Daniel Odom GHW:[id number] DOB: 1935/04/05 DOA: 08/03/2018 PCP: [facility]  Brief History   83 year old man PMH colon cancer status post right hemicolectomy presented with abdominal pain.  Admitted for small bowel obstruction.  A & P  Small bowel obstruction.  PMH colon cancer status post right hemicolectomy. --Continues to have some flatus but no bowel movement.  Radiograph shows persistent small bowel obstruction.  No fever, no leukocytosis.  Continue management per surgery.    Mobitz 1, sinus rhythm with frequent ectopy --Now in sinus rhythm.  No ectopy noted.  No concerning rhythms identified. --Discontinue telemetry.  Diabetes mellitus type 2 --Remained stable.  Continue sliding scale insulin.  CKD stage III --Likely at baseline.  Creatinine was 1.87 in February 07, 2017.  No need for daily monitoring.  Anemia, likely anemia from CKD. --Stable.  No further evaluation suggested.  Essential hypertension --Remained stable.  OSA --Continue CPAP each night   Overall appears stable.  Continue management per general surgery.  Stop telemetry.   DVT prophylaxis: heparin, SCDs Code Status: Full Family Communication: none Disposition Plan: home    [name], MD  [facility] Direct contact: see [email or web address] (further directions at bottom of note if needed) 7PM-7AM contact night coverage as at bottom of note 08/05/2018, 11:48 AM  LOS: 2 days   Consultants  . General surgery  Procedures  .   Antibiotics  .   Interval History/Subjective  Tube got clogged last night and he had some discomfort and nausea but now feels better with tube working again.  No chest pain.  Objective   Vitals:  Vitals:   08/04/18 2021 08/05/18 0441  BP: 136/65 (!) 141/71  Pulse: 84 86  Resp: 16 16  Temp: 98.6 F (37 C) 98.8 F (37.1 C)  SpO2: 98% 99%    Exam:  Constitutional:   . Appears calm and comfortable sitting in chair.  NG tube in  place. Respiratory:  . CTA bilaterally, no w/r/r.  . Respiratory effort normal.  Cardiovascular:  . RRR, no m/r/g . No LE extremity edema . Telemetry sinus rhythm Psychiatric:  . Mental status o Mood, affect appropriate . judgment and insight appear intact    I have personally reviewed the following:   Today's Data  . Telemetry shows sinus rhythm . CBG stable . BUN stable at 28.  Creatinine trending down, 1.78. Marland Kitchen Hemoglobin stable at 10.8.  No leukocytosis.  Lab Data  . Reviewed  Micro Data  .   Imaging  . CT abdomen pelvis reviewed.  Cardiology Data  . Independent review: EKG 7/9 shows sinus rhythm with first-degree AV block.  EKG 7/10 narrow complex rhythm within normal rate.  Not atrial fibrillation.  No evidence of flutter.  Suspect sinus rhythm.  Other Data  .   Scheduled Meds: . bisacodyl  10 mg Rectal Daily  . heparin injection (subcutaneous)  5,000 Units Subcutaneous Q8H  . insulin aspart  0-9 Units Subcutaneous Q4H  . lip balm  1 application Topical BID  . sodium chloride flush  3 mL Intravenous Once   Continuous Infusions: . chlorproMAZINE (THORAZINE) IV    . lactated ringers    . lactated ringers    . methocarbamol (ROBAXIN) IV    . ondansetron (ZOFRAN) IV      Principal Problem:   SBO (small bowel obstruction) (HCC) Active Problems:   History of colon cancer   Diabetes mellitus type 2 in obese [facility])   Essential hypertension  CKD (chronic kidney disease) stage 3, GFR 30-59 ml/min (HCC)   Anemia associated with chronic renal failure   Hypertension   Diabetes mellitus without complication (HCC)   Mobitz type 1 second degree atrioventricular block   Morbid obesity with body mass index of 40.0-49.9 (HCC)   LOS: 2 days   How to contact the [facility] Attending or Consulting provider [place] or covering provider during after hours [place], for this patient?  1. Check the care team in [facility] and look for a) attending/consulting [facility] provider listed and b)  the [facility] team listed 2. Log into [email or web address] and use [place]'s universal password to access. If you do not have the password, please contact the hospital operator. 3. Locate the [facility] provider you are looking for under [facility] and page to a number that you can be directly reached. 4. If you still have difficulty reaching the provider, please page the [facility] (Director on Call) for the Hospitalists listed on amion for assistance.

## 2018-08-06 ENCOUNTER — Inpatient Hospital Stay (HOSPITAL_COMMUNITY): Payer: No Typology Code available for payment source

## 2018-08-06 LAB — BASIC METABOLIC PANEL
Anion gap: 12 (ref 5–15)
BUN: 30 mg/dL — ABNORMAL HIGH (ref 8–23)
CO2: 27 mmol/L (ref 22–32)
Calcium: 8.7 mg/dL — ABNORMAL LOW (ref 8.9–10.3)
Chloride: 104 mmol/L (ref 98–111)
Creatinine, Ser: 1.8 mg/dL — ABNORMAL HIGH (ref 0.61–1.24)
GFR calc Af Amer: 40 mL/min — ABNORMAL LOW (ref >60)
GFR calc non Af Amer: 35 mL/min — ABNORMAL LOW (ref >60)
Glucose, Bld: 179 mg/dL — ABNORMAL HIGH (ref 70–99)
Potassium: 3.4 mmol/L — ABNORMAL LOW (ref 3.5–5.1)
Sodium: 143 mmol/L (ref 135–145)

## 2018-08-06 LAB — CBC
HCT: 29.7 % — ABNORMAL LOW (ref 39.0–52.0)
Hemoglobin: 10.5 g/dL — ABNORMAL LOW (ref 13.0–17.0)
MCH: 29 pg (ref 26.0–34.0)
MCHC: 35.4 g/dL (ref 30.0–36.0)
MCV: 82 fL (ref 80.0–100.0)
Platelets: 250 10*3/uL (ref 150–400)
RBC: 3.62 MIL/uL — ABNORMAL LOW (ref 4.22–5.81)
RDW: 14.4 % (ref 11.5–15.5)
WBC: 6.5 10*3/uL (ref 4.0–10.5)
nRBC: 0 % (ref 0.0–0.2)

## 2018-08-06 LAB — GLUCOSE, CAPILLARY
Glucose-Capillary: 149 mg/dL — ABNORMAL HIGH (ref 70–99)
Glucose-Capillary: 163 mg/dL — ABNORMAL HIGH (ref 70–99)
Glucose-Capillary: 170 mg/dL — ABNORMAL HIGH (ref 70–99)
Glucose-Capillary: 174 mg/dL — ABNORMAL HIGH (ref 70–99)
Glucose-Capillary: 181 mg/dL — ABNORMAL HIGH (ref 70–99)
Glucose-Capillary: 186 mg/dL — ABNORMAL HIGH (ref 70–99)

## 2018-08-06 LAB — MAGNESIUM: Magnesium: 2.3 mg/dL (ref 1.7–2.4)

## 2018-08-06 MED ORDER — POTASSIUM CHLORIDE 10 MEQ/100ML IV SOLN
10.0000 meq | INTRAVENOUS | Status: AC
  Administered 2018-08-06 (×4): 10 meq via INTRAVENOUS
  Filled 2018-08-06 (×4): qty 100

## 2018-08-06 NOTE — Progress Notes (Signed)
Patient has NG tube and has been experiencing some nausea. Patient states he cannot handle CPAP at this time. Vital signs are stable.  RT will continue to monitor as needed.

## 2018-08-06 NOTE — Progress Notes (Signed)
Pt feeling bette and had a good BM. Pt wanted to try clamping trial again. NGT to clamp. Gave pt something to drink. Instructed pt tp call if pain or nausea occurs. Will continue to monitor.

## 2018-08-06 NOTE — Progress Notes (Signed)
[facility]/Trauma Progress Note Principal Problem:   SBO (small bowel obstruction) (HCC) Active Problems:   CKD (chronic kidney disease) stage 3, GFR 30-59 ml/min (HCC)   History of colon cancer   Diabetes mellitus type 2 in obese ([place])   Essential hypertension   Anemia associated with chronic renal failure   Hypertension   Diabetes mellitus without complication (HCC)   Mobitz type 1 second degree atrioventricular block   Morbid obesity with body mass index of 40.0-49.9 (HCC)     Assessment/Plan   SBO - On SBprotocol, still with small bowel dilatation and no contrast in colon.  Fewer small bowel loops though. Nasogastric output much less the past 12 hours.  Clinically better with flatus and bowel movements.  Abdomen clearly softer.  Discussed with patient and Dr. Sarajane Jews.  We will do clamping trial with liquids.  If tolerates this with low residuals and no recurrent symptoms, consider removing nasogastric tube in morning and advancing diet. -If cannot tolerate nasogastric tube clamping trial or worsens, may need surgical exploration in a day or 2. - we will follow   FEN: NG tube clamping trial with clears only. Hyperglycemia on D5.  Switch to LR.  Chronic kidney disease with creatinine back down to 1.7.  VTE: SCD's, heparin ID: none Foley: none Follow up: TBD    LOS: 3 days    Subjective: CC: SBO  Feels better.  Dr. Sarajane Jews with medicine in room.  Claims to have flatus and bowel movements.  No nausea.  No worsening pain.  Objective: Vital signs in last 24 hours: Temp:  [97.8 F (36.6 C)-98.2 F (36.8 C)] 97.8 F (36.6 C) (07/12 0446) Pulse Rate:  [87-91] 87 (07/12 0446) Resp:  [14-18] 16 (07/12 0446) BP: (110-133)/(63-69) 128/68 (07/12 0446) SpO2:  [95 %-100 %] 97 % (07/12 0446) Last BM Date: 08/05/18  Intake/Output from previous day: 07/11 0701 - 07/12 0700 In: 959.3 [I.V.:839.3; NG/GT:120] Out: 2576 [Urine:975; Emesis/NG output:1600;  Stool:1] Intake/Output this shift: Total I/O In: 439.3 [I.V.:439.3] Out: 750 [Emesis/NG output:750]  PE: General: Pt awake/alert/oriented x4 in no major acute distress Eyes: PERRL, normal EOM. Sclera nonicteric Neuro: CN II-XII intact w/o focal sensory/motor deficits. Lymph: No head/neck/groin lymphadenopathy Psych:  No delerium/psychosis/paranoia HENT: Normocephalic, Mucus membranes moist.  No thrush.  NG tube in place Neck: Supple, No tracheal deviation Chest: No pain.  Good respiratory excursion. CV:  Pulses intact.  Regular rhythm MS: Normal AROM mjr joints.  No obvious deformity  Abdomen: Obese but soft.  Nondistended.  Nontender.  No incarcerated hernias.  Ext:  No significant edema.  No cyanosis Skin: No petechiae / purpura    Anti-infectives: Anti-infectives (From admission, onward)   None      Lab Results:  Recent Labs    08/05/18 0153 08/06/18 0246  WBC 6.7 6.5  HGB 10.8* 10.5*  HCT 30.6* 29.7*  PLT 269 250   BMET Recent Labs    08/05/18 0153 08/06/18 0246  NA 138 143  K 3.9 3.4*  CL 106 104  CO2 23 27  GLUCOSE 200* 179*  BUN 28* 30*  CREATININE 1.78* 1.80*  CALCIUM 8.6* 8.7*   PT/INR No results for input(s): LABPROT, INR in the last 72 hours. CMP     Component Value Date/Time   NA 143 08/06/2018 0246   K 3.4 (L) 08/06/2018 0246   CL 104 08/06/2018 0246   CO2 27 08/06/2018 0246   GLUCOSE 179 (H) 08/06/2018 0246   BUN 30 (H) 08/06/2018  0246   CREATININE 1.80 (H) 08/06/2018 0246   CALCIUM 8.7 (L) 08/06/2018 0246   PROT 6.7 08/04/2018 0746   ALBUMIN 3.6 08/04/2018 0746   AST 22 08/04/2018 0746   ALT 16 08/04/2018 0746   ALKPHOS 43 08/04/2018 0746   BILITOT 0.8 08/04/2018 0746   GFRNONAA 35 (L) 08/06/2018 0246   GFRAA 40 (L) 08/06/2018 0246   Lipase     Component Value Date/Time   LIPASE 31 08/03/2018 1445    Studies/Results: Dg Abd 2 Views  Result Date: 08/06/2018 CLINICAL DATA:  Small-bowel obstruction. EXAM: ABDOMEN - 2  VIEW COMPARISON:  08/05/2018 and earlier exams. FINDINGS: There is persistent dilation of small bowel with air-fluid levels on the erect view. No free air. No colonic dilation. Bowel anastomosis staples are again noted in the left mid abdomen. Nasogastric tube is stable within the proximal stomach. IMPRESSION: 1. Persistent small bowel obstruction. No change from the previous day's study. Electronically Signed   By: Lajean Manes M.D.   On: 08/06/2018 06:03   Dg Abd Portable 1v  Result Date: 08/05/2018 CLINICAL DATA:  Follow-up small bowel obstruction. EXAM: PORTABLE ABDOMEN - 1 VIEW COMPARISON:  08/04/2018. FINDINGS: There is significant small bowel dilation, without change from the previous day's study. Bowel anastomosis staples are noted in the left mid abdomen. Nasogastric tube passes below the diaphragm into the proximal stomach. IMPRESSION: 1. Persistent dilated small bowel consistent with a small-bowel obstruction, unchanged when compared to the previous day's exam. Electronically Signed   By: Lajean Manes M.D.   On: 08/05/2018 10:45   Dg Abd Portable 1v  Result Date: 08/04/2018 CLINICAL DATA:  Small-bowel obstruction. Nasogastric tube placement. EXAM: PORTABLE ABDOMEN - 1 VIEW COMPARISON:  Plain film of the abdomen from earlier same day. FINDINGS: Nasogastric tube passes just below the LEFT hemidiaphragm. Proximal side holes are above the level of the LEFT hemidiaphragm. Distended gas-filled loops of small bowel throughout the abdomen, compatible with the given history of small-bowel obstruction. Elevation of the RIGHT hemidiaphragm. No evidence of free intraperitoneal air. IMPRESSION: 1. Nasogastric tube passes just below the LEFT hemidiaphragm. Proximal side holes are above the level of the LEFT hemidiaphragm. As such, recommend advancing the nasogastric tube approximately 10-15 cm for more optimal radiographic positioning. 2. Distended gas-filled loops of small bowel throughout the abdomen,  compatible with the given history of small-bowel obstruction. Electronically Signed   By: Franki Cabot M.D.   On: 08/04/2018 15:46   Dg Abd Portable 1v-small Bowel Obstruction Protocol-initial, 8 Hr Delay  Result Date: 08/04/2018 CLINICAL DATA:  Small bowel obstruction. EXAM: PORTABLE ABDOMEN - 1 VIEW COMPARISON:  Radiograph August 23, 2018. FINDINGS: Nasogastric tube is no longer visualized. Stable small bowel dilatation is noted concerning for distal small bowel obstruction. No definite colonic dilatation is noted. No definite contrast is visualized. IMPRESSION: Small bowel dilatation is noted concerning for distal small bowel obstruction. Nasogastric tube is no longer visualized. Electronically Signed   By: Marijo Conception M.D.   On: 08/04/2018 10:49      [facility] 08/06/2018, 8:59 AM  Pager: [phone] Mon-Wed, Friday 7:00am-4:30pm Thurs 7am-11:30am  Consults: [phone]

## 2018-08-06 NOTE — Progress Notes (Signed)
Patient is complaining of nausea. NGT placed back to suction and patient instructed to only have ice for the remainder of the day.

## 2018-08-06 NOTE — Progress Notes (Signed)
PROGRESS NOTE  Daniel Odom LFY:[id number] DOB: [birth year] DOA: 08/03/2018 PCP: [facility]  Brief History   83 year old man PMH colon cancer status post right hemicolectomy presented with abdominal pain.  Admitted for small bowel obstruction.  A & P  Small bowel obstruction.  PMH colon cancer status post right hemicolectomy. --Has had a bowel movement but x-ray still shows small bowel obstruction.  As per surgeon, plan is to clamp NG tube and trial liquids.  Diabetes mellitus type 2 --Remains stable.  Continue sliding scale insulin.  CKD stage III --Appears to be at baseline.  Creatinine was 1.87 in February 07, 2017.    Anemia, likely anemia from CKD. --Stable.  No further evaluation suggested.  Essential hypertension --Stable.  OSA --Continue CPAP each night   Continue IV fluids.  Check BMP in a.m.  Discussed with Daniel Odom.   DVT prophylaxis: heparin, SCDs Code Status: Full Family Communication: none Disposition Plan: home    Daniel [name], MD  [facility] Direct contact: see [email or web address] (further directions at bottom of note if needed) 7PM-7AM contact night coverage as at bottom of note 08/06/2018, 9:43 AM  LOS: 3 days   Consultants  . General surgery  Procedures  .   Antibiotics  .   Interval History/Subjective  Had a bowel movement, feeling better.  No nausea.  No pain.  Objective   Vitals:  Vitals:   08/05/18 2038 08/06/18 0446  BP: 133/69 128/68  Pulse: 88 87  Resp: 18 16  Temp: 97.8 F (36.6 C) 97.8 F (36.6 C)  SpO2: 95% 97%    Exam:  Constitutional:   . Appears calm and comfortable Respiratory:  . CTA bilaterally, no w/r/r.  . Respiratory effort normal.  Cardiovascular:  . RRR, no m/r/g . No LE extremity edema   Abdomen:  . Soft Psychiatric:  . Mental status o Mood, affect appropriate  I have personally reviewed the following:   Today's Data  . CBG stable.  Potassium 3.4.  Creatinine stable at 1.8.   Magnesium within normal limits.  Hemoglobin stable at 10.5. . Abdominal x-ray shows persistent small bowel obstruction without change  Lab Data  . Reviewed  Micro Data  .   Imaging  . CT abdomen pelvis reviewed.  Cardiology Data  . Independent review: EKG 7/9 shows sinus rhythm with first-degree AV block.  EKG 7/10 narrow complex rhythm within normal rate.  Not atrial fibrillation.  No evidence of flutter.  Suspect sinus rhythm.  Other Data  .   Scheduled Meds: . bisacodyl  10 mg Rectal Daily  . heparin injection (subcutaneous)  5,000 Units Subcutaneous Q8H  . insulin aspart  0-9 Units Subcutaneous Q4H  . lip balm  1 application Topical BID  . sodium chloride flush  3 mL Intravenous Once   Continuous Infusions: . chlorproMAZINE (THORAZINE) IV    . lactated ringers    . lactated ringers 75 mL/hr at 08/06/18 0722  . methocarbamol (ROBAXIN) IV    . ondansetron (ZOFRAN) IV    . potassium chloride 10 mEq (08/06/18 0929)    Principal Problem:   SBO (small bowel obstruction) (HCC) Active Problems:   History of colon cancer   Diabetes mellitus type 2 in obese [facility])   Essential hypertension   CKD (chronic kidney disease) stage 3, GFR 30-59 ml/min (HCC)   Anemia associated with chronic renal failure   Hypertension   Diabetes mellitus without complication (HCC)   Mobitz type 1 second degree atrioventricular block  Morbid obesity with body mass index of 40.0-49.9 (HCC)   LOS: 3 days   How to contact the [facility] Attending or Consulting provider [place] or covering provider during after hours [place], for this patient?  1. Check the care team in [facility] and look for a) attending/consulting TRH provider listed and b) the [facility] team listed 2. Log into [email or web address] and use [place]'s universal password to access. If you do not have the password, please contact the hospital operator. 3. Locate the [facility] provider you are looking for under [facility] and page to a number that you can  be directly reached. 4. If you still have difficulty reaching the provider, please page the [facility] (Director on Call) for the Hospitalists listed on amion for assistance.

## 2018-08-07 ENCOUNTER — Inpatient Hospital Stay (HOSPITAL_COMMUNITY): Payer: No Typology Code available for payment source

## 2018-08-07 LAB — BASIC METABOLIC PANEL
Anion gap: 10 (ref 5–15)
BUN: 33 mg/dL — ABNORMAL HIGH (ref 8–23)
CO2: 26 mmol/L (ref 22–32)
Calcium: 8.4 mg/dL — ABNORMAL LOW (ref 8.9–10.3)
Chloride: 102 mmol/L (ref 98–111)
Creatinine, Ser: 1.82 mg/dL — ABNORMAL HIGH (ref 0.61–1.24)
GFR calc Af Amer: 39 mL/min — ABNORMAL LOW (ref >60)
GFR calc non Af Amer: 34 mL/min — ABNORMAL LOW (ref >60)
Glucose, Bld: 195 mg/dL — ABNORMAL HIGH (ref 70–99)
Potassium: 3.5 mmol/L (ref 3.5–5.1)
Sodium: 138 mmol/L (ref 135–145)

## 2018-08-07 LAB — CBC
HCT: 27.7 % — ABNORMAL LOW (ref 39.0–52.0)
Hemoglobin: 9.8 g/dL — ABNORMAL LOW (ref 13.0–17.0)
MCH: 28.8 pg (ref 26.0–34.0)
MCHC: 35.4 g/dL (ref 30.0–36.0)
MCV: 81.5 fL (ref 80.0–100.0)
Platelets: 199 10*3/uL (ref 150–400)
RBC: 3.4 MIL/uL — ABNORMAL LOW (ref 4.22–5.81)
RDW: 14.2 % (ref 11.5–15.5)
WBC: 7.6 10*3/uL (ref 4.0–10.5)
nRBC: 0 % (ref 0.0–0.2)

## 2018-08-07 LAB — GLUCOSE, CAPILLARY
Glucose-Capillary: 159 mg/dL — ABNORMAL HIGH (ref 70–99)
Glucose-Capillary: 163 mg/dL — ABNORMAL HIGH (ref 70–99)
Glucose-Capillary: 166 mg/dL — ABNORMAL HIGH (ref 70–99)
Glucose-Capillary: 166 mg/dL — ABNORMAL HIGH (ref 70–99)
Glucose-Capillary: 168 mg/dL — ABNORMAL HIGH (ref 70–99)
Glucose-Capillary: 178 mg/dL — ABNORMAL HIGH (ref 70–99)
Glucose-Capillary: 187 mg/dL — ABNORMAL HIGH (ref 70–99)

## 2018-08-07 NOTE — Progress Notes (Signed)
PROGRESS NOTE  Daniel Odom ELF:[id number] DOB: [birth year] DOA: 08/03/2018 PCP: [facility]  Brief History   83 year old man PMH colon cancer status post right hemicolectomy presented with abdominal pain.  Admitted for small bowel obstruction.  A & P  Small bowel obstruction.  PMH colon cancer status post right hemicolectomy. --Bowel movement again last night but x-ray still shows small bowel obstruction.  Continue management per surgery, patient remains n.p.o.  NG tube to suction to see residual output.  Diabetes mellitus type 2 --Remained stable, continue sliding scale insulin.  CKD stage III --Stable.  Repeat BMP pending.  Creatinine was 1.87 in February 07, 2017.    Anemia, likely anemia from CKD. --Has been stable.  No further evaluation suggested.  Essential hypertension --Remains stable.  OSA --Continue CPAP each night   Continue management per surgery.  Continue fluids.  Follow-up lab work.  Repeat BMP in a.m.  DVT prophylaxis: heparin, SCDs Code Status: Full Family Communication: none Disposition Plan: home    [name], MD  [facility] Direct contact: see [email or web address] (further directions at bottom of note if needed) 7PM-7AM contact night coverage as at bottom of note 08/07/2018, 10:16 AM  LOS: 4 days   Consultants  . General surgery  Procedures  .   Antibiotics  .   Interval History/Subjective  Had nausea yesterday afternoon but then had a bowel movement around 730.  No issues overnight.  Feeling a lot better today.  No pain.  Objective   Vitals:  Vitals:   08/06/18 1946 08/07/18 0439  BP: 115/68 127/69  Pulse: (!) 110 93  Resp: 16 14  Temp: 97.6 F (36.4 C) 98.4 F (36.9 C)  SpO2: 99% 97%    Exam:  Constitutional:   . Appears calm and comfortable ENMT:  . Hard of hearing . NG tube in place Respiratory:  . CTA bilaterally, no w/r/r.  . Respiratory effort normal.  Cardiovascular:  . RRR, no m/r/g . No LE  extremity edema   Abdomen:  . Soft, nontender, nondistended Psychiatric:  . Mental status o Mood, affect appropriate   I have personally reviewed the following:   Today's Data  CBG stable BMP pending Abdominal x-ray still shows small bowel obstruction Lab Data  .   Micro Data  .   Imaging  . CT abdomen pelvis reviewed.  Cardiology Data  . Independent review: EKG 7/9 shows sinus rhythm with first-degree AV block.  EKG 7/10 narrow complex rhythm within normal rate.  Not atrial fibrillation.  No evidence of flutter.  Suspect sinus rhythm.  Other Data  .   Scheduled Meds: . bisacodyl  10 mg Rectal Daily  . heparin injection (subcutaneous)  5,000 Units Subcutaneous Q8H  . insulin aspart  0-9 Units Subcutaneous Q4H  . lip balm  1 application Topical BID  . sodium chloride flush  3 mL Intravenous Once   Continuous Infusions: . chlorproMAZINE (THORAZINE) IV    . lactated ringers    . lactated ringers 100 mL/hr at 08/07/18 0015  . methocarbamol (ROBAXIN) IV    . ondansetron (ZOFRAN) IV      Principal Problem:   SBO (small bowel obstruction) (HCC) Active Problems:   History of colon cancer   Diabetes mellitus type 2 in obese [facility])   Essential hypertension   CKD (chronic kidney disease) stage 3, GFR 30-59 ml/min (HCC)   Anemia associated with chronic renal failure   Hypertension   Diabetes mellitus without complication (HCC)   Mobitz type  1 second degree atrioventricular block   Morbid obesity with body mass index of 40.0-49.9 (HCC)   LOS: 4 days   How to contact the [facility] Attending or Consulting provider [place] or covering provider during after hours [place], for this patient?  1. Check the care team in [facility] and look for a) attending/consulting TRH provider listed and b) the [facility] team listed 2. Log into [email or web address] and use [place]'s universal password to access. If you do not have the password, please contact the hospital operator. 3. Locate the [facility] provider you  are looking for under [facility] and page to a number that you can be directly reached. 4. If you still have difficulty reaching the provider, please page the [facility] (Director on Call) for the Hospitalists listed on amion for assistance.

## 2018-08-07 NOTE — Progress Notes (Signed)
[facility] Progress Note      Assessment/Plan history of colon cancer status post right hemicolectomy history of prostate cancer diabetes mellitus type 2 Hypertension chronic kidney disease stage III  SBO - no abdominal pain this am and having flatus, had a BM overnight. Tolerated clamping trial - AXR this am showed persistent SBO - NPO, NGT to suction to see residual output  FEN: NPO, NGT  VTE: SCD's, heparin ID: none Foley: none Follow up: TBD   LOS: 4 days    Subjective: CC: no complaints  Pt states after tube was clamped yesterday around 1500 he got nauseated. He got a suppository, passed gas and felt better. No additional nausea or vomiting since and he has been drinking liquids. He is still having flatus.    Objective: Vital signs in last 24 hours: Temp:  [97.6 F (36.4 C)-98.5 F (36.9 C)] 98.4 F (36.9 C) (07/13 0439) Pulse Rate:  [86-110] 93 (07/13 0439) Resp:  [14-16] 14 (07/13 0439) BP: (115-133)/(66-69) 127/69 (07/13 0439) SpO2:  [97 %-99 %] 97 % (07/13 0439) Last BM Date: 08/06/18  Intake/Output from previous day: 07/12 0701 - 07/13 0700 In: 3634.1 [P.O.:1018; I.V.:2616.1] Out: 31 [Urine:610; Emesis/NG output:1700] Intake/Output this shift: Total I/O In: -  Out: 100 [Urine:100]  PE: Gen:  Alert, NAD, pleasant, cooperative Pulm:  CTA, no W/R/R, effort normal Abd: Soft, obese, NT/ND, hypoactive BS, no HSM, well healed previous midline scar Skin: no rashes noted, warm and dry  Anti-infectives: Anti-infectives (From admission, onward)   None      Lab Results:  Recent Labs    08/05/18 0153 08/06/18 0246  WBC 6.7 6.5  HGB 10.8* 10.5*  HCT 30.6* 29.7*  PLT 269 250   BMET Recent Labs    08/05/18 0153 08/06/18 0246  NA 138 143  K 3.9 3.4*  CL 106 104  CO2 23 27  GLUCOSE 200* 179*  BUN 28* 30*  CREATININE 1.78* 1.80*  CALCIUM 8.6* 8.7*   PT/INR No results for input(s): LABPROT, INR in the last 72  hours. CMP     Component Value Date/Time   NA 143 08/06/2018 0246   K 3.4 (L) 08/06/2018 0246   CL 104 08/06/2018 0246   CO2 27 08/06/2018 0246   GLUCOSE 179 (H) 08/06/2018 0246   BUN 30 (H) 08/06/2018 0246   CREATININE 1.80 (H) 08/06/2018 0246   CALCIUM 8.7 (L) 08/06/2018 0246   PROT 6.7 08/04/2018 0746   ALBUMIN 3.6 08/04/2018 0746   AST 22 08/04/2018 0746   ALT 16 08/04/2018 0746   ALKPHOS 43 08/04/2018 0746   BILITOT 0.8 08/04/2018 0746   GFRNONAA 35 (L) 08/06/2018 0246   GFRAA 40 (L) 08/06/2018 0246   Lipase     Component Value Date/Time   LIPASE 31 08/03/2018 1445    Studies/Results: Dg Abd 2 Views  Result Date: 08/06/2018 CLINICAL DATA:  Small-bowel obstruction. EXAM: ABDOMEN - 2 VIEW COMPARISON:  08/05/2018 and earlier exams. FINDINGS: There is persistent dilation of small bowel with air-fluid levels on the erect view. No free air. No colonic dilation. Bowel anastomosis staples are again noted in the left mid abdomen. Nasogastric tube is stable within the proximal stomach. IMPRESSION: 1. Persistent small bowel obstruction. No change from the previous day's study. Electronically Signed   By: Lajean Manes M.D.   On: 08/06/2018 06:03   Dg Abd Acute 2+v W 1v Chest  Result Date: 08/07/2018 CLINICAL DATA:  83 year old male with small bowel obstruction EXAM:  DG ABDOMEN ACUTE W/ 1V CHEST COMPARISON:  Prior acute abdominal series 08/06/2018 FINDINGS: Gastric tube remains in stable position. The catheter tip overlies the gastric fundus. Persistent elevation of the right hemidiaphragm. Trace atherosclerotic calcifications in the transverse aorta. The lungs are clear. Persistent gaseous distention of multiple loops of small bowel with a maximal diameter of 7 cm. Surgical clips are present in the left hemiabdomen suggesting prior bowel surgery. Minimal gas visualized in the sigmoid colon and rectum. Persistent mild thickening of the plica consistent with submucosal edema. IMPRESSION: 1.  Persistent small bowel obstruction without interval progression or improvement. 2. Gastric tube remains in good position. Electronically Signed   By: Jacqulynn Cadet M.D.   On: 08/07/2018 08:20      Kalman Drape , [facility] Surgery 08/07/2018, 9:20 AM  Pager: [phone] Mon-Wed, Friday 7:00am-4:30pm Thurs 7am-11:30am  Consults: [phone]

## 2018-08-07 NOTE — Progress Notes (Signed)
Patient unable to wear CPAP at this time due to NG tube in nose. Patient aware to let Respiratory know when tube is removed and CPAP can be set up if he desires.

## 2018-08-08 ENCOUNTER — Inpatient Hospital Stay (HOSPITAL_COMMUNITY): Payer: No Typology Code available for payment source

## 2018-08-08 ENCOUNTER — Inpatient Hospital Stay (HOSPITAL_COMMUNITY): Payer: No Typology Code available for payment source | Admitting: Anesthesiology

## 2018-08-08 ENCOUNTER — Encounter (HOSPITAL_COMMUNITY)
Admission: EM | Disposition: A | Discharge status: Home Health | Payer: Self-pay | Source: Home / Self Care | Attending: Family Medicine

## 2018-08-08 ENCOUNTER — Inpatient Hospital Stay: Payer: Self-pay

## 2018-08-08 ENCOUNTER — Encounter (HOSPITAL_COMMUNITY): Payer: Self-pay | Admitting: Family Medicine

## 2018-08-08 HISTORY — PX: LAPAROTOMY: SHX154

## 2018-08-08 HISTORY — PX: LYSIS OF ADHESION: SHX5961

## 2018-08-08 HISTORY — PX: LAPAROSCOPIC LYSIS OF ADHESIONS: SHX5905

## 2018-08-08 LAB — GLUCOSE, CAPILLARY
Glucose-Capillary: 184 mg/dL — ABNORMAL HIGH (ref 70–99)
Glucose-Capillary: 183 mg/dL — ABNORMAL HIGH (ref 70–99)
Glucose-Capillary: 169 mg/dL — ABNORMAL HIGH (ref 70–99)
Glucose-Capillary: 206 mg/dL — ABNORMAL HIGH (ref 70–99)
Glucose-Capillary: 216 mg/dL — ABNORMAL HIGH (ref 70–99)
Glucose-Capillary: 230 mg/dL — ABNORMAL HIGH (ref 70–99)

## 2018-08-08 LAB — SURGICAL PCR SCREEN
MRSA, PCR: NEGATIVE
Staphylococcus aureus: NEGATIVE

## 2018-08-08 LAB — BASIC METABOLIC PANEL
Anion gap: 12 (ref 5–15)
BUN: 34 mg/dL — ABNORMAL HIGH (ref 8–23)
CO2: 27 mmol/L (ref 22–32)
Calcium: 8.4 mg/dL — ABNORMAL LOW (ref 8.9–10.3)
Chloride: 102 mmol/L (ref 98–111)
Creatinine, Ser: 1.89 mg/dL — ABNORMAL HIGH (ref 0.61–1.24)
GFR calc Af Amer: 38 mL/min — ABNORMAL LOW (ref >60)
GFR calc non Af Amer: 33 mL/min — ABNORMAL LOW (ref >60)
Glucose, Bld: 197 mg/dL — ABNORMAL HIGH (ref 70–99)
Potassium: 3.2 mmol/L — ABNORMAL LOW (ref 3.5–5.1)
Sodium: 141 mmol/L (ref 135–145)

## 2018-08-08 LAB — TYPE AND SCREEN
ABO/RH(D): O POS
Antibody Screen: NEGATIVE

## 2018-08-08 LAB — MAGNESIUM: Magnesium: 1.9 mg/dL (ref 1.7–2.4)

## 2018-08-08 LAB — PHOSPHORUS: Phosphorus: 3.2 mg/dL (ref 2.5–4.6)

## 2018-08-08 LAB — ABO/RH: ABO/RH(D): O POS

## 2018-08-08 SURGERY — LYSIS, ADHESIONS, LAPAROSCOPIC
Anesthesia: General | Site: Abdomen

## 2018-08-08 MED ORDER — HYDROMORPHONE HCL 1 MG/ML IJ SOLN
INTRAMUSCULAR | Status: AC
  Filled 2018-08-08: qty 1

## 2018-08-08 MED ORDER — EPHEDRINE SULFATE-NACL 50-0.9 MG/10ML-% IV SOSY
PREFILLED_SYRINGE | INTRAVENOUS | Status: DC | PRN
  Administered 2018-08-08: 10 mg via INTRAVENOUS

## 2018-08-08 MED ORDER — GENTAMICIN SULFATE 40 MG/ML IJ SOLN
1.5000 mg/kg | Freq: Once | INTRAVENOUS | Status: AC
  Administered 2018-08-09: 180 mg via INTRAVENOUS
  Filled 2018-08-08: qty 4.5

## 2018-08-08 MED ORDER — BUPIVACAINE-EPINEPHRINE 0.25% -1:200000 IJ SOLN
INTRAMUSCULAR | Status: DC | PRN
  Administered 2018-08-08: 30 mL

## 2018-08-08 MED ORDER — PROMETHAZINE HCL 25 MG/ML IJ SOLN: 6.2500 mg | INTRAMUSCULAR | Status: DC | PRN

## 2018-08-08 MED ORDER — PROPOFOL 10 MG/ML IV BOLUS
INTRAVENOUS | Status: DC | PRN
  Administered 2018-08-08: 100 mg via INTRAVENOUS

## 2018-08-08 MED ORDER — POTASSIUM CHLORIDE 10 MEQ/100ML IV SOLN
10.0000 meq | INTRAVENOUS | Status: AC
  Administered 2018-08-08: 10 meq via INTRAVENOUS
  Filled 2018-08-08: qty 100

## 2018-08-08 MED ORDER — FENTANYL CITRATE (PF) 250 MCG/5ML IJ SOLN
INTRAMUSCULAR | Status: DC | PRN
  Administered 2018-08-08 (×3): 50 ug via INTRAVENOUS

## 2018-08-08 MED ORDER — LACTATED RINGERS IV SOLN: INTRAVENOUS | Status: DC

## 2018-08-08 MED ORDER — ONDANSETRON HCL 4 MG/2ML IJ SOLN
INTRAMUSCULAR | Status: AC
  Filled 2018-08-08: qty 2

## 2018-08-08 MED ORDER — SUGAMMADEX SODIUM 200 MG/2ML IV SOLN
INTRAVENOUS | Status: DC | PRN
  Administered 2018-08-08: 300 mg via INTRAVENOUS

## 2018-08-08 MED ORDER — ACETAMINOPHEN 10 MG/ML IV SOLN
1000.0000 mg | Freq: Once | INTRAVENOUS | Status: DC | PRN
  Administered 2018-08-08: 1000 mg via INTRAVENOUS

## 2018-08-08 MED ORDER — SODIUM CHLORIDE 0.9 % IV SOLN
INTRAVENOUS | Status: DC | PRN
  Administered 2018-08-08: 14:00:00 40 ug/min via INTRAVENOUS

## 2018-08-08 MED ORDER — ROCURONIUM BROMIDE 10 MG/ML (PF) SYRINGE
PREFILLED_SYRINGE | INTRAVENOUS | Status: AC
  Filled 2018-08-08: qty 10

## 2018-08-08 MED ORDER — TRAVASOL 10 % IV SOLN
INTRAVENOUS | Status: AC
  Administered 2018-08-08: 22:00:00 via INTRAVENOUS
  Filled 2018-08-08: qty 566.4

## 2018-08-08 MED ORDER — ROCURONIUM BROMIDE 10 MG/ML (PF) SYRINGE
PREFILLED_SYRINGE | INTRAVENOUS | Status: DC | PRN
  Administered 2018-08-08: 30 mg via INTRAVENOUS
  Administered 2018-08-08: 50 mg via INTRAVENOUS
  Administered 2018-08-08: 20 mg via INTRAVENOUS

## 2018-08-08 MED ORDER — ALBUMIN HUMAN 5 % IV SOLN
INTRAVENOUS | Status: AC
  Filled 2018-08-08: qty 250

## 2018-08-08 MED ORDER — FENTANYL CITRATE (PF) 250 MCG/5ML IJ SOLN
INTRAMUSCULAR | Status: AC
  Filled 2018-08-08: qty 5

## 2018-08-08 MED ORDER — DEXAMETHASONE SODIUM PHOSPHATE 10 MG/ML IJ SOLN
INTRAMUSCULAR | Status: AC
  Filled 2018-08-08: qty 1

## 2018-08-08 MED ORDER — ACETAMINOPHEN 10 MG/ML IV SOLN
INTRAVENOUS | Status: AC
  Filled 2018-08-08: qty 100

## 2018-08-08 MED ORDER — METHOCARBAMOL 1000 MG/10ML IJ SOLN
1000.0000 mg | Freq: Three times a day (TID) | INTRAVENOUS | Status: DC
  Administered 2018-08-08 – 2018-08-17 (×27): 1000 mg via INTRAVENOUS
  Filled 2018-08-08: qty 10
  Filled 2018-08-08: qty 500
  Filled 2018-08-08 (×31): qty 10
  Filled 2018-08-08: qty 500

## 2018-08-08 MED ORDER — LIDOCAINE 2% (20 MG/ML) 5 ML SYRINGE
INTRAMUSCULAR | Status: AC
  Filled 2018-08-08: qty 5

## 2018-08-08 MED ORDER — SODIUM CHLORIDE 0.9% FLUSH: 10.0000 mL | Freq: Two times a day (BID) | INTRAVENOUS | Status: DC

## 2018-08-08 MED ORDER — CLINDAMYCIN PHOSPHATE 600 MG/50ML IV SOLN
600.0000 mg | INTRAVENOUS | Status: AC
  Administered 2018-08-08: 14:00:00 600 mg via INTRAVENOUS
  Filled 2018-08-08: qty 50

## 2018-08-08 MED ORDER — PHENYLEPHRINE 40 MCG/ML (10ML) SYRINGE FOR IV PUSH (FOR BLOOD PRESSURE SUPPORT)
PREFILLED_SYRINGE | INTRAVENOUS | Status: DC | PRN
  Administered 2018-08-08 (×3): 80 ug via INTRAVENOUS
  Administered 2018-08-08: 120 ug via INTRAVENOUS
  Administered 2018-08-08: 80 ug via INTRAVENOUS

## 2018-08-08 MED ORDER — ALBUMIN HUMAN 5 % IV SOLN
INTRAVENOUS | Status: DC | PRN
  Administered 2018-08-08: 15:00:00 via INTRAVENOUS

## 2018-08-08 MED ORDER — LACTATED RINGERS IV SOLN
INTRAVENOUS | Status: DC | PRN
  Administered 2018-08-08: 14:00:00 via INTRAVENOUS

## 2018-08-08 MED ORDER — CLINDAMYCIN PHOSPHATE 600 MG/50ML IV SOLN
600.0000 mg | Freq: Once | INTRAVENOUS | Status: AC
  Administered 2018-08-08: 600 mg via INTRAVENOUS
  Filled 2018-08-08: qty 50

## 2018-08-08 MED ORDER — ALBUMIN HUMAN 5 % IV SOLN
12.5000 g | Freq: Once | INTRAVENOUS | Status: AC
  Administered 2018-08-08: 12.5 g via INTRAVENOUS

## 2018-08-08 MED ORDER — DEXAMETHASONE SODIUM PHOSPHATE 10 MG/ML IJ SOLN
INTRAMUSCULAR | Status: DC | PRN
  Administered 2018-08-08: 4 mg via INTRAVENOUS

## 2018-08-08 MED ORDER — LIDOCAINE 2% (20 MG/ML) 5 ML SYRINGE
INTRAMUSCULAR | Status: DC | PRN
  Administered 2018-08-08: 100 mg via INTRAVENOUS

## 2018-08-08 MED ORDER — SODIUM CHLORIDE 0.9% FLUSH: 10.0000 mL | INTRAVENOUS | Status: DC | PRN

## 2018-08-08 MED ORDER — INSULIN ASPART 100 UNIT/ML ~~LOC~~ SOLN
0.0000 [IU] | SUBCUTANEOUS | Status: DC
  Administered 2018-08-08 – 2018-08-09 (×3): 5 [IU] via SUBCUTANEOUS
  Administered 2018-08-09: 8 [IU] via SUBCUTANEOUS

## 2018-08-08 MED ORDER — MORPHINE SULFATE (PF) 2 MG/ML IV SOLN
2.0000 mg | INTRAVENOUS | Status: DC | PRN
  Administered 2018-08-08 – 2018-08-09 (×3): 2 mg via INTRAVENOUS
  Filled 2018-08-08 (×4): qty 1

## 2018-08-08 MED ORDER — 0.9 % SODIUM CHLORIDE (POUR BTL) OPTIME
TOPICAL | Status: DC | PRN
  Administered 2018-08-08: 14:00:00 3000 mL

## 2018-08-08 MED ORDER — GENTAMICIN SULFATE 40 MG/ML IJ SOLN
1.5000 mg/kg | INTRAVENOUS | Status: AC
  Administered 2018-08-08: 14:00:00 180 mg via INTRAVENOUS
  Filled 2018-08-08: qty 4.5

## 2018-08-08 MED ORDER — PROPOFOL 10 MG/ML IV BOLUS
INTRAVENOUS | Status: AC
  Filled 2018-08-08: qty 20

## 2018-08-08 MED ORDER — HYDROMORPHONE HCL 1 MG/ML IJ SOLN
0.2500 mg | INTRAMUSCULAR | Status: DC | PRN
  Administered 2018-08-08 (×3): 0.5 mg via INTRAVENOUS

## 2018-08-08 MED ORDER — ONDANSETRON HCL 4 MG/2ML IJ SOLN
INTRAMUSCULAR | Status: DC | PRN
  Administered 2018-08-08: 4 mg via INTRAVENOUS

## 2018-08-08 MED ORDER — SUCCINYLCHOLINE CHLORIDE 20 MG/ML IJ SOLN
INTRAMUSCULAR | Status: DC | PRN
  Administered 2018-08-08: 120 mg via INTRAVENOUS

## 2018-08-08 SURGICAL SUPPLY — 53 items
APPLIER CLIP 5 13 M/L LIGAMAX5 (MISCELLANEOUS)
BLADE CLIPPER SURG (BLADE) IMPLANT
BNDG GAUZE ELAST 4 BULKY (GAUZE/BANDAGES/DRESSINGS) ×2 IMPLANT
CANISTER SUCT 3000ML PPV (MISCELLANEOUS) IMPLANT
CHLORAPREP W/TINT 26 (MISCELLANEOUS) ×3 IMPLANT
CLIP APPLIE 5 13 M/L LIGAMAX5 (MISCELLANEOUS) IMPLANT
COVER SURGICAL LIGHT HANDLE (MISCELLANEOUS) ×3 IMPLANT
COVER WAND RF STERILE (DRAPES) ×3 IMPLANT
DERMABOND ADVANCED (GAUZE/BANDAGES/DRESSINGS) ×2
DERMABOND ADVANCED .7 DNX12 (GAUZE/BANDAGES/DRESSINGS) ×1 IMPLANT
DRAPE LAPAROSCOPIC ABDOMINAL (DRAPES) ×3 IMPLANT
ELECT REM PT RETURN 9FT ADLT (ELECTROSURGICAL) ×3
ELECTRODE REM PT RTRN 9FT ADLT (ELECTROSURGICAL) ×1 IMPLANT
GAUZE SPONGE 2X2 8PLY STRL LF (GAUZE/BANDAGES/DRESSINGS) IMPLANT
GLOVE BIO SURGEON STRL SZ7 (GLOVE) ×3 IMPLANT
GLOVE BIOGEL PI IND STRL 7.5 (GLOVE) ×1 IMPLANT
GLOVE BIOGEL PI INDICATOR 7.5 (GLOVE) ×2
GOWN STRL REUS W/ TWL LRG LVL3 (GOWN DISPOSABLE) ×3 IMPLANT
GOWN STRL REUS W/TWL LRG LVL3 (GOWN DISPOSABLE) ×6
HANDLE SUCTION POOLE (INSTRUMENTS) IMPLANT
KIT BASIN OR (CUSTOM PROCEDURE TRAY) ×3 IMPLANT
KIT TURNOVER KIT B (KITS) ×3 IMPLANT
NS IRRIG 1000ML POUR BTL (IV SOLUTION) ×3 IMPLANT
PAD ABD 8X10 STRL (GAUZE/BANDAGES/DRESSINGS) ×2 IMPLANT
PAD ARMBOARD 7.5X6 YLW CONV (MISCELLANEOUS) ×3 IMPLANT
PENCIL SMOKE EVACUATOR (MISCELLANEOUS) ×2 IMPLANT
SCISSORS LAP 5X35 DISP (ENDOMECHANICALS) IMPLANT
SET IRRIG TUBING LAPAROSCOPIC (IRRIGATION / IRRIGATOR) IMPLANT
SET TUBE SMOKE EVAC HIGH FLOW (TUBING) ×3 IMPLANT
SLEEVE ENDOPATH XCEL 5M (ENDOMECHANICALS) ×3 IMPLANT
SPONGE GAUZE 2X2 STER 10/PKG (GAUZE/BANDAGES/DRESSINGS) ×2
SPONGE LAP 18X18 RF (DISPOSABLE) ×2 IMPLANT
STAPLER VISISTAT 35W (STAPLE) ×2 IMPLANT
SUCTION POOLE HANDLE (INSTRUMENTS) ×3
SUT PDS AB 0 CT 36 (SUTURE) ×4 IMPLANT
SUT SILK 2 0 (SUTURE) ×2
SUT SILK 2 0SH CR/8 30 (SUTURE) ×2 IMPLANT
SUT SILK 2-0 18XBRD TIE 12 (SUTURE) IMPLANT
SUT SILK 3 0 (SUTURE) ×2
SUT SILK 3 0SH CR/8 30 (SUTURE) ×2 IMPLANT
SUT SILK 3-0 18XBRD TIE 12 (SUTURE) IMPLANT
SYR BULB IRRIGATION 50ML (SYRINGE) ×2 IMPLANT
TAPE CLOTH SURG 6X10 WHT LF (GAUZE/BANDAGES/DRESSINGS) ×2 IMPLANT
TOWEL GREEN STERILE (TOWEL DISPOSABLE) ×3 IMPLANT
TOWEL GREEN STERILE FF (TOWEL DISPOSABLE) ×3 IMPLANT
TRAY FOLEY MTR SLVR 14FR STAT (SET/KITS/TRAYS/PACK) IMPLANT
TRAY LAPAROSCOPIC MC (CUSTOM PROCEDURE TRAY) ×3 IMPLANT
TROCAR XCEL BLUNT TIP 100MML (ENDOMECHANICALS) IMPLANT
TROCAR XCEL NON-BLD 11X100MML (ENDOMECHANICALS) IMPLANT
TROCAR XCEL NON-BLD 5MMX100MML (ENDOMECHANICALS) ×3 IMPLANT
TUBE CONNECTING 12'X1/4 (SUCTIONS) ×1
TUBE CONNECTING 12X1/4 (SUCTIONS) ×1 IMPLANT
YANKAUER SUCT BULB TIP NO VENT (SUCTIONS) ×2 IMPLANT

## 2018-08-08 NOTE — Progress Notes (Addendum)
Pt called out complaining of stomach upset and feeling nauseated. Hooked pt NGT back to suction and got 955ml out with dark brown/coffee ground material. Will leave pt to intermittent suction. Pt agreed. Will continue to monitor.

## 2018-08-08 NOTE — Anesthesia Preprocedure Evaluation (Signed)
Anesthesia Evaluation  Patient identified by MRN, date of birth, ID band Patient awake    Reviewed: Allergy & Precautions, NPO status , Patient's Chart, lab work & pertinent test results  Airway Mallampati: II  TM Distance: >3 FB Neck ROM: Full    Dental no notable dental hx.    Pulmonary sleep apnea and Continuous Positive Airway Pressure Ventilation ,    Pulmonary exam normal breath sounds clear to auscultation       Cardiovascular hypertension, Normal cardiovascular exam Rhythm:Regular Rate:Normal     Neuro/Psych negative neurological ROS  negative psych ROS   GI/Hepatic negative GI ROS, Neg liver ROS,   Endo/Other  diabetes, Insulin DependentMorbid obesity  Renal/GU Renal InsufficiencyRenal disease  negative genitourinary   Musculoskeletal negative musculoskeletal ROS (+)   Abdominal   Peds negative pediatric ROS (+)  Hematology  (+) anemia ,   Anesthesia Other Findings   Reproductive/Obstetrics negative OB ROS                             Anesthesia Physical Anesthesia Plan  ASA: III  Anesthesia Plan: General   Post-op Pain Management:    Induction: Intravenous, Rapid sequence and Cricoid pressure planned  PONV Risk Score and Plan: 2 and Ondansetron, Dexamethasone and Treatment may vary due to age or medical condition  Airway Management Planned: Oral ETT  Additional Equipment:   Intra-op Plan:   Post-operative Plan: Extubation in OR  Informed Consent: I have reviewed the patients History and Physical, chart, labs and discussed the procedure including the risks, benefits and alternatives for the proposed anesthesia with the patient or authorized representative who has indicated his/her understanding and acceptance.     Dental advisory given  Plan Discussed with: CRNA and Surgeon  Anesthesia Plan Comments:         Anesthesia Quick Evaluation

## 2018-08-08 NOTE — Anesthesia Procedure Notes (Signed)
Procedure Name: Intubation Performed by: Milford Cage, CRNA Pre-anesthesia Checklist: Patient identified, Emergency Drugs available, Suction available and Patient being monitored Patient Re-evaluated:Patient Re-evaluated prior to induction Oxygen Delivery Method: Circle System Utilized Preoxygenation: Pre-oxygenation with 100% oxygen Induction Type: IV induction, Rapid sequence and Cricoid Pressure applied Laryngoscope Size: Mac and 4 Grade View: Grade II Tube type: Oral Tube size: 7.5 mm Number of attempts: 1 Airway Equipment and Method: Stylet and Oral airway Placement Confirmation: ETT inserted through vocal cords under direct vision,  positive ETCO2 and breath sounds checked- equal and bilateral Secured at: 22 cm Tube secured with: Tape Dental Injury: Teeth and Oropharynx as per pre-operative assessment

## 2018-08-08 NOTE — Progress Notes (Signed)
Pt NGT is to clamp. Pt complaining of nausea. PRN zofran was given at 0153. Rechecked on patient and pt stated that he feels much better. Will continue to monitor pt.

## 2018-08-08 NOTE — Progress Notes (Addendum)
[facility]/Trauma Progress Note      Assessment/Plan history of colon cancer status post right hemicolectomy history of prostate cancer diabetes mellitus type 2 Hypertension chronic kidney disease stage III  SBO - having small BM's but no flatus since yesterday - did not do well with clamping trial yesterday, output 2700 in last 24hrs - do not feel pt will open up with conservative therapy - will take pt to the OR likely this afternoon but will discuss with MD for timing  FEN:NPO, NGT, ordered PICC and TPN VTE: SCD's,heparin HE:RDEY Foley:none Follow up:TBD   LOS: 5 days    Subjective: CC: weak  Pt states he got sick to his stomach yesterday and asked to be reconnected to suction. He is feeling a little better today and not having abdominal pain. He states he was having black tarry stools but his last BM was brown. He is having a small amount of flatus but "not as much as I would like".   Objective: Vital signs in last 24 hours: Temp:  [97.7 F (36.5 C)-99.2 F (37.3 C)] 98 F (36.7 C) (07/14 0512) Pulse Rate:  [73-96] 96 (07/14 0512) Resp:  [14-19] 14 (07/14 0512) BP: (94-153)/(63-73) 94/67 (07/14 0512) SpO2:  [94 %-99 %] 96 % (07/14 0512) Last BM Date: 08/07/18  Intake/Output from previous day: 07/13 0701 - 07/14 0700 In: 2300 [I.V.:2300] Out: 3000 [Urine:300; Emesis/NG output:2700] Intake/Output this shift: No intake/output data recorded.  PE: Gen: Alert, NAD, pleasant, cooperative HEENT: NGT in place with dark brown output Pulm: rate and effort normal Abd: Soft,obese,NT/ND,hypoactiveBS, no HSM, well healed previous midline scar Skin: no rashes noted, warm and dry   Anti-infectives: Anti-infectives (From admission, onward)   None      Lab Results:  Recent Labs    08/06/18 0246 08/07/18 1008  WBC 6.5 7.6  HGB 10.5* 9.8*  HCT 29.7* 27.7*  PLT 250 199   BMET Recent Labs    08/07/18 1008 08/08/18 0228  NA 138  141  K 3.5 3.2*  CL 102 102  CO2 26 27  GLUCOSE 195* 197*  BUN 33* 34*  CREATININE 1.82* 1.89*  CALCIUM 8.4* 8.4*   PT/INR No results for input(s): LABPROT, INR in the last 72 hours. CMP     Component Value Date/Time   NA 141 08/08/2018 0228   K 3.2 (L) 08/08/2018 0228   CL 102 08/08/2018 0228   CO2 27 08/08/2018 0228   GLUCOSE 197 (H) 08/08/2018 0228   BUN 34 (H) 08/08/2018 0228   CREATININE 1.89 (H) 08/08/2018 0228   CALCIUM 8.4 (L) 08/08/2018 0228   PROT 6.7 08/04/2018 0746   ALBUMIN 3.6 08/04/2018 0746   AST 22 08/04/2018 0746   ALT 16 08/04/2018 0746   ALKPHOS 43 08/04/2018 0746   BILITOT 0.8 08/04/2018 0746   GFRNONAA 33 (L) 08/08/2018 0228   GFRAA 38 (L) 08/08/2018 0228   Lipase     Component Value Date/Time   LIPASE 31 08/03/2018 1445    Studies/Results: Dg Abd Acute 2+v W 1v Chest  Result Date: 08/07/2018 CLINICAL DATA:  83 year old male with small bowel obstruction EXAM: DG ABDOMEN ACUTE W/ 1V CHEST COMPARISON:  Prior acute abdominal series 08/06/2018 FINDINGS: Gastric tube remains in stable position. The catheter tip overlies the gastric fundus. Persistent elevation of the right hemidiaphragm. Trace atherosclerotic calcifications in the transverse aorta. The lungs are clear. Persistent gaseous distention of multiple loops of small bowel with a maximal diameter of 7 cm. Surgical  clips are present in the left hemiabdomen suggesting prior bowel surgery. Minimal gas visualized in the sigmoid colon and rectum. Persistent mild thickening of the plica consistent with submucosal edema. IMPRESSION: 1. Persistent small bowel obstruction without interval progression or improvement. 2. Gastric tube remains in good position. Electronically Signed   By: Jacqulynn Cadet M.D.   On: 08/07/2018 08:20   Dg Abd Portable 1v  Result Date: 08/08/2018 CLINICAL DATA:  Abdominal distension.  Small bowel dilatation. EXAM: PORTABLE ABDOMEN - 1 VIEW COMPARISON:  July 08, 2018 FINDINGS:  Small bowel dilatation is similar in the interval. No other interval changes. IMPRESSION: Continued small bowel obstruction without significant change. Electronically Signed   By: Dorise Bullion III M.D   On: 08/08/2018 08:15      Kalman Drape , [facility] 08/08/2018, 8:29 AM  Pager: [phone] Mon-Wed, Friday 7:00am-4:30pm Thurs 7am-11:30am  Consults: [phone]

## 2018-08-08 NOTE — Progress Notes (Signed)
Peripherally Inserted Central Catheter/Midline Placement  The IV Nurse has discussed with the patient and/or persons authorized to consent for the patient, the purpose of this procedure and the potential benefits and risks involved with this procedure.  The benefits include less needle sticks, lab draws from the catheter, and the patient may be discharged home with the catheter. Risks include, but not limited to, infection, bleeding, blood clot (thrombus formation), and puncture of an artery; nerve damage and irregular heartbeat and possibility to perform a PICC exchange if needed/ordered by physician.  Alternatives to this procedure were also discussed.  Bard Power PICC patient education guide, fact sheet on infection prevention and patient information card has been provided to patient /or left at bedside.    PICC/Midline Placement Documentation  PICC Double Lumen 08/08/18 PICC Right Brachial 44 cm 0 cm (Active)  Indication for Insertion or Continuance of Line Administration of hyperosmolar/irritating solutions (i.e. TPN, Vancomycin, etc.) 08/08/18 2138  Exposed Catheter (cm) 0 cm 08/08/18 2138  Site Assessment Clean;Dry;Intact 08/08/18 2138  Lumen #1 Status Flushed;Saline locked;Blood return noted 08/08/18 2138  Lumen #2 Status Flushed;Saline locked;Blood return noted 08/08/18 2138  Dressing Type Transparent 08/08/18 2138  Dressing Status Clean;Dry;Intact;Antimicrobial disc in place 08/08/18 2138  Dressing Intervention New dressing 08/08/18 2138  Dressing Change Due 08/15/18 08/08/18 2138       Daniel Odom 08/08/2018, 9:39 PM

## 2018-08-08 NOTE — Transfer of Care (Signed)
Immediate Anesthesia Transfer of Care Note  Patient: Daniel Odom  Procedure(s) Performed: LAPAROSCOPIC LYSIS OF ADHESIONS (N/A Abdomen) Exploratory Laparotomy (N/A Abdomen) Lysis of adhesions with repair of small intestine (N/A Abdomen)  Patient Location: PACU  Anesthesia Type:General  Level of Consciousness: awake  Airway & Oxygen Therapy: Patient Spontanous Breathing  Post-op Assessment: Report given to RN and Post -op Vital signs reviewed and stable  Post vital signs: Reviewed and stable  Last Vitals:  Vitals Value Taken Time  BP 106/60 08/08/18 1632  Temp    Pulse 89 08/08/18 1636  Resp 14 08/08/18 1636  SpO2 100 % 08/08/18 1636  Vitals shown include unvalidated device data.  Last Pain:  Vitals:   08/08/18 1129  TempSrc: Oral  PainSc:          Complications: No apparent anesthesia complications

## 2018-08-08 NOTE — Progress Notes (Signed)
PHARMACY - ADULT TOTAL PARENTERAL NUTRITION CONSULT NOTE   Pharmacy Consult:  TPN Indication:  SBO  Patient Measurements: Height: 5\' 7"  (170.2 cm) Weight: 267 lb 6.7 oz (121.3 kg) IBW/kg (Calculated) : 66.1 TPN AdjBW (KG): 79.9 Body mass index is 41.88 kg/m.  Assessment:  21 YOM presented on 08/03/18 with abdominal pain.  Found to have SBO and was on SB protocol.  NGT clamped on 08/06/18 and patient was having BMs and flatus.  This AM 08/08/18, patient has abdominal pain and nausea, NGT set back to suction and 950mL of dark brown/coffee ground output seen.  Surgery thinks conservative management will not suffice and may require surgery later today.  Patient has minimal PO intake for 6 days and Pharmacy consulted to initiate TPN given expected prolonged NPO status.  Patient reports an intentional 20 lbs weight loss over 7 months (from diet and exercise) and he was eating normally until the day of admission.  GI: NG O/P 2758mL, LBM 7/13.  Bisacodyl Endo: DM2 on insulin 70/30 PTA - CBGs mildly elevated Insulin requirements in the past 24 hours: 14 units Lytes: K 3.2, others WNL (Ca slightly low but expect albumin to be lower now) Renal: CKD3 - SCr 1.89, BUN 34 - LR at 100/hr, UOP 0.1 ml/kg/hr Pulm: sleep apnea - stable on RA Cards: HTN - VSS Hepatobil: LFTs / tbili WNL Neuro: A&O ID: afebrile, WBC WNL - not on abx Heme/Onc: colon cancer post hemicolectomy 1993, possible prostate cancer - hgb 9.8, plts WNL TPN Access: PICC to be placed 7/14 TPN start date: 08/08/18  Nutritional Goals (RD rec pending): 2000-2200 kCal, 115-125gm protein per day  Current Nutrition:  NPO  Plan:  Initiate TPN at 40 ml/hr (goal rate ~85 ml/hr) TPN will provide 57g AA, 134g CHO and 30g ILE for a total of 981 kCal, meeting ~47% of patient's needs Electrolytes in TPN: standard today, Cl:Ac 1:1 Daily multivitamin in TPN; trace elements MWF d/t national shortage Change SSI to moderate Q4H + add 15 units  regular insulin in TPN Continue LR at 100 ml/hr today - if stable post-op, will reduce in AM  KCL x 3 runs Standard TPN labs and nursing care orders  Jacinta Penalver D. Mina Marble, PharmD, BCPS, [place] 08/08/2018, 10:03 AM

## 2018-08-08 NOTE — Op Note (Signed)
Preoperative diagnosis: small bowel obstruction  Postoperative diagnosis: small bowel obstruction from adhesive disease  Procedure: lysis of adhesions for 90 minutes, enterrorhaphy  Surgeon: Gurney Maxin, M.D.  Asst: Jackson Latino  Anesthesia: general  Indications for procedure: Daniel Odom is a 83 y.o. year old male with symptoms of small bowel obstruction that did not improve with bowel protocol.  Description of procedure: The patient was brought into the operative suite. Anesthesia was administered with General endotracheal anesthesia. WHO checklist was applied. The patient was then placed in supine position. The area was prepped and draped in the usual sterile fashion.  Next, a small transverse left subcostal incision was made. A 69mm trocar was used to gain access to the peritoneal cavity by optical entry technique. Pneumoperitoneum was applied with a high flow and low pressure. The laparoscope was reinserted to confirm position.   There were multiple loops of intestine stuck to the abdominal wall as well as omentum stuck to the midline with visible prolene suture.  I was able to place one 5 mm trocar in the left mid abdomen.  Sharp dissection was used to free the omentum from the abdominal wall and some sharp dissection was able be done to 1 of the interloop adhesions of the left lower abdomen. 1 93mm trocar was placed in the left periumbilical area.  Additional sharp dissection was done to free the small intestine away from the lower abdominal wall and this completely freed all adhesions of the abdominal wall.  In looking at the abdomen there was a large proximal loop of small intestine that was dilated and distally looked decompressed with multiple interloop adhesions.  The dilated loop appeared to be going under the interloop adhesions.  Therefore I was concerned that there is the interloop adhesions that was causing the obstruction and decided to convert to open procedure.   I  therefore made a midline incision. Cautery was used to dissect through the subcutaneous tissue and the fascia was entered.  I then performed lysis with sharp and blunt techniques of the interloop adhesions beginning in the left lower quadrant.  Once partially freed I was able to find the distal ileum and work backwards.  Interloop adhesions were quite dense and also adhesed to the left colon.  Fortunately because there so old was able to find a plane and avoid colonic injury.  Finally after mostly interloop adhesions were removed realized that the dilated small intestine was most proximal jejunum and that there was a loop over in the right upper quadrant that was causing the obstruction.  Identified and causing obstruction and lysed it.  This allowed movement of enteric contents distally.  Continuing lysis in that area is able to free the intestine.  Total lysis of adhesions time was 90 minutes.    We ran the intestine from the distal ileum back to the ligament of Treitz and identified 1 full-thickness small intestine injury with a small amount of enteric content leakage.  This was repaired with interrupted silks with full-thickness bites and Lemberted silks.  One other area had a small serosal injury that was repaired with Lembert sutures with 3 oh silks.  Abdomen was then irrigated with warm saline.  The NG-tube was confirmed to be in good placement.   The fascia was then closed with 0 PDS in running fashion.  The skin incisions of the laparoscopic incisions were closed with staples.  The midline incision was left open due to contamination.  All counts are correct.  Patient awoke  from anesthesia brought to PACU in stable condition.  Findings: large amount of interloop adhesions with transition in right upper quadrant due to adhesive band.  Specimen: none  Implant: none   Blood loss: 17ml  Local anesthesia: none  Complications: none  Gurney Maxin, M.D. General, Bariatric, & Minimally Invasive  [facility]

## 2018-08-08 NOTE — Progress Notes (Signed)
Returned to room 6n3 from PACU

## 2018-08-08 NOTE — Progress Notes (Addendum)
PROGRESS NOTE  Daniel Odom YDX:[id number] DOB: 1936-01-14 DOA: 08/03/2018 PCP: [facility]  Brief History   83 year old man PMH colon cancer status post right hemicolectomy presented with abdominal pain.  Admitted for small bowel obstruction.  Failed conservative management.  Underwent operative intervention 7/14.  A & P  Small bowel obstruction.  PMH colon cancer status post right hemicolectomy. --Continues to have nausea and high output from NG tube.  Has failed conservative management.  Plan is for surgery today.  Diabetes mellitus type 2 --Remained stable.  CKD stage III --Remains stable.   Creatinine was 1.87 in February 07, 2017.    Anemia, likely anemia from CKD. --Has been stable.  No further evaluation suggested.  Essential hypertension --Has been stable off medications  OSA --Continue CPAP each night as tolerated  DVT prophylaxis: heparin, SCDs Code Status: Full Family Communication: I discussed with the patient's wife by telephone by the patient's request Disposition Plan: home    [name], MD  [facility] Direct contact: see [email or web address] (further directions at bottom of note if needed) 7PM-7AM contact night coverage as at bottom of note 08/08/2018, 4:07 PM  LOS: 5 days   Consultants  . General surgery  Procedures  .   Antibiotics  .   Interval History/Subjective  Reports 2 bowel movements overnight but still had significant nausea.  No pain.  Objective   Vitals:  Vitals:   08/08/18 0512 08/08/18 1129  BP: 94/67 (!) 106/49  Pulse: 96 (!) 101  Resp: 14 14  Temp: 98 F (36.7 C) 98.2 F (36.8 C)  SpO2: 96% 99%    Exam:  Constitutional:   . Appears calm and comfortable Respiratory:  . CTA bilaterally, no w/r/r.  . Respiratory effort normal.  Cardiovascular:  . RRR, no m/r/g . No LE extremity edema   Abdomen:  . Soft Psychiatric:  . Mental status o Mood, affect appropriate  I have personally reviewed the  following:   Today's Data  Urine output not completely recorded NG output 2700 CBG stable Potassium 3.2 Creatinine stable at 1.89 Phosphorus 3.2 Magnesium 1.9  Imaging  . CT abdomen pelvis reviewed.  Cardiology Data  . Independent review: EKG 7/9 shows sinus rhythm with first-degree AV block.  EKG 7/10 narrow complex rhythm within normal rate.  Not atrial fibrillation.  No evidence of flutter.  Suspect sinus rhythm.  Other Data  .   Scheduled Meds: . [MAR Hold] bisacodyl  10 mg Rectal Daily  . [MAR Hold] heparin injection (subcutaneous)  5,000 Units Subcutaneous Q8H  . [MAR Hold] insulin aspart  0-15 Units Subcutaneous Q4H  . [MAR Hold] lip balm  1 application Topical BID  . [MAR Hold] sodium chloride flush  3 mL Intravenous Once   Continuous Infusions: . [MAR Hold] chlorproMAZINE (THORAZINE) IV    . lactated ringers 100 mL/hr at 08/08/18 0016  . lactated ringers    . [MAR Hold] methocarbamol (ROBAXIN) IV    . [MAR Hold] ondansetron (ZOFRAN) IV    . TPN ADULT (ION)      Principal Problem:   SBO (small bowel obstruction) (HCC) Active Problems:   History of colon cancer   Diabetes mellitus type 2 in obese [facility])   Essential hypertension   CKD (chronic kidney disease) stage 3, GFR 30-59 ml/min (HCC)   Anemia associated with chronic renal failure   Hypertension   Diabetes mellitus without complication (HCC)   Mobitz type 1 second degree atrioventricular block   Morbid obesity with body  mass index of 40.0-49.9 (HCC)   LOS: 5 days   How to contact the [facility] Attending or Consulting provider [place] or covering provider during after hours [place], for this patient?  1. Check the care team in [facility] and look for a) attending/consulting TRH provider listed and b) the [facility] team listed 2. Log into [email or web address] and use [place]'s universal password to access. If you do not have the password, please contact the hospital operator. 3. Locate the [facility] provider you are looking for  under [facility] and page to a number that you can be directly reached. 4. If you still have difficulty reaching the provider, please page the [facility] (Director on Call) for the Hospitalists listed on amion for assistance.

## 2018-08-09 ENCOUNTER — Encounter (HOSPITAL_COMMUNITY): Payer: Self-pay | Admitting: General Surgery

## 2018-08-09 LAB — CBC
HCT: 23.7 % — ABNORMAL LOW (ref 39.0–52.0)
Hemoglobin: 8.4 g/dL — ABNORMAL LOW (ref 13.0–17.0)
MCH: 29.1 pg (ref 26.0–34.0)
MCHC: 35.4 g/dL (ref 30.0–36.0)
MCV: 82 fL (ref 80.0–100.0)
Platelets: 203 10*3/uL (ref 150–400)
RBC: 2.89 MIL/uL — ABNORMAL LOW (ref 4.22–5.81)
RDW: 14.2 % (ref 11.5–15.5)
WBC: 9.3 10*3/uL (ref 4.0–10.5)
nRBC: 0 % (ref 0.0–0.2)

## 2018-08-09 LAB — DIFFERENTIAL
Abs Immature Granulocytes: 0.2 10*3/uL — ABNORMAL HIGH (ref 0.00–0.07)
Basophils Absolute: 0 10*3/uL (ref 0.0–0.1)
Basophils Relative: 0 %
Eosinophils Absolute: 0 10*3/uL (ref 0.0–0.5)
Eosinophils Relative: 0 %
Lymphocytes Relative: 8 %
Lymphs Abs: 0.7 10*3/uL (ref 0.7–4.0)
Metamyelocytes Relative: 1 %
Monocytes Absolute: 0 10*3/uL — ABNORMAL LOW (ref 0.1–1.0)
Monocytes Relative: 0 %
Myelocytes: 1 %
Neutro Abs: 8.4 10*3/uL — ABNORMAL HIGH (ref 1.7–7.7)
Neutrophils Relative %: 90 %
nRBC: 0 /100 WBC

## 2018-08-09 LAB — GLUCOSE, CAPILLARY
Glucose-Capillary: 260 mg/dL — ABNORMAL HIGH (ref 70–99)
Glucose-Capillary: 220 mg/dL — ABNORMAL HIGH (ref 70–99)
Glucose-Capillary: 240 mg/dL — ABNORMAL HIGH (ref 70–99)
Glucose-Capillary: 235 mg/dL — ABNORMAL HIGH (ref 70–99)
Glucose-Capillary: 234 mg/dL — ABNORMAL HIGH (ref 70–99)

## 2018-08-09 LAB — COMPREHENSIVE METABOLIC PANEL
ALT: 17 U/L (ref 0–44)
AST: 21 U/L (ref 15–41)
Albumin: 2.9 g/dL — ABNORMAL LOW (ref 3.5–5.0)
Alkaline Phosphatase: 31 U/L — ABNORMAL LOW (ref 38–126)
Anion gap: 13 (ref 5–15)
BUN: 54 mg/dL — ABNORMAL HIGH (ref 8–23)
CO2: 27 mmol/L (ref 22–32)
Calcium: 7.8 mg/dL — ABNORMAL LOW (ref 8.9–10.3)
Chloride: 99 mmol/L (ref 98–111)
Creatinine, Ser: 3.46 mg/dL — ABNORMAL HIGH (ref 0.61–1.24)
GFR calc Af Amer: 18 mL/min — ABNORMAL LOW (ref >60)
GFR calc non Af Amer: 16 mL/min — ABNORMAL LOW (ref >60)
Glucose, Bld: 292 mg/dL — ABNORMAL HIGH (ref 70–99)
Potassium: 3.7 mmol/L (ref 3.5–5.1)
Sodium: 139 mmol/L (ref 135–145)
Total Bilirubin: 0.8 mg/dL (ref 0.3–1.2)
Total Protein: 5.3 g/dL — ABNORMAL LOW (ref 6.5–8.1)

## 2018-08-09 LAB — PHOSPHORUS: Phosphorus: 5.5 mg/dL — ABNORMAL HIGH (ref 2.5–4.6)

## 2018-08-09 LAB — PREALBUMIN: Prealbumin: 15.6 mg/dL — ABNORMAL LOW (ref 18–38)

## 2018-08-09 LAB — TRIGLYCERIDES: Triglycerides: 127 mg/dL (ref <150)

## 2018-08-09 LAB — MAGNESIUM: Magnesium: 1.8 mg/dL (ref 1.7–2.4)

## 2018-08-09 MED ORDER — LACTATED RINGERS IV SOLN
INTRAVENOUS | Status: DC
  Administered 2018-08-09 – 2018-08-10 (×2): via INTRAVENOUS

## 2018-08-09 MED ORDER — TRAVASOL 10 % IV SOLN
INTRAVENOUS | Status: AC
  Filled 2018-08-09: qty 849.6

## 2018-08-09 MED ORDER — INSULIN GLARGINE 100 UNIT/ML ~~LOC~~ SOLN
5.0000 [IU] | Freq: Every day | SUBCUTANEOUS | Status: DC
  Administered 2018-08-09 – 2018-08-10 (×2): 5 [IU] via SUBCUTANEOUS
  Filled 2018-08-09 (×2): qty 0.05

## 2018-08-09 MED ORDER — SODIUM CHLORIDE 0.9 % IV BOLUS
500.0000 mL | Freq: Once | INTRAVENOUS | Status: AC
  Administered 2018-08-09: 500 mL via INTRAVENOUS

## 2018-08-09 MED ORDER — INSULIN ASPART 100 UNIT/ML ~~LOC~~ SOLN
0.0000 [IU] | SUBCUTANEOUS | Status: DC
  Administered 2018-08-09 (×3): 7 [IU] via SUBCUTANEOUS
  Administered 2018-08-10: 11 [IU] via SUBCUTANEOUS
  Administered 2018-08-10: 7 [IU] via SUBCUTANEOUS
  Administered 2018-08-10: 11 [IU] via SUBCUTANEOUS
  Administered 2018-08-10: 15 [IU] via SUBCUTANEOUS
  Administered 2018-08-10: 4 [IU] via SUBCUTANEOUS
  Administered 2018-08-10: 11 [IU] via SUBCUTANEOUS
  Administered 2018-08-11: 7 [IU] via SUBCUTANEOUS
  Administered 2018-08-11: 11 [IU] via SUBCUTANEOUS
  Administered 2018-08-11: 4 [IU] via SUBCUTANEOUS
  Administered 2018-08-11: 7 [IU] via SUBCUTANEOUS
  Administered 2018-08-11: 11 [IU] via SUBCUTANEOUS
  Administered 2018-08-11 – 2018-08-12 (×3): 7 [IU] via SUBCUTANEOUS
  Administered 2018-08-12: 4 [IU] via SUBCUTANEOUS
  Administered 2018-08-12 (×2): 7 [IU] via SUBCUTANEOUS
  Administered 2018-08-12 – 2018-08-13 (×2): 4 [IU] via SUBCUTANEOUS
  Administered 2018-08-13 (×2): 7 [IU] via SUBCUTANEOUS
  Administered 2018-08-13: 4 [IU] via SUBCUTANEOUS
  Administered 2018-08-13: 7 [IU] via SUBCUTANEOUS
  Administered 2018-08-13: 4 [IU] via SUBCUTANEOUS
  Administered 2018-08-14 (×2): 3 [IU] via SUBCUTANEOUS
  Administered 2018-08-14: 4 [IU] via SUBCUTANEOUS
  Administered 2018-08-14: 3 [IU] via SUBCUTANEOUS
  Administered 2018-08-15: 4 [IU] via SUBCUTANEOUS
  Administered 2018-08-15: 3 [IU] via SUBCUTANEOUS
  Administered 2018-08-15: 4 [IU] via SUBCUTANEOUS
  Administered 2018-08-15: 3 [IU] via SUBCUTANEOUS
  Administered 2018-08-15 – 2018-08-16 (×2): 4 [IU] via SUBCUTANEOUS
  Administered 2018-08-16 (×3): 3 [IU] via SUBCUTANEOUS
  Administered 2018-08-16: 4 [IU] via SUBCUTANEOUS
  Administered 2018-08-16: 7 [IU] via SUBCUTANEOUS
  Administered 2018-08-17 (×4): 3 [IU] via SUBCUTANEOUS

## 2018-08-09 MED ORDER — METOPROLOL TARTRATE 5 MG/5ML IV SOLN: 5.0000 mg | Freq: Four times a day (QID) | INTRAVENOUS | Status: DC | PRN

## 2018-08-09 NOTE — Progress Notes (Signed)
[facility]/Trauma Progress Note  1 Day Post-Op   Assessment/Plan history of colon cancer status post right hemicolectomy history of prostate cancer diabetes mellitus type 2 Hypertension chronic kidney disease stage III  SBO - S/P LOA, enterorrhaphy, Dr. Kieth Brightly, 07/14 - clamp NGT and allow sips from floor. Pt is having flatus  FEN:NGT clamped, sips, TPN VTE: SCD's,heparin AL:PFXT Foley:Dc foley Follow up:Dr. Kinsinger   LOS: 6 days    Subjective: CC: abdominal pain worse with talking  Pt having copious flatus and feels like he needs to have a BM. No issues overnight.   Objective: Vital signs in last 24 hours: Temp:  [97.3 F (36.3 C)-98.9 F (37.2 C)] 97.7 F (36.5 C) (07/15 0450) Pulse Rate:  [88-113] 95 (07/15 0450) Resp:  [10-22] 18 (07/15 0450) BP: (86-115)/(49-62) 108/61 (07/15 0450) SpO2:  [90 %-100 %] 99 % (07/15 0450) Weight:  [117.5 kg-121.3 kg] 117.5 kg (07/15 0450) Last BM Date: 08/07/18  Intake/Output from previous day: 07/14 0701 - 07/15 0700 In: 850 [I.V.:600; IV Piggyback:250] Out: 950 [Urine:250; Emesis/NG output:600; Blood:100] Intake/Output this shift: No intake/output data recorded.  PE: Gen:  Alert, NAD, pleasant, cooperative HEENT: NGT in place with dark brown output Pulm:  Rate and effort normal Abd: Soft, obese, ND, hypoactive BS, incisions C/D/I, mild TTP without guarding, no peritonitis  Skin: no rashes noted, warm and dry   Anti-infectives: Anti-infectives (From admission, onward)   Start     Dose/Rate Route Frequency Ordered Stop   08/08/18 2200  gentamicin (GARAMYCIN) 180 mg in dextrose 5 % 50 mL IVPB     1.5 mg/kg  121.3 kg 109 mL/hr over 30 Minutes Intravenous  Once 08/08/18 1621 08/09/18 0119   08/08/18 2000  clindamycin (CLEOCIN) IVPB 600 mg     600 mg 100 mL/hr over 30 Minutes Intravenous  Once 08/08/18 1621 08/08/18 2020   08/08/18 0945  clindamycin (CLEOCIN) IVPB 600 mg     600 mg 100 mL/hr  over 30 Minutes Intravenous On call to O.R. 08/08/18 0936 08/08/18 1410   08/08/18 0945  gentamicin (GARAMYCIN) 180 mg in dextrose 5 % 50 mL IVPB     1.5 mg/kg  121.3 kg 109 mL/hr over 30 Minutes Intravenous On call to O.R. 08/08/18 0936 08/08/18 1445      Lab Results:  Recent Labs    08/07/18 1008 08/09/18 0159  WBC 7.6 9.3  HGB 9.8* 8.4*  HCT 27.7* 23.7*  PLT 199 203   BMET Recent Labs    08/08/18 0228 08/09/18 0159  NA 141 139  K 3.2* 3.7  CL 102 99  CO2 27 27  GLUCOSE 197* 292*  BUN 34* 54*  CREATININE 1.89* 3.46*  CALCIUM 8.4* 7.8*   PT/INR No results for input(s): LABPROT, INR in the last 72 hours. CMP     Component Value Date/Time   NA 139 08/09/2018 0159   K 3.7 08/09/2018 0159   CL 99 08/09/2018 0159   CO2 27 08/09/2018 0159   GLUCOSE 292 (H) 08/09/2018 0159   BUN 54 (H) 08/09/2018 0159   CREATININE 3.46 (H) 08/09/2018 0159   CALCIUM 7.8 (L) 08/09/2018 0159   PROT 5.3 (L) 08/09/2018 0159   ALBUMIN 2.9 (L) 08/09/2018 0159   AST 21 08/09/2018 0159   ALT 17 08/09/2018 0159   ALKPHOS 31 (L) 08/09/2018 0159   BILITOT 0.8 08/09/2018 0159   GFRNONAA 16 (L) 08/09/2018 0159   GFRAA 18 (L) 08/09/2018 0159   Lipase  Component Value Date/Time   LIPASE 31 08/03/2018 1445    Studies/Results: Dg Abd Portable 1v  Result Date: 08/08/2018 CLINICAL DATA:  Abdominal distension.  Small bowel dilatation. EXAM: PORTABLE ABDOMEN - 1 VIEW COMPARISON:  July 08, 2018 FINDINGS: Small bowel dilatation is similar in the interval. No other interval changes. IMPRESSION: Continued small bowel obstruction without significant change. Electronically Signed   By: Dorise Bullion III M.D   On: 08/08/2018 08:15   [place] Ekg Site Rite  Result Date: 08/08/2018 If Site Rite image not attached, placement could not be confirmed due to current cardiac rhythm.     Kalman Drape , [facility] 08/09/2018, 8:40 AM  Pager: [phone] Mon-Wed, Friday  7:00am-4:30pm Thurs 7am-11:30am  Consults: [phone]

## 2018-08-09 NOTE — Progress Notes (Signed)
PHARMACY - ADULT TOTAL PARENTERAL NUTRITION CONSULT NOTE   Pharmacy Consult:  TPN Indication:  SBO  Patient Measurements: Height: 5' 7.01" (170.2 cm) Weight: 259 lb 0.7 oz (117.5 kg) IBW/kg (Calculated) : 66.12 TPN AdjBW (KG): 79.9 Body mass index is 40.56 kg/m.  Assessment:  7 YOM presented on 08/03/18 with abdominal pain.  Found to have SBO and was on SB protocol.  NGT clamped on 08/06/18 and patient was having BMs and flatus.  This AM 08/08/18, patient has abdominal pain and nausea, NGT set back to suction and 953mL of dark brown/coffee ground output seen.  Surgery thinks conservative management will not suffice and may require surgery later today.  Patient has minimal PO intake for 6 days and Pharmacy consulted to initiate TPN given expected prolonged NPO status.  Patient reports an intentional 20 lbs weight loss over 7 months (from diet and exercise) and he was eating normally until the day of admission.  GI: NG O/P 653mL, LBM 7/13.  Bisacodyl - prealbumin 15.6 S/p OR 7/14 for lysis of adhesion in SB Clamp NGT 7/15 - allowing sips Endo: DM2 on insulin 70/30 PTA - CBGs in 200s after TPN start Insulin requirements in the past 24 hours: 13 units - post TPN start Lytes: K 3.7, Mg 1.8, Phos 5.5, CoCa 8.7 Renal: CKD3 - SCr 1.89>>3.46 Minimal UOP BUN 54 Pulm: sleep apnea - stable on RA Cards: HTN - VSS Hepatobil: LFTs / tbili WNL Trigs 127 Neuro: A&O ID: [email or web address], WBC WNL - not on abx Heme/Onc: colon cancer post hemicolectomy 1993, possible prostate cancer - hgb 9.8>8.4, plts WNL TPN Access: PICC to be placed 7/14 TPN start date: 08/08/18  Nutritional Goals (RD rec pending): 2000-2200 kCal, 115-125gm protein per day  Current Nutrition:  NPO  Plan:  Increae TPN to 60 ml/hr (goal rate ~85 ml/hr) TPN will provide 85g AA, 201g CHO and 45g ILE for a total of 1472 kCal, meeting ~75% of patient's needs  Electrolytes in TPN: standard today, Cl:Ac 1:1; Reduce phos  Daily multivitamin  in TPN; trace elements MWF d/t national shortage Change SSI to resistant Q4H + add 20 units regular insulin in TPN  Reduce LR to 40 ml/hr today  Standard TPN labs in am  Levester Fresh, PharmD, BCPS, BCCCP Clinical Pharmacist [phone]  Please check AMION for all [place] numbers  08/09/2018 8:44 AM

## 2018-08-09 NOTE — Progress Notes (Addendum)
PROGRESS NOTE  Daniel Odom SAY:[id number] DOB: 17-Jun-1935 DOA: 08/03/2018 PCP: [facility]  Brief History   83 year old man PMH colon cancer status post right hemicolectomy presented with abdominal pain.  Admitted for small bowel obstruction.  Failed conservative management.  Underwent operative intervention 7/14.  A & P  Small bowel obstruction status post lysis of adhesions 7/14.  PMH colon cancer status post right hemicolectomy. --General surgery managing  Diabetes mellitus type 2-Home dose NPH 7 AM and 30 p.m. --Sugars 220-290 Add Lantus 5 units, continue every 4 sliding scale coverage  Acute kidney injury superimposed CKD stage III --Worsening likely secondary to gentamicin, perioperative hypotension-discontinued hydralazine-let blood pressure run above 180 .   Creatinine was 1.87 in February 07, 2017.    Foley placed-start Flomax-voiding trial in a.m.-continue saline 100 cc/h and monitor trends  Anemia, likely anemia from CKD. --Has been stable.  No further evaluation suggested.  Essential hypertension complicated by perioperative hypotension --Has been stable off medications --See med changes as above-continue fluids 100 cc/h overnight  OSA --Continue CPAP each night as tolerated  DVT prophylaxis: heparin, SCDs Code Status: Full Family Communication: Disposition Plan: home  Verneita Griffes, MD [facility] 10:47 AM  08/09/2018, 10:42 AM  LOS: 6 days   Consultants  . General surgery  Procedures  .   Antibiotics  .   Interval History/Subjective   Nursing reports retaining fluid and Foley was placed overnight with low output initially but output is picking up now Awake coherent looking younger than stated age in no distress States pain is controllable No nausea and has been asking for liquids  Objective   Vitals:  Vitals:   08/09/18 0450 08/09/18 0904  BP: 108/61 (!) 101/47  Pulse: 95   Resp: 18   Temp: 97.7 F (36.5 C)   SpO2: 99%      Exam: Pleasant younger than stated age arcus senilis poor dentition S1-S2 slightly tachycardic holosystolic murmur left upper sternal edge Large bandage over abdomen No lower extremity edema Chest seems clear    I have personally reviewed the following:   Today's Data   Glucose 290 BUNs/creatinine up from baseline to 54/3.46 Phosphorus 5.5 Anion gap 13 prealbumin 15.6 LFTs relatively normal but total protein is 5.3 Hemoglobin down from 9.8-8.4 Imaging  . CT abdomen pelvis reviewed.  Cardiology Data  . Independent review: EKG 7/9 shows sinus rhythm with first-degree AV block.  EKG 7/10 narrow complex rhythm within normal rate.  Not atrial fibrillation.  No evidence of flutter.  Suspect sinus rhythm.  Other Data  .   Scheduled Meds: . bisacodyl  10 mg Rectal Daily  . heparin injection (subcutaneous)  5,000 Units Subcutaneous Q8H  . insulin aspart  0-20 Units Subcutaneous Q4H  . lip balm  1 application Topical BID   Continuous Infusions: . chlorproMAZINE (THORAZINE) IV    . lactated ringers 100 mL/hr at 08/08/18 1945  . lactated ringers    . methocarbamol (ROBAXIN) IV 1,000 mg (08/09/18 0547)  . ondansetron (ZOFRAN) IV    . TPN ADULT (ION) 40 mL/hr at 08/08/18 2208  . TPN ADULT (ION)      Principal Problem:   SBO (small bowel obstruction) (HCC) Active Problems:   History of colon cancer   Diabetes mellitus type 2 in obese [facility])   Essential hypertension   CKD (chronic kidney disease) stage 3, GFR 30-59 ml/min (HCC)   Anemia associated with chronic renal failure   Hypertension   Diabetes mellitus without complication ([place])  Mobitz type 1 second degree atrioventricular block   Morbid obesity with body mass index of 40.0-49.9 ([place])   LOS: 6 days   Verneita Griffes, MD [facility] 10:42 AM

## 2018-08-09 NOTE — Progress Notes (Signed)
Initial Nutrition Assessment  RD working remotely.  DOCUMENTATION CODES:   Morbid obesity  INTERVENTION:   - TPN management per pharmacy  Estimated Nutritional Needs:  Kcal:  2150-2350 Protein:  110-125 grams Fluid:  >/= 2.0 L  - RD will monitor for diet advancement and supplement as appropriate  NUTRITION DIAGNOSIS:   Inadequate oral intake related to altered GI function as evidenced by NPO status.  GOAL:   Patient will meet greater than or equal to 90% of their needs  MONITOR:   Labs, I & O's, Weight trends, Skin, Diet advancement  REASON FOR ASSESSMENT:   Consult New TPN/TNA  ASSESSMENT:   83 year old male who presented to the ED on 7/09 with abdominal pain. PMH of HTN, T2DM, colon cancer s/p right hemicolectomy, prostate cancer, CKD stage III, anemia. CT abdomen showing small bowel obstruction. NG tube placed.  7/12 - NG tube clamping trial, sips of clears 7/14 - NG tube back to suction with 900 ml dark brown/coffee ground output, s/p LOA and enterorrhaphy, TPN initiated  Per Surgery note, plan is to clamp NG tube today and allow sips form the floor.  Unable to reach pt via phone call to pt room. Will attempt to obtain diet and weight history at follow-up.  TPN currently running at 40 ml/hr with plan to increase rate to 60 ml/hr at 1800 today. TPN at 60 ml/hr will provide 1472 kcal and 85 grams of protein. Will also decrease LR to 40 ml/hr.  Pharmacy to reduce phos in TPN due to elevated lab.  No weight history available PTA. Weight down a total of 8 lbs since admission. Pt is net negative 2.6 L. Will continue to monitor trends.  Medications reviewed and include: Dulcolax, SSI q 4 hours, Lantus 5 units daily, IV abx, IV Zofran IVF: LR @ 100 ml/hr  Labs reviewed: phosphorus 5.5, hemoglobin 8.4 CBG's: 206-260 x 24 hours  UOP: 250 ml x 24 hours NGT: 600 ml x 24 hours I/O's: -2.6 L since admit  NUTRITION - FOCUSED PHYSICAL EXAM:  Unable to complete at  this time. RD working remotely.  Diet Order:   Diet Order            Diet NPO time specified Except for: Ice Chips  Diet effective now              EDUCATION NEEDS:   No education needs have been identified at this time  Skin:  Skin Assessment: Skin Integrity Issues: Incisions: closed incision to abdomen  Last BM:  08/07/18  Height:   Ht Readings from Last 1 Encounters:  08/08/18 5' 7.01" (1.702 m)    Weight:   Wt Readings from Last 1 Encounters:  08/09/18 117.5 kg    Ideal Body Weight:  67.3 kg  BMI:  Body mass index is 40.56 kg/m.  Estimated Nutritional Needs:   Kcal:  2150-2350  Protein:  110-125 grams  Fluid:  >/= 2.0 L    [name], MS, RD, LDN Inpatient Clinical Dietitian Pager: [phone] Weekend/After Hours: [phone]

## 2018-08-09 NOTE — Progress Notes (Signed)
Patient with 50cc urine output in Foley since 1900. Paged clinician on call x4. 500cc IV NS bolus ordered.

## 2018-08-09 NOTE — Anesthesia Postprocedure Evaluation (Signed)
Anesthesia Post Note  Patient: Daniel Odom  Procedure(s) Performed: LAPAROSCOPIC LYSIS OF ADHESIONS (N/A Abdomen) Exploratory Laparotomy (N/A Abdomen) Lysis of adhesions with repair of small intestine (N/A Abdomen)     Patient location during evaluation: PACU Anesthesia Type: General Level of consciousness: awake and alert Pain management: pain level controlled Vital Signs Assessment: post-procedure vital signs reviewed and stable Respiratory status: spontaneous breathing, nonlabored ventilation, respiratory function stable and patient connected to nasal cannula oxygen Cardiovascular status: blood pressure returned to baseline and stable Postop Assessment: no apparent nausea or vomiting Anesthetic complications: no    Last Vitals:  Vitals:   08/09/18 0121 08/09/18 0450  BP: (!) 110/49 108/61  Pulse: 100 95  Resp: 18 18  Temp: 36.8 C 36.5 C  SpO2: 95% 99%    Last Pain:  Vitals:   08/09/18 0450  TempSrc: Oral  PainSc:                  Orvel Cutsforth S

## 2018-08-10 LAB — GLUCOSE, CAPILLARY
Glucose-Capillary: 193 mg/dL — ABNORMAL HIGH (ref 70–99)
Glucose-Capillary: 226 mg/dL — ABNORMAL HIGH (ref 70–99)
Glucose-Capillary: 239 mg/dL — ABNORMAL HIGH (ref 70–99)
Glucose-Capillary: 261 mg/dL — ABNORMAL HIGH (ref 70–99)
Glucose-Capillary: 269 mg/dL — ABNORMAL HIGH (ref 70–99)
Glucose-Capillary: 285 mg/dL — ABNORMAL HIGH (ref 70–99)
Glucose-Capillary: 301 mg/dL — ABNORMAL HIGH (ref 70–99)

## 2018-08-10 LAB — COMPREHENSIVE METABOLIC PANEL
ALT: 17 U/L (ref 0–44)
AST: 18 U/L (ref 15–41)
Albumin: 2.6 g/dL — ABNORMAL LOW (ref 3.5–5.0)
Alkaline Phosphatase: 37 U/L — ABNORMAL LOW (ref 38–126)
Anion gap: 12 (ref 5–15)
BUN: 56 mg/dL — ABNORMAL HIGH (ref 8–23)
CO2: 28 mmol/L (ref 22–32)
Calcium: 8 mg/dL — ABNORMAL LOW (ref 8.9–10.3)
Chloride: 102 mmol/L (ref 98–111)
Creatinine, Ser: 3.19 mg/dL — ABNORMAL HIGH (ref 0.61–1.24)
GFR calc Af Amer: 20 mL/min — ABNORMAL LOW (ref >60)
GFR calc non Af Amer: 17 mL/min — ABNORMAL LOW (ref >60)
Glucose, Bld: 296 mg/dL — ABNORMAL HIGH (ref 70–99)
Potassium: 3.7 mmol/L (ref 3.5–5.1)
Sodium: 142 mmol/L (ref 135–145)
Total Bilirubin: 0.5 mg/dL (ref 0.3–1.2)
Total Protein: 5.6 g/dL — ABNORMAL LOW (ref 6.5–8.1)

## 2018-08-10 LAB — CBC WITH DIFFERENTIAL/PLATELET
Abs Immature Granulocytes: 0.14 10*3/uL — ABNORMAL HIGH (ref 0.00–0.07)
Basophils Absolute: 0 10*3/uL (ref 0.0–0.1)
Basophils Relative: 0 %
Eosinophils Absolute: 0.1 10*3/uL (ref 0.0–0.5)
Eosinophils Relative: 1 %
HCT: 21.1 % — ABNORMAL LOW (ref 39.0–52.0)
Hemoglobin: 7.4 g/dL — ABNORMAL LOW (ref 13.0–17.0)
Immature Granulocytes: 1 %
Lymphocytes Relative: 9 %
Lymphs Abs: 0.9 10*3/uL (ref 0.7–4.0)
MCH: 29.2 pg (ref 26.0–34.0)
MCHC: 35.1 g/dL (ref 30.0–36.0)
MCV: 83.4 fL (ref 80.0–100.0)
Monocytes Absolute: 0.7 10*3/uL (ref 0.1–1.0)
Monocytes Relative: 7 %
Neutro Abs: 9 10*3/uL — ABNORMAL HIGH (ref 1.7–7.7)
Neutrophils Relative %: 82 %
Platelets: 168 10*3/uL (ref 150–400)
RBC: 2.53 MIL/uL — ABNORMAL LOW (ref 4.22–5.81)
RDW: 14.6 % (ref 11.5–15.5)
WBC: 10.9 10*3/uL — ABNORMAL HIGH (ref 4.0–10.5)
nRBC: 0 % (ref 0.0–0.2)

## 2018-08-10 LAB — PHOSPHORUS: Phosphorus: 3.3 mg/dL (ref 2.5–4.6)

## 2018-08-10 LAB — MAGNESIUM: Magnesium: 2 mg/dL (ref 1.7–2.4)

## 2018-08-10 MED ORDER — TRAVASOL 10 % IV SOLN
INTRAVENOUS | Status: AC
  Administered 2018-08-10: 17:00:00 via INTRAVENOUS
  Filled 2018-08-10: qty 849.6

## 2018-08-10 MED ORDER — INSULIN GLARGINE 100 UNIT/ML ~~LOC~~ SOLN
8.0000 [IU] | Freq: Every day | SUBCUTANEOUS | Status: DC
  Administered 2018-08-11: 8 [IU] via SUBCUTANEOUS
  Filled 2018-08-10 (×2): qty 0.08

## 2018-08-10 NOTE — Plan of Care (Signed)
  Problem: Activity: Goal: Risk for activity intolerance will decrease Outcome: Progressing   

## 2018-08-10 NOTE — Progress Notes (Signed)
[facility]/Trauma Progress Note  2 Days Post-Op   Assessment/Plan history of colon cancer status post right hemicolectomy history of prostate cancer diabetes mellitus type 2 Hypertension chronic kidney disease stage III  SBO -S/P LOA, enterorrhaphy, Dr. Kieth Brightly, 07/14 - pt tolerated NGT clamping and sips, pull NGT and allow FLD - continue wet to dry dressing changes  FEN:FLD, TPN until pt is tolerating a soft diet VTE: SCD's,heparin MC:NOBS Foley:Dc foley 07/15 Follow up:Dr. Kinsinger   LOS: 7 days    Subjective: CC: abdominal soreness  Pt is having flatus. He is using his IS. He is tolerating clears with his NGT clamped. Pain well controlled. No hiccups.   Objective: Vital signs in last 24 hours: Temp:  [98.7 F (37.1 C)-99.5 F (37.5 C)] 98.9 F (37.2 C) (07/16 0420) Pulse Rate:  [101] 101 (07/16 0420) Resp:  [18] 18 (07/16 0420) BP: (120-130)/(58-61) 120/60 (07/16 0420) SpO2:  [92 %-94 %] 94 % (07/16 0420) Last BM Date: 08/07/18  Intake/Output from previous day: 07/15 0701 - 07/16 0700 In: 212.8 [I.V.:162.8; IV Piggyback:50] Out: 9628 [Urine:1325; Emesis/NG output:400] Intake/Output this shift: No intake/output data recorded.  PE: Gen:  Alert, NAD, pleasant, cooperative Pulm:  Rate and effort normal Abd: Soft, obese, ND, + BS and tinkling, incisions C/D/I, mild TTP without guarding, no peritonitis  Skin: no rashes noted, warm and dry   Anti-infectives: Anti-infectives (From admission, onward)   Start     Dose/Rate Route Frequency Ordered Stop   08/08/18 2200  gentamicin (GARAMYCIN) 180 mg in dextrose 5 % 50 mL IVPB     1.5 mg/kg  121.3 kg 109 mL/hr over 30 Minutes Intravenous  Once 08/08/18 1621 08/09/18 0119   08/08/18 2000  clindamycin (CLEOCIN) IVPB 600 mg     600 mg 100 mL/hr over 30 Minutes Intravenous  Once 08/08/18 1621 08/08/18 2020   08/08/18 0945  clindamycin (CLEOCIN) IVPB 600 mg     600 mg 100 mL/hr over 30  Minutes Intravenous On call to O.R. 08/08/18 0936 08/08/18 1410   08/08/18 0945  gentamicin (GARAMYCIN) 180 mg in dextrose 5 % 50 mL IVPB     1.5 mg/kg  121.3 kg 109 mL/hr over 30 Minutes Intravenous On call to O.R. 08/08/18 0936 08/08/18 1445      Lab Results:  Recent Labs    08/09/18 0159 08/10/18 0412  WBC 9.3 10.9*  HGB 8.4* 7.4*  HCT 23.7* 21.1*  PLT 203 168   BMET Recent Labs    08/09/18 0159 08/10/18 0412  NA 139 142  K 3.7 3.7  CL 99 102  CO2 27 28  GLUCOSE 292* 296*  BUN 54* 56*  CREATININE 3.46* 3.19*  CALCIUM 7.8* 8.0*   PT/INR No results for input(s): LABPROT, INR in the last 72 hours. CMP     Component Value Date/Time   NA 142 08/10/2018 0412   K 3.7 08/10/2018 0412   CL 102 08/10/2018 0412   CO2 28 08/10/2018 0412   GLUCOSE 296 (H) 08/10/2018 0412   BUN 56 (H) 08/10/2018 0412   CREATININE 3.19 (H) 08/10/2018 0412   CALCIUM 8.0 (L) 08/10/2018 0412   PROT 5.6 (L) 08/10/2018 0412   ALBUMIN 2.6 (L) 08/10/2018 0412   AST 18 08/10/2018 0412   ALT 17 08/10/2018 0412   ALKPHOS 37 (L) 08/10/2018 0412   BILITOT 0.5 08/10/2018 0412   GFRNONAA 17 (L) 08/10/2018 0412   GFRAA 20 (L) 08/10/2018 0412   Lipase  Component Value Date/Time   LIPASE 31 08/03/2018 1445    Studies/Results: No results found.    Kalman Drape , [facility] Surgery 08/10/2018, 10:23 AM  Pager: [phone] Mon-Wed, Friday 7:00am-4:30pm Thurs 7am-11:30am  Consults: [phone]

## 2018-08-10 NOTE — Progress Notes (Signed)
PROGRESS NOTE  Daniel Odom NUU:[id number] DOB: [birth year] DOA: 08/03/2018 PCP: [facility]  Brief History   83 year old man PMH colon cancer status post right hemicolectomy presented with abdominal pain.  Admitted for small bowel obstruction.  Failed conservative management.  Underwent operative intervention 7/14.  A & P  Small bowel obstruction status post lysis of adhesions 7/14.  PMH colon cancer status post right hemicolectomy. --General surgery managing --Continue TPN diet as per surgery graduation  Diabetes mellitus type 2-Home dose NPH 7 AM and 30 p.m. --Sugars as high as 300 this morning Increase Lantus to 8 units daily, continue every 4 sliding scale coverage  Acute kidney injury superimposed CKD stage III Prostate cancer + radiation + salvage cryoablation [place]-follows with Dr. Morrison Old --- last visit 07/08/2016 suggested microscopic prostate cancer Underlying imaging incontinence --Worsening likely secondary to gentamicin, perioperative hypotension-discontinued hydralazine-let blood pressure run above 180 .Creatinine was 1.87 in February 07, 2017.    Foley catheter pulled, continue Flomax, bladder scans PVRs to determine if any residual and if so may need to have catheter replaced versus intermittent cath by patient  Anemia, likely anemia from CKD. --Drop in hemoglobin perioperatively-monitor and transfusion trigger less than 7.0  Essential hypertension complicated by perioperative hypotension --Has been stable off medications --See med changes as above-continue fluids 100 cc/h overnight  OSA --Continue CPAP each night as tolerated  DVT prophylaxis: heparin, SCDs Code Status: Full Family Communication: Disposition Plan: home  Verneita Griffes, MD [facility] 11:14 AM  08/10/2018, 11:14 AM  LOS: 7 days   Consultants  . General surgery  Procedures  .   Antibiotics  .   Interval History/Subjective   Awake coherent no distress Pain  seems controlled Tolerating some diet with liquids no vomiting NG tube drainage seems dark Overall seems about the same as yesterday  Objective   Vitals:  Vitals:   08/09/18 2003 08/10/18 0420  BP: (!) 128/58 120/60  Pulse: (!) 101 (!) 101  Resp: 18 18  Temp: 98.7 F (37.1 C) 98.9 F (37.2 C)  SpO2: 92% 94%    Exam:  Pleasant alert coherent no distress slightly tremulous NG tube in place No icterus no pallor no submandibular adenopathy S1-S2 no murmur Abdomen soft however large reinforce dressing over top of abdomen and wound not reviewed No lower extremity edema Neurologically motor is 5/5 to major muscle groups sensory is grossly intact    I have personally reviewed the following:   Today's Data   BUNs/creatinine up from baseline to 54/3.46-->56/3.1, Phosphorus 5.5 down to 3.3, calcium 8.0 but with correction is normal Hemoglobin down from 9.8-8.4 currently 7.4-watch trends Imaging  . CT abdomen pelvis reviewed. Other Data  .   Scheduled Meds: . bisacodyl  10 mg Rectal Daily  . heparin injection (subcutaneous)  5,000 Units Subcutaneous Q8H  . insulin aspart  0-20 Units Subcutaneous Q4H  . insulin glargine  5 Units Subcutaneous Daily  . lip balm  1 application Topical BID   Continuous Infusions: . lactated ringers 40 mL/hr at 08/09/18 2200  . methocarbamol (ROBAXIN) IV 1,000 mg (08/10/18 0546)  . ondansetron (ZOFRAN) IV    . TPN ADULT (ION)    . TPN ADULT (ION)      Principal Problem:   SBO (small bowel obstruction) (HCC) Active Problems:   History of colon cancer   Diabetes mellitus type 2 in obese [facility])   Essential hypertension   CKD (chronic kidney disease) stage 3, GFR 30-59 ml/min ([place])  Anemia associated with chronic renal failure   Hypertension   Diabetes mellitus without complication (HCC)   Mobitz type 1 second degree atrioventricular block   Morbid obesity with body mass index of 40.0-49.9 ([place])   LOS: 7 days   Verneita Griffes, MD  [facility] 11:14 AM

## 2018-08-10 NOTE — Progress Notes (Signed)
RN consulted CN on order to clamp foley. CN verbalized to RN to leave foley unclamped until further instructions from MD. Will continue to monitor.

## 2018-08-10 NOTE — TOC Initial Note (Addendum)
Transition of Care [facility]) - Initial/Assessment Note    Patient Details  Name: Daniel Odom MRN: [id number] Date of Birth: May 28, 1935  Transition of Care [facility]) CM/SW Contact:    Marilu Favre, RN Phone Number: 08/10/2018, 3:49 PM  Clinical Narrative:                 Patient from home with wife. Confirmed face sheet information.   Patient[facility] at [facility]. Patient states he also has Medicare which he wants to use to pay hospital bill and if home health is needed. His Medicare card is in his wallet which is  downstairs in security.   Patient does have wet to dry dressing change every shift. Patient aware he and his wife will have to learn how to do dressing changes prior to discharge.   [facility] [phone] aware patient was admitted to [facility] Notification ID : Viona Gilmore [id number]  Expected Discharge Plan: [place] Barriers to Discharge: Continued Medical Work up   Patient Goals and CMS Choice Patient states their goals for this hospitalization and ongoing recovery are:: to go home CMS Medicare.gov Compare Post Acute Care list provided to:: Patient Choice offered to / list presented to : Patient  Expected Discharge Plan and Services Expected Discharge Plan: [place]   Discharge Planning Services: CM Consult Post Acute Care Choice: [place] arrangements for the past 2 months: Single Family Home                                      Prior Living Arrangements/Services Living arrangements for the past 2 months: Single Family Home Lives with:: Spouse Patient language and need for interpreter reviewed:: Yes Do you feel safe going back to the place where you live?: Yes      Need for Family Participation in Patient Care: Yes (Comment) Care giver support system in place?: Yes (comment)   Criminal Activity/Legal Involvement Pertinent to Current Situation/Hospitalization: No - Comment as needed  Activities  of Daily Living      Permission Sought/Granted                  Emotional Assessment Appearance:: Appears stated age Attitude/Demeanor/Rapport: Engaged Affect (typically observed): Accepting Orientation: : Oriented to Self, Oriented to Place, Oriented to  Time, Oriented to Situation Alcohol / Substance Use: Not Applicable Psych Involvement: No (comment)  Admission diagnosis:  Leg numbness [R20.0] Leg pain, right [M79.604] Patient Active Problem List   Diagnosis Date Noted  . Mobitz type 1 second degree atrioventricular block 08/05/2018  . Morbid obesity with body mass index of 40.0-49.9 ([place]) 08/05/2018  . Hypertension   . Diabetes mellitus without complication ([place])   . SBO (small bowel obstruction) ([place]) 08/03/2018  . History of colon cancer 08/03/2018  . Diabetes mellitus type 2 in obese ([place]) 08/03/2018  . Essential hypertension 08/03/2018  . Anemia associated with chronic renal failure 08/03/2018  . CKD (chronic kidney disease) stage 3, GFR 30-59 ml/min (HCC) 02/07/2017  . OSA on CPAP 02/07/2017  . Tremor, hereditary, benign 02/07/2017  . Asthma 02/07/2017  . 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 Prostate cancer ([place]) 08/08/2016   PCP:  [facility], [place]:   [id number] - [place], [place] [address] [place] [address]  Phone: [phone] Fax: [phone]     Social Determinants of Health (SDOH) Interventions    Readmission Risk Interventions No flowsheet data found.

## 2018-08-10 NOTE — Progress Notes (Signed)
PHARMACY - ADULT TOTAL PARENTERAL NUTRITION CONSULT NOTE   Pharmacy Consult:  TPN Indication:  SBO  Patient Measurements: Height: 5' 7.01" (170.2 cm) Weight: 259 lb 0.7 oz (117.5 kg) IBW/kg (Calculated) : 66.12 TPN AdjBW (KG): 79.9 Body mass index is 40.56 kg/m.  Assessment:  45 YOM presented on 08/03/18 with abdominal pain.  Found to have SBO and was on SB protocol.  NGT clamped on 08/06/18 and patient was having BMs and flatus.  This AM 08/08/18, patient has abdominal pain and nausea, NGT set back to suction and 973mL of dark brown/coffee ground output seen.  Surgery thinks conservative management will not suffice and may require surgery later today.  Patient has minimal PO intake for 6 days and Pharmacy consulted to initiate TPN given expected prolonged NPO status.  Patient reports an intentional 20 lbs weight loss over 7 months (from diet and exercise) and he was eating normally until the day of admission.  GI: NG O/P 422mL, LBM 7/13.  Bisacodyl - prealbumin 15.6 S/p OR 7/14 for lysis of adhesion in SB Clamp NGT 7/15 - allowing sips Endo: DM2 on insulin 70/30 PTA - CBGs uncontrolled Insulin requirements since 1800 yesterday: 47 units novolog + 5 units lantus Lytes: K 3.7, Mg 2, Phos 3.3, CoCa 9.1 Renal: CKD3 - SCr 1.89>>3.19, UOP 0.5 BUN 56 Pulm: sleep apnea - stable on RA Cards: HTN - VSS Hepatobil: LFTs / tbili WNL Trigs 127 Neuro: A&O ID: [email or web address], WBC WNL - not on abx Heme/Onc: colon cancer post hemicolectomy 1993, possible prostate cancer - hgb 9.8>7.4, plts WNL TPN Access: PICC to be placed 7/14 TPN start date: 08/08/18  Nutritional Goals (Per RD 7/15): 2150-2350 kCal, 110-125gm protein per day  Current Nutrition:  NPO TPN  Plan:  Continue TPN at 60 ml/hr (goal rate ~85 ml/hr) - not advancing d/t uncontrolled CBGs TPN will provide 85g AA, 201g CHO and 45g ILE for a total of 1472 kCal, meeting ~68% of patient's caloric needs and 77% protein needs Electrolytes in TPN:  no electrolyte changes, Cl:Ac 1:1 Daily multivitamin in TPN; trace elements MWF d/t national shortage Continue resistant SSI Q4H + increase to 35 units regular insulin in TPN Continue LR at 40 ml/hr today AM BMET, Mg, Phos  Salome Arnt, PharmD, BCPS Please see AMION for all pharmacy numbers 08/10/2018 10:22 AM

## 2018-08-11 ENCOUNTER — Inpatient Hospital Stay (HOSPITAL_COMMUNITY): Payer: No Typology Code available for payment source

## 2018-08-11 LAB — CBC WITH DIFFERENTIAL/PLATELET
Abs Immature Granulocytes: 0.2 10*3/uL — ABNORMAL HIGH (ref 0.00–0.07)
Basophils Absolute: 0 10*3/uL (ref 0.0–0.1)
Basophils Relative: 0 %
Eosinophils Absolute: 0.1 10*3/uL (ref 0.0–0.5)
Eosinophils Relative: 0 %
HCT: 21.9 % — ABNORMAL LOW (ref 39.0–52.0)
Hemoglobin: 7.7 g/dL — ABNORMAL LOW (ref 13.0–17.0)
Immature Granulocytes: 2 %
Lymphocytes Relative: 7 %
Lymphs Abs: 0.9 10*3/uL (ref 0.7–4.0)
MCH: 28.4 pg (ref 26.0–34.0)
MCHC: 35.2 g/dL (ref 30.0–36.0)
MCV: 80.8 fL (ref 80.0–100.0)
Monocytes Absolute: 0.7 10*3/uL (ref 0.1–1.0)
Monocytes Relative: 6 %
Neutro Abs: 10.8 10*3/uL — ABNORMAL HIGH (ref 1.7–7.7)
Neutrophils Relative %: 85 %
Platelets: 189 10*3/uL (ref 150–400)
RBC: 2.71 MIL/uL — ABNORMAL LOW (ref 4.22–5.81)
RDW: 14.3 % (ref 11.5–15.5)
WBC: 12.7 10*3/uL — ABNORMAL HIGH (ref 4.0–10.5)
nRBC: 0 % (ref 0.0–0.2)

## 2018-08-11 LAB — GLUCOSE, CAPILLARY
Glucose-Capillary: 276 mg/dL — ABNORMAL HIGH (ref 70–99)
Glucose-Capillary: 284 mg/dL — ABNORMAL HIGH (ref 70–99)
Glucose-Capillary: 239 mg/dL — ABNORMAL HIGH (ref 70–99)
Glucose-Capillary: 224 mg/dL — ABNORMAL HIGH (ref 70–99)
Glucose-Capillary: 198 mg/dL — ABNORMAL HIGH (ref 70–99)
Glucose-Capillary: 221 mg/dL — ABNORMAL HIGH (ref 70–99)

## 2018-08-11 LAB — COMPREHENSIVE METABOLIC PANEL
ALT: 17 U/L (ref 0–44)
AST: 18 U/L (ref 15–41)
Albumin: 2.7 g/dL — ABNORMAL LOW (ref 3.5–5.0)
Alkaline Phosphatase: 57 U/L (ref 38–126)
Anion gap: 13 (ref 5–15)
BUN: 51 mg/dL — ABNORMAL HIGH (ref 8–23)
CO2: 28 mmol/L (ref 22–32)
Calcium: 8.6 mg/dL — ABNORMAL LOW (ref 8.9–10.3)
Chloride: 102 mmol/L (ref 98–111)
Creatinine, Ser: 2.68 mg/dL — ABNORMAL HIGH (ref 0.61–1.24)
GFR calc Af Amer: 25 mL/min — ABNORMAL LOW (ref >60)
GFR calc non Af Amer: 21 mL/min — ABNORMAL LOW (ref >60)
Glucose, Bld: 277 mg/dL — ABNORMAL HIGH (ref 70–99)
Potassium: 3.2 mmol/L — ABNORMAL LOW (ref 3.5–5.1)
Sodium: 143 mmol/L (ref 135–145)
Total Bilirubin: 0.4 mg/dL (ref 0.3–1.2)
Total Protein: 6.1 g/dL — ABNORMAL LOW (ref 6.5–8.1)

## 2018-08-11 LAB — MAGNESIUM: Magnesium: 2.3 mg/dL (ref 1.7–2.4)

## 2018-08-11 LAB — PHOSPHORUS: Phosphorus: 3.3 mg/dL (ref 2.5–4.6)

## 2018-08-11 MED ORDER — PROMETHAZINE HCL 25 MG/ML IJ SOLN
12.5000 mg | Freq: Once | INTRAMUSCULAR | Status: AC
  Administered 2018-08-11: 12.5 mg via INTRAVENOUS
  Filled 2018-08-11: qty 1

## 2018-08-11 MED ORDER — POTASSIUM CHLORIDE 2 MEQ/ML IV SOLN
INTRAVENOUS | Status: DC
  Administered 2018-08-11 – 2018-08-12 (×3): via INTRAVENOUS
  Filled 2018-08-11 (×3): qty 1000

## 2018-08-11 MED ORDER — TRAVASOL 10 % IV SOLN
INTRAVENOUS | Status: AC
  Administered 2018-08-11: 17:00:00 via INTRAVENOUS
  Filled 2018-08-11: qty 849.6

## 2018-08-11 MED ORDER — KCL-LACTATED RINGERS-D5W 20 MEQ/L IV SOLN
INTRAVENOUS | Status: DC
  Filled 2018-08-11: qty 1000

## 2018-08-11 NOTE — Progress Notes (Signed)
Patient continues to c/o nausea and vomited another 150 cc of green emesis, Md on call notified with new order.

## 2018-08-11 NOTE — Progress Notes (Signed)
Patient vomited 200cc of green emesis. PRN IV zofran given. Will continue to monitor.

## 2018-08-11 NOTE — Progress Notes (Addendum)
[facility]/Trauma Progress Note  3 Days Post-Op   Assessment/Plan history of colon cancer status post right hemicolectomy history of prostate cancer diabetes mellitus type 2 Hypertension chronic kidney disease stage III  SBO -S/P LOA, enterorrhaphy, Dr. Kieth Brightly, 07/14 - pt tolerated NGT clamping and sips, pulled NGT 07/16 and allowed FLD - vomiting overnight, NPO, AXR pending, NGT reinsertion  - continue wet to dry dressing changes - spoke to daughter, Derek [facility] in [place]) today   FEN:NPO,NGT, TPN until pt is tolerating a soft diet VTE: SCD's,heparin EH:MCNO Foley:Dc foley 07/15 Follow up:Dr. Kinsinger    LOS: 8 days    Subjective: CC: N & V  Not much abdominal pain. Per pt, did not drink/eat anything after lunch and had a sour stomach that started last evening. Nurse reports bilious vomiting overnight. Discussed with pt that he may need NGT again.   Objective: Vital signs in last 24 hours: Temp:  [99.3 F (37.4 C)-99.7 F (37.6 C)] 99.3 F (37.4 C) (07/17 0416) Pulse Rate:  [105-108] 105 (07/17 0416) Resp:  [15-16] 16 (07/17 0416) BP: (127-139)/(69-76) 127/76 (07/17 0416) SpO2:  [95 %-97 %] 97 % (07/17 0416) Last BM Date: 08/07/18  Intake/Output from previous day: 07/16 0701 - 07/17 0700 In: 1360.1 [P.O.:60; I.V.:1100.1; IV Piggyback:200] Out: 7096 [Urine:1150; Emesis/NG output:500] Intake/Output this shift: Total I/O In: -  Out: 250 [Emesis/NG output:250]  PE: Gen: Alert, pleasant, cooperative, appears uncomfortable and small amt of clear green tinged emesis  Pulm:Rate andeffort normal Abd: Soft,obese,distention,no BS appreciated, incision with pale subq tissue but without bleeding or purulent drainage,no TTP, no peritonitis Skin: no rashes noted, warm and dry  Anti-infectives: Anti-infectives (From admission, onward)   Start     Dose/Rate Route Frequency Ordered Stop   08/08/18 2200  gentamicin  (GARAMYCIN) 180 mg in dextrose 5 % 50 mL IVPB     1.5 mg/kg  121.3 kg 109 mL/hr over 30 Minutes Intravenous  Once 08/08/18 1621 08/09/18 0119   08/08/18 2000  clindamycin (CLEOCIN) IVPB 600 mg     600 mg 100 mL/hr over 30 Minutes Intravenous  Once 08/08/18 1621 08/08/18 2020   08/08/18 0945  clindamycin (CLEOCIN) IVPB 600 mg     600 mg 100 mL/hr over 30 Minutes Intravenous On call to O.R. 08/08/18 0936 08/08/18 1410   08/08/18 0945  gentamicin (GARAMYCIN) 180 mg in dextrose 5 % 50 mL IVPB     1.5 mg/kg  121.3 kg 109 mL/hr over 30 Minutes Intravenous On call to O.R. 08/08/18 0936 08/08/18 1445      Lab Results:  Recent Labs    08/10/18 0412 08/11/18 0407  WBC 10.9* 12.7*  HGB 7.4* 7.7*  HCT 21.1* 21.9*  PLT 168 189   BMET Recent Labs    08/10/18 0412 08/11/18 0407  NA 142 143  K 3.7 3.2*  CL 102 102  CO2 28 28  GLUCOSE 296* 277*  BUN 56* 51*  CREATININE 3.19* 2.68*  CALCIUM 8.0* 8.6*   PT/INR No results for input(s): LABPROT, INR in the last 72 hours. CMP     Component Value Date/Time   NA 143 08/11/2018 0407   K 3.2 (L) 08/11/2018 0407   CL 102 08/11/2018 0407   CO2 28 08/11/2018 0407   GLUCOSE 277 (H) 08/11/2018 0407   BUN 51 (H) 08/11/2018 0407   CREATININE 2.68 (H) 08/11/2018 0407   CALCIUM 8.6 (L) 08/11/2018 0407   PROT 6.1 (L) 08/11/2018 0407   ALBUMIN 2.7 (  L) 08/11/2018 0407   AST 18 08/11/2018 0407   ALT 17 08/11/2018 0407   ALKPHOS 57 08/11/2018 0407   BILITOT 0.4 08/11/2018 0407   GFRNONAA 21 (L) 08/11/2018 0407   GFRAA 25 (L) 08/11/2018 0407   Lipase     Component Value Date/Time   LIPASE 31 08/03/2018 1445    Studies/Results: No results found.    Kalman Drape , [facility] 08/11/2018, 8:23 AM  Pager: [phone] Mon-Wed, Friday 7:00am-4:30pm Thurs 7am-11:30am  Consults: [phone]

## 2018-08-11 NOTE — Progress Notes (Signed)
Results for KEJUAN, BEKKER (MRN [id number]) as of 08/11/2018 12:18  Ref. Range 08/10/2018 16:45 08/10/2018 20:21 08/10/2018 23:55 08/11/2018 04:13 08/11/2018 08:02  Glucose-Capillary Latest Ref Range: 70 - 99 mg/dL 285 (H) 269 (H) 226 (H) 276 (H) 284 (H)  Noted that blood sugars continue to be greater than 200 mg/dl. Noted that TPN contains 35 units insulin.   If blood sugars continue to be elevated, recommend increasing Lantus to 12 units daily.   Harvel Ricks RN BSN CDE Diabetes Coordinator Pager: [phone]  8am-5pm

## 2018-08-11 NOTE — Progress Notes (Signed)
PHARMACY - ADULT TOTAL PARENTERAL NUTRITION CONSULT NOTE   Pharmacy Consult:  TPN Indication:  SBO  Patient Measurements: Height: 5' 7.01" (170.2 cm) Weight: 259 lb 0.7 oz (117.5 kg) IBW/kg (Calculated) : 66.12 TPN AdjBW (KG): 79.9 Body mass index is 40.56 kg/m.  Assessment:  4 YOM presented on 08/03/18 with abdominal pain.  Found to have SBO and was on SB protocol.  NGT clamped on 08/06/18 and patient was having BMs and flatus.  This AM 08/08/18, patient has abdominal pain and nausea, NGT set back to suction and 964mL of dark brown/coffee ground output seen.  Surgery thinks conservative management will not suffice and may require surgery.  Patient has minimal PO intake for 6 days and Pharmacy consulted to manage TPN given expected prolonged NPO status.  Patient reports an intentional 20 lbs weight loss over 7 months (from diet and exercise) and he was eating normally until the day of admission.  GI: s/p OR 7/14 for LoA.  BL prealbumin low at 15.6, NG O/P 5110mL, LBM 7/13.  Bisacodyl, PRN Zofran  Endo: DM2 on insulin 70/30 PTA - CBGs uncontrolled Insulin requirements since 1800 yesterday: 59 units SSI + Lantus 5/d + 35 units regular insulin in TPN Lytes: K 3.2, others WNL (Na/Mag high normal) Renal: CKD3 - SCr back down to 2.68, BUN 51 - UOP 0.4 ml/kg/hr, net -4.7L Pulm: sleep apnea - stable on RA Cards: HTN - BP controlled, tachy 100s Hepatobil: LFTs / tbili / TG WNL Neuro: scheduled Robaxin, PRN Benadryl/morphine ID: afebrile, WBC up to 12.7 - not on abx Heme/Onc: colon cancer post hemicolectomy 1993, possible prostate cancer - hgb 7.7, plts WNL TPN Access: PICC placed 08/08/18 TPN start date: 08/08/18  Nutritional Goals (per RD rec 7/15): 2150-2350 kCal, 110-125gm protein, >/= 2L fluid per day  Current Nutrition:  TPN  Plan:  Continue TPN at 60 ml/hr (goal rate ~85 ml/hr) - not advancing d/t uncontrolled CBGs TPN provides 85g AA, 201g CHO and 45g ILE for a total of 1472 kCal,  meeting ~68% of patient's caloric needs and 77% protein needs Electrolytes in TPN: incr K, reduce Mag slightly, Cl:Ac 1:1 Daily multivitamin in TPN; trace elements MWF d/t national shortage Continue resistant SSI Q4H + increase regular insulin in TPN to 60 units + Lantus increased to 8 units daily per MD.  Monitor CBGs and plan to eventually d/c Lantus and use insulin in TPN only. Change IVF to LR 20K, continue at 100 ml/hr per MD F/U AM labs  Eevie Lapp D. Mina Marble, PharmD, BCPS, [place] 08/11/2018, 8:17 AM

## 2018-08-11 NOTE — Plan of Care (Signed)
  Problem: Clinical Measurements: Goal: Will remain free from infection Outcome: Progressing Goal: Diagnostic test results will improve Outcome: Progressing Goal: Cardiovascular complication will be avoided Outcome: Progressing   

## 2018-08-11 NOTE — Care Management (Signed)
PCP is Domenica Fail at [place] worker at the [place] is Kellie Simmering cell [phone] office [place]   Magdalen Spatz RN

## 2018-08-11 NOTE — Progress Notes (Signed)
PROGRESS NOTE  Daniel Odom HYQ:[id number] DOB: 1935/05/15 DOA: 08/03/2018 PCP: [facility]  Brief History   83 year old man PMH colon cancer status post right hemicolectomy presented with abdominal pain.  Admitted for small bowel obstruction.  Failed conservative management.  Underwent operative intervention 7/14.  A & P  Small bowel obstruction status post lysis of adhesions 7/14.  PMH colon cancer status post right hemicolectomy. --General surgery managing--episodic vomiting x2 overnight 7/16 NG tube replaced per surgery-we will follow along as per their plans --Continue TPN diet as per surgery/pharmacy-is a little hypokalemic today electrolytes to be managed via TPN as per my discussion with department with 7/7  Diabetes mellitus type 2-Home dose NPH 7 AM and 30 p.m. --Sugars elevated however greatly appreciate Pharm.D. assisting with TPN mixture  Acute kidney injury superimposed CKD stage III Prostate cancer + radiation + salvage cryoablation [place]-follows with Dr. Morrison Old --- last visit 07/08/2016 suggested microscopic prostate cancer Underlying imaging incontinence --Worsened likely perioperatively .Creatinine was 1.87 in February 07, 2017.    No Foley now Creatinine improving as below   Anemia, likely anemia from CKD. --Drop in hemoglobin perioperatively-monitor and transfusion trigger less than 7.0  Essential hypertension complicated by perioperative hypotension --Has been stable off medications  OSA --Continue CPAP each night as tolerated although likely will be unable to use given patient's NG tube in place  DVT prophylaxis: heparin, SCDs Code Status: Full Family Communication: Called patient's wife Daniel Odom at [phone] and updated Disposition Plan: home  Verneita Griffes, MD [facility] 10:00 AM  08/11/2018, 10:00 AM  LOS: 8 days   Consultants  . General surgery  Procedures  .   Antibiotics  .   Interval History/Subjective    Events with nausea and vomiting noted overnight NG tube put out over a liter overnight after NG tube was replaced He is not having much pain but understandably has not slept  Objective   Vitals:  Vitals:   08/10/18 2056 08/11/18 0416  BP: 139/69 127/76  Pulse: (!) 108 (!) 105  Resp: 15 16  Temp: 99.7 F (37.6 C) 99.3 F (37.4 C)  SpO2: 95% 97%    Exam:  Sleepy African-American male no distress however does not seem comfortable Abdomen covered with large bandage-I did not examine-General surgery however did examine today and states that the open wound is clean Chest is clinically clear in lateral lung fields External ocular movements are intact Slightly tachycardic S1-S2 sinus rhythm on auscultation No lower extremity edema Neurologically intact    I have personally reviewed the following:   Today's Data   BUNs/creatinine trending slowly downward 54/3.46-->56/3.1-->51/2.6 Potassium 3.2 Albumin is 2.7 Phosphorus 5.5 down to  Hemoglobin down from 9.8-8.4 currently 7.4--->7.7  Imaging  . CT abdomen pelvis review . Positive gas on plain film x-ray 7/17 personally reviewed Other Data  .   Scheduled Meds: . bisacodyl  10 mg Rectal Daily  . heparin injection (subcutaneous)  5,000 Units Subcutaneous Q8H  . insulin aspart  0-20 Units Subcutaneous Q4H  . insulin glargine  8 Units Subcutaneous Daily  . lip balm  1 application Topical BID   Continuous Infusions: . lactated ringers with kcl 100 mL/hr at 08/11/18 0850  . methocarbamol (ROBAXIN) IV 1,000 mg (08/11/18 0512)  . ondansetron (ZOFRAN) IV    . TPN ADULT (ION) 60 mL/hr at 08/11/18 0512  . TPN ADULT (ION)      Principal Problem:   SBO (small bowel obstruction) (HCC) Active Problems:  History of colon cancer   Diabetes mellitus type 2 in obese [facility])   Essential hypertension   CKD (chronic kidney disease) stage 3, GFR 30-59 ml/min (HCC)   Anemia associated with chronic renal failure   Hypertension    Diabetes mellitus without complication (HCC)   Mobitz type 1 second degree atrioventricular block   Morbid obesity with body mass index of 40.0-49.9 ([place])   LOS: 8 days   Verneita Griffes, MD [facility] 10:00 AM

## 2018-08-12 LAB — CBC WITH DIFFERENTIAL/PLATELET
Abs Immature Granulocytes: 0.14 10*3/uL — ABNORMAL HIGH (ref 0.00–0.07)
Basophils Absolute: 0 10*3/uL (ref 0.0–0.1)
Basophils Relative: 0 %
Eosinophils Absolute: 0.2 10*3/uL (ref 0.0–0.5)
Eosinophils Relative: 1 %
HCT: 20.1 % — ABNORMAL LOW (ref 39.0–52.0)
Hemoglobin: 7 g/dL — ABNORMAL LOW (ref 13.0–17.0)
Immature Granulocytes: 1 %
Lymphocytes Relative: 11 %
Lymphs Abs: 1.5 10*3/uL (ref 0.7–4.0)
MCH: 28.8 pg (ref 26.0–34.0)
MCHC: 34.8 g/dL (ref 30.0–36.0)
MCV: 82.7 fL (ref 80.0–100.0)
Monocytes Absolute: 0.9 10*3/uL (ref 0.1–1.0)
Monocytes Relative: 6 %
Neutro Abs: 11.1 10*3/uL — ABNORMAL HIGH (ref 1.7–7.7)
Neutrophils Relative %: 81 %
Platelets: 198 10*3/uL (ref 150–400)
RBC: 2.43 MIL/uL — ABNORMAL LOW (ref 4.22–5.81)
RDW: 14.2 % (ref 11.5–15.5)
WBC: 13.8 10*3/uL — ABNORMAL HIGH (ref 4.0–10.5)
nRBC: 0.1 % (ref 0.0–0.2)

## 2018-08-12 LAB — COMPREHENSIVE METABOLIC PANEL
ALT: 15 U/L (ref 0–44)
AST: 17 U/L (ref 15–41)
Albumin: 2.3 g/dL — ABNORMAL LOW (ref 3.5–5.0)
Alkaline Phosphatase: 51 U/L (ref 38–126)
Anion gap: 13 (ref 5–15)
BUN: 55 mg/dL — ABNORMAL HIGH (ref 8–23)
CO2: 36 mmol/L — ABNORMAL HIGH (ref 22–32)
Calcium: 8.6 mg/dL — ABNORMAL LOW (ref 8.9–10.3)
Chloride: 100 mmol/L (ref 98–111)
Creatinine, Ser: 2.84 mg/dL — ABNORMAL HIGH (ref 0.61–1.24)
GFR calc Af Amer: 23 mL/min — ABNORMAL LOW (ref >60)
GFR calc non Af Amer: 20 mL/min — ABNORMAL LOW (ref >60)
Glucose, Bld: 259 mg/dL — ABNORMAL HIGH (ref 70–99)
Potassium: 3 mmol/L — ABNORMAL LOW (ref 3.5–5.1)
Sodium: 149 mmol/L — ABNORMAL HIGH (ref 135–145)
Total Bilirubin: 0.6 mg/dL (ref 0.3–1.2)
Total Protein: 5.7 g/dL — ABNORMAL LOW (ref 6.5–8.1)

## 2018-08-12 LAB — PREPARE RBC (CROSSMATCH)

## 2018-08-12 LAB — GLUCOSE, CAPILLARY
Glucose-Capillary: 179 mg/dL — ABNORMAL HIGH (ref 70–99)
Glucose-Capillary: 193 mg/dL — ABNORMAL HIGH (ref 70–99)
Glucose-Capillary: 210 mg/dL — ABNORMAL HIGH (ref 70–99)
Glucose-Capillary: 219 mg/dL — ABNORMAL HIGH (ref 70–99)
Glucose-Capillary: 230 mg/dL — ABNORMAL HIGH (ref 70–99)
Glucose-Capillary: 238 mg/dL — ABNORMAL HIGH (ref 70–99)

## 2018-08-12 MED ORDER — TRAVASOL 10 % IV SOLN
INTRAVENOUS | Status: AC
  Administered 2018-08-12: 18:00:00 via INTRAVENOUS
  Filled 2018-08-12: qty 849.6

## 2018-08-12 MED ORDER — FUROSEMIDE 10 MG/ML IJ SOLN
20.0000 mg | Freq: Once | INTRAMUSCULAR | Status: AC
  Administered 2018-08-12: 20 mg via INTRAVENOUS
  Filled 2018-08-12: qty 2

## 2018-08-12 MED ORDER — POTASSIUM CHLORIDE IN NACL 20-0.45 MEQ/L-% IV SOLN
INTRAVENOUS | Status: DC
  Administered 2018-08-12 – 2018-08-13 (×4): via INTRAVENOUS
  Filled 2018-08-12 (×5): qty 1000

## 2018-08-12 MED ORDER — SODIUM CHLORIDE 0.9% IV SOLUTION
Freq: Once | INTRAVENOUS | Status: AC
  Administered 2018-08-12: 11:00:00 via INTRAVENOUS

## 2018-08-12 MED ORDER — ACETAMINOPHEN 325 MG PO TABS
650.0000 mg | ORAL_TABLET | Freq: Once | ORAL | Status: AC
  Administered 2018-08-12: 650 mg via ORAL
  Filled 2018-08-12: qty 2

## 2018-08-12 MED ORDER — POTASSIUM CHLORIDE 10 MEQ/50ML IV SOLN
10.0000 meq | INTRAVENOUS | Status: AC
  Administered 2018-08-12 (×3): 10 meq via INTRAVENOUS
  Filled 2018-08-12 (×3): qty 50

## 2018-08-12 NOTE — Progress Notes (Signed)
Patient ID: Daniel Odom, male   DOB: May 12, 1935, 83 y.o.   MRN: [id number] [facility] Progress Note:   4 Days Post-Op  Subjective: Mental status is fairly clear;  Passing a lot of flatus according to him Objective: Vital signs in last 24 hours: Temp:  [98.5 F (36.9 C)-99.5 F (37.5 C)] 98.5 F (36.9 C) (07/18 0424) Pulse Rate:  [100-104] 104 (07/18 0424) Resp:  [15-20] 15 (07/18 0424) BP: (108-117)/(61) 117/61 (07/18 0424) SpO2:  [91 %-96 %] 91 % (07/18 0424)  Intake/Output from previous day: 07/17 0701 - 07/18 0700 In: 1448 [P.O.:180; I.V.:1214; IV Piggyback:54] Out: 7125 [Urine:1675; Emesis/NG output:5450] Intake/Output this shift: Total I/O In: 60 [P.O.:60] Out: 300 [Emesis/NG output:300]  Physical Exam: Work of breathing is not labored.  Abdomen nontender  Lab Results:  Results for orders placed or performed during the hospital encounter of 08/03/18 (from the past 48 hour(s))  Glucose, capillary     Status: Abnormal   Collection Time: 08/10/18 12:26 PM  Result Value Ref Range   Glucose-Capillary 193 (H) 70 - 99 mg/dL  Glucose, capillary     Status: Abnormal   Collection Time: 08/10/18  4:45 PM  Result Value Ref Range   Glucose-Capillary 285 (H) 70 - 99 mg/dL  Glucose, capillary     Status: Abnormal   Collection Time: 08/10/18  8:21 PM  Result Value Ref Range   Glucose-Capillary 269 (H) 70 - 99 mg/dL  Glucose, capillary     Status: Abnormal   Collection Time: 08/10/18 11:55 PM  Result Value Ref Range   Glucose-Capillary 226 (H) 70 - 99 mg/dL  CBC with Differential/Platelet     Status: Abnormal   Collection Time: 08/11/18  4:07 AM  Result Value Ref Range   WBC 12.7 (H) 4.0 - 10.5 K/uL   RBC 2.71 (L) 4.22 - 5.81 MIL/uL   Hemoglobin 7.7 (L) 13.0 - 17.0 g/dL   HCT 21.9 (L) 39.0 - 52.0 %   MCV 80.8 80.0 - 100.0 fL   MCH 28.4 26.0 - 34.0 pg   MCHC 35.2 30.0 - 36.0 g/dL   RDW 14.3 11.5 - 15.5 %   Platelets 189 150 - 400 K/uL   nRBC 0.0 0.0 - 0.2 %    Neutrophils Relative % 85 %   Neutro Abs 10.8 (H) 1.7 - 7.7 K/uL   Lymphocytes Relative 7 %   Lymphs Abs 0.9 0.7 - 4.0 K/uL   Monocytes Relative 6 %   Monocytes Absolute 0.7 0.1 - 1.0 K/uL   Eosinophils Relative 0 %   Eosinophils Absolute 0.1 0.0 - 0.5 K/uL   Basophils Relative 0 %   Basophils Absolute 0.0 0.0 - 0.1 K/uL   Immature Granulocytes 2 %   Abs Immature Granulocytes 0.20 (H) 0.00 - 0.07 K/uL    Comment: Performed at [facility], [address] [place], [place] [address]  Comprehensive metabolic panel     Status: Abnormal   Collection Time: 08/11/18  4:07 AM  Result Value Ref Range   Sodium 143 135 - 145 mmol/L   Potassium 3.2 (L) 3.5 - 5.1 mmol/L   Chloride 102 98 - 111 mmol/L   CO2 28 22 - 32 mmol/L   Glucose, Bld 277 (H) 70 - 99 mg/dL   BUN 51 (H) 8 - 23 mg/dL   Creatinine, Ser 2.68 (H) 0.61 - 1.24 mg/dL   Calcium 8.6 (L) 8.9 - 10.3 mg/dL   Total Protein 6.1 (L) 6.5 - 8.1  g/dL   Albumin 2.7 (L) 3.5 - 5.0 g/dL   AST 18 15 - 41 U/L   ALT 17 0 - 44 U/L   Alkaline Phosphatase 57 38 - 126 U/L   Total Bilirubin 0.4 0.3 - 1.2 mg/dL   GFR calc non Af Amer 21 (L) >60 mL/min   GFR calc Af Amer 25 (L) >60 mL/min   Anion gap 13 5 - 15    Comment: Performed at [facility] [address] [place], [place] [address]  Magnesium     Status: None   Collection Time: 08/11/18  4:07 AM  Result Value Ref Range   Magnesium 2.3 1.7 - 2.4 mg/dL    Comment: Performed at [facility] [address] [place], [place] [address]  Phosphorus     Status: None   Collection Time: 08/11/18  4:07 AM  Result Value Ref Range   Phosphorus 3.3 2.5 - 4.6 mg/dL    Comment: Performed at [facility] [address], [place], [place] [address]  Glucose, capillary     Status: Abnormal   Collection Time: 08/11/18  4:13 AM  Result Value Ref Range   Glucose-Capillary 276 (H) 70 - 99 mg/dL  Glucose, capillary     Status: Abnormal   Collection Time: 08/11/18  8:02 AM  Result  Value Ref Range   Glucose-Capillary 284 (H) 70 - 99 mg/dL  Glucose, capillary     Status: Abnormal   Collection Time: 08/11/18 12:37 PM  Result Value Ref Range   Glucose-Capillary 239 (H) 70 - 99 mg/dL  Glucose, capillary     Status: Abnormal   Collection Time: 08/11/18  4:27 PM  Result Value Ref Range   Glucose-Capillary 224 (H) 70 - 99 mg/dL  Glucose, capillary     Status: Abnormal   Collection Time: 08/11/18  6:22 PM  Result Value Ref Range   Glucose-Capillary 198 (H) 70 - 99 mg/dL  Glucose, capillary     Status: Abnormal   Collection Time: 08/11/18  8:22 PM  Result Value Ref Range   Glucose-Capillary 221 (H) 70 - 99 mg/dL  Glucose, capillary     Status: Abnormal   Collection Time: 08/12/18 12:27 AM  Result Value Ref Range   Glucose-Capillary 193 (H) 70 - 99 mg/dL  CBC with Differential/Platelet     Status: Abnormal   Collection Time: 08/12/18  4:09 AM  Result Value Ref Range   WBC 13.8 (H) 4.0 - 10.5 K/uL   RBC 2.43 (L) 4.22 - 5.81 MIL/uL   Hemoglobin 7.0 (L) 13.0 - 17.0 g/dL   HCT 20.1 (L) 39.0 - 52.0 %   MCV 82.7 80.0 - 100.0 fL   MCH 28.8 26.0 - 34.0 pg   MCHC 34.8 30.0 - 36.0 g/dL   RDW 14.2 11.5 - 15.5 %   Platelets 198 150 - 400 K/uL   nRBC 0.1 0.0 - 0.2 %   Neutrophils Relative % 81 %   Neutro Abs 11.1 (H) 1.7 - 7.7 K/uL   Lymphocytes Relative 11 %   Lymphs Abs 1.5 0.7 - 4.0 K/uL   Monocytes Relative 6 %   Monocytes Absolute 0.9 0.1 - 1.0 K/uL   Eosinophils Relative 1 %   Eosinophils Absolute 0.2 0.0 - 0.5 K/uL   Basophils Relative 0 %   Basophils Absolute 0.0 0.0 - 0.1 K/uL   Immature Granulocytes 1 %   Abs Immature Granulocytes 0.14 (H) 0.00 - 0.07 K/uL  Target Cells PRESENT    Stomatocytes PRESENT     Comment: Performed at [facility], [place] [address] [place], [place] [address]  Comprehensive metabolic panel     Status: Abnormal   Collection Time: 08/12/18  4:09 AM  Result Value Ref Range   Sodium 149 (H) 135 - 145 mmol/L   Potassium 3.0 (L)  3.5 - 5.1 mmol/L   Chloride 100 98 - 111 mmol/L   CO2 36 (H) 22 - 32 mmol/L   Glucose, Bld 259 (H) 70 - 99 mg/dL   BUN 55 (H) 8 - 23 mg/dL   Creatinine, Ser 2.84 (H) 0.61 - 1.24 mg/dL   Calcium 8.6 (L) 8.9 - 10.3 mg/dL   Total Protein 5.7 (L) 6.5 - 8.1 g/dL   Albumin 2.3 (L) 3.5 - 5.0 g/dL   AST 17 15 - 41 U/L   ALT 15 0 - 44 U/L   Alkaline Phosphatase 51 38 - 126 U/L   Total Bilirubin 0.6 0.3 - 1.2 mg/dL   GFR calc non Af Amer 20 (L) >60 mL/min   GFR calc Af Amer 23 (L) >60 mL/min   Anion gap 13 5 - 15    Comment: Performed at [facility], [place] [address] [place],  [address]  Glucose, capillary     Status: Abnormal   Collection Time: 08/12/18  4:17 AM  Result Value Ref Range   Glucose-Capillary 238 (H) 70 - 99 mg/dL  Glucose, capillary     Status: Abnormal   Collection Time: 08/12/18  7:39 AM  Result Value Ref Range   Glucose-Capillary 219 (H) 70 - 99 mg/dL    Radiology/Results: Dg Abd 1 View  Result Date: 08/11/2018 CLINICAL DATA:  Status post gastric catheter placement EXAM: ABDOMEN - 1 VIEW COMPARISON:  Film from earlier in the same day. FINDINGS: Scattered large and small bowel gas with small-bowel dilatation is again seen. Gastric catheter is noted within the stomach. IMPRESSION: Gastric catheter in the stomach. Electronically Signed   By: Inez Catalina M.D.   On: 08/11/2018 10:15   Dg Abd Portable 1v  Result Date: 08/11/2018 CLINICAL DATA:  Abdominal distension EXAM: PORTABLE ABDOMEN - 1 VIEW COMPARISON:  08/08/2018 FINDINGS: Scattered large and small bowel gas is noted. The degree of small-bowel dilatation is relatively stable although increase in the degree of colonic gas is seen likely related to partial small bowel obstruction. No free air is seen. IMPRESSION: Stable small bowel dilatation although increase in the degree of colonic gas is noted likely related to partial small bowel obstruction. Electronically Signed   By: Inez Catalina M.D.   On: 08/11/2018  10:14    Anti-infectives: Anti-infectives (From admission, onward)   Start     Dose/Rate Route Frequency Ordered Stop   08/08/18 2200  gentamicin (GARAMYCIN) 180 mg in dextrose 5 % 50 mL IVPB     1.5 mg/kg  121.3 kg 109 mL/hr over 30 Minutes Intravenous  Once 08/08/18 1621 08/09/18 0119   08/08/18 2000  clindamycin (CLEOCIN) IVPB 600 mg     600 mg 100 mL/hr over 30 Minutes Intravenous  Once 08/08/18 1621 08/08/18 2020   08/08/18 0945  clindamycin (CLEOCIN) IVPB 600 mg     600 mg 100 mL/hr over 30 Minutes Intravenous On call to O.R. 08/08/18 0936 08/08/18 1410   08/08/18 0945  gentamicin (GARAMYCIN) 180 mg in dextrose 5 % 50 mL IVPB     1.5 mg/kg  121.3 kg 109 mL/hr  over 30 Minutes Intravenous On call to O.R. 08/08/18 0936 08/08/18 1445      Assessment/Plan: Problem List: Patient Active Problem List   Diagnosis Date Noted  . Mobitz type 1 second degree atrioventricular block 08/05/2018  . Morbid obesity with body mass index of 40.0-49.9 ([place]) 08/05/2018  . Hypertension   . Diabetes mellitus without complication ([place])   . SBO (small bowel obstruction) ([place]) 08/03/2018  . History of colon cancer 08/03/2018  . Diabetes mellitus type 2 in obese ([place]) 08/03/2018  . Essential hypertension 08/03/2018  . Anemia associated with chronic renal failure 08/03/2018  . CKD (chronic kidney disease) stage 3, GFR 30-59 ml/min (HCC) 02/07/2017  . OSA on CPAP 02/07/2017  . Tremor, hereditary, benign 02/07/2017  . Asthma 02/07/2017  . 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 Prostate cancer ([place]) 08/08/2016    Will discontinue NG and observe 4 Days Post-Op    LOS: 9 days   Matt B. Hassell Done, MD, [facility] [phone] beeper [phone]  08/12/2018 8:41 AM

## 2018-08-12 NOTE — Progress Notes (Addendum)
Spoke to patient's wife and gave an update. Unable to discontinue NGT since output has increased, MD aware.

## 2018-08-12 NOTE — Progress Notes (Signed)
PHARMACY - ADULT TOTAL PARENTERAL NUTRITION CONSULT NOTE   Pharmacy Consult:  TPN Indication:  SBO  Patient Measurements: Height: 5' 7.01" (170.2 cm) Weight: 259 lb 0.7 oz (117.5 kg) IBW/kg (Calculated) : 66.12 TPN AdjBW (KG): 79.9 Body mass index is 40.56 kg/m.  Assessment:  8 YOM presented on 08/03/18 with abdominal pain.  Found to have SBO and was on SB protocol.  NGT clamped on 08/06/18 and patient was having BMs and flatus.  This AM 08/08/18, patient has abdominal pain and nausea, NGT set back to suction and 928mL of dark brown/coffee ground output seen.  Surgery thinks conservative management will not suffice and may require surgery.  Patient has minimal PO intake for 6 days and Pharmacy consulted to manage TPN given expected prolonged NPO status.  Patient reports an intentional 20 lbs weight loss over 7 months (from diet and exercise) and he was eating normally until the day of admission.  GI: s/p OR 7/14 for LoA.  BL prealbumin low at 15.6, NG O/P 500 > 5441mL, LBM 7/13.  Bisacodyl, PRN Zofran/Phenergan Endo: DM2 on insulin 70/30 PTA - CBGs uncontrolled but improving Insulin requirements in past 24 hrs: 47 units SSI + Lantus 8/d + 60 units in TPN Lytes: Na 149, K 3 (KCL in IVF), CO2 elevated, others WNL Renal: CKD3 - SCr up 2.84, BUN 55 - UOP 0.6 ml/kg/hr, net -10.1L, LR 20K at 100/hr Pulm: sleep apnea - stable on RA Cards: HTN - BP controlled, tachy 100s Hepatobil: LFTs / tbili / TG WNL Neuro: scheduled Robaxin, PRN Benadryl/morphine ID: afebrile, WBC up to 13.8 - not on abx Heme/Onc: colon cancer post hemicolectomy 1993, possible prostate cancer - hgb 7, plts WNL TPN Access: PICC placed 08/08/18 TPN start date: 08/08/18  Nutritional Goals (per RD rec 7/15): 2150-2350 kCal, 110-125gm protein, >/= 2L fluid per day  Current Nutrition:  TPN  Plan:  Continue TPN at 60 ml/hr (goal rate ~85 ml/hr) - not advancing d/t uncontrolled CBGs TPN provides 85g AA, 201g CHO and 45g ILE  for a total of 1472 kCal, meeting ~68% of patient's caloric needs and 77% protein needs Electrolytes in TPN: reduce Na, incr K (108 mEq K/day - conservative given labile renal fxn and risk of accumulation), change Cl:Ac to 2:1 Daily multivitamin in TPN; trace elements MWF d/t national shortage Continue resistant SSI Q4H + increase regular insulin in TPN to 80 units + Lantus 8 units daily per MD.  Monitor CBGs and plan to eventually d/c Lantus and use insulin in TPN only if able. Change IVF to 1/2NS 20K at 150 ml/hr per discussion with MD to keep up with NG output (= 72 mEq K/day) KCL x 3 runs F/U AM labs, CBGs to advance TPN to goal  Chloe Baig D. Mina Marble, PharmD, BCPS, [place] 08/12/2018, 8:51 AM

## 2018-08-12 NOTE — Progress Notes (Signed)
PROGRESS NOTE  Daniel Odom VEL:[id number] DOB: 03-11-1935 DOA: 08/03/2018 PCP: [facility]  Brief History   83 year old man PMH colon cancer status post right hemicolectomy presented with abdominal pain.  Admitted for small bowel obstruction.  Failed conservative management.  Underwent operative intervention 7/14. Post-op ileus in addition to anemia  A & P  Small bowel obstruction status post lysis of adhesions 7/14.  PMH colon cancer status post right hemicolectomy. --General surgery managing--episodic vomiting x2 overnight 7/16 NG tube replaced per surgery-looks like we will attempt to take this 08/12/18 --Appreciate TPN pharmacist assisting with fluid management-becoming alkalotic secondary to loss succus/ H+ , change to half-normal saline  Diabetes mellitus type 2-Home dose NPH 7 AM and 30 p.m. --Sugars elevated however greatly appreciate Pharm.D. assisting with TPN mixture  Acute kidney injury superimposed CKD stage III Prostate cancer + radiation + salvage cryoablation [facility]-follows with Dr. Morrison Old --- last visit 07/08/2016 suggested microscopic prostate cancer Underlying imaging incontinence Mild hypokalemia probably related to GI losses ongoing --Worsened likely perioperatively but on the road to recovery-increasing IVF 150 cc/h for 24 hours and will adjust with the assistance of pharmacy .Creatinine was 1.87 in February 07, 2017.    No Foley now Creatinine stable at this time Potassium being replaced in TPN  Anemia, likely anemia from CKD. --Drop in hemoglobin perioperatively-transfusing 1 unit PRBC  Essential hypertension complicated by perioperative hypotension --Has been stable off medications  OSA --Continue CPAP each night as tolerated although likely will be unable to use given patient's NG tube in place  DVT prophylaxis: heparin, SCDs Code Status: Full Family Communication: Called patient's wife Bertram Millard Riedinger at [phone] on 7/17 Disposition  Plan: home  Verneita Griffes, MD [facility] 9:25 AM  08/12/2018, 9:25 AM  LOS: 9 days   Consultants  . General surgery  Procedures  .   Antibiotics  .   Interval History/Subjective   Large amounts of NG output-otherhwsie passing good flatus No cp No fever Rearing t go-ready to sit in chair  Objective   Vitals:  Vitals:   08/11/18 2026 08/12/18 0424  BP: 115/61 117/61  Pulse: (!) 102 (!) 104  Resp: 16 15  Temp: 99.5 F (37.5 C) 98.5 F (36.9 C)  SpO2: 95% 91%    Exam:  comfortable Abdomen covered with large bandage--slighyt distension Chest is clinically clear in lateral lung fields External ocular movements are intact Slightly tachycardic S1-S2 sinus rhythm on auscultation No lower extremity edema Neurologically intact    I have personally reviewed the following:   Today's Data  Na 149, k3,cor 36, bun/cr 55/2.8 Hemoglobin down from 9.8-8.4 currently 7.4--->7.7 -->7.0---trasnfusing Imaging  .  Other Data  .   Scheduled Meds: . sodium chloride   Intravenous Once  . acetaminophen  650 mg Oral Once  . bisacodyl  10 mg Rectal Daily  . furosemide  20 mg Intravenous Once  . heparin injection (subcutaneous)  5,000 Units Subcutaneous Q8H  . insulin aspart  0-20 Units Subcutaneous Q4H  . insulin glargine  8 Units Subcutaneous Daily  . lip balm  1 application Topical BID   Continuous Infusions: . 0.45 % NaCl with KCl 20 mEq / L    . methocarbamol (ROBAXIN) IV 1,000 mg (08/12/18 0453)  . ondansetron (ZOFRAN) IV 8 mg (08/11/18 1309)  . potassium chloride 10 mEq (08/12/18 0800)  . TPN ADULT (ION) 60 mL/hr at 08/11/18 1729  . TPN ADULT (ION)      Principal Problem:   SBO (  small bowel obstruction) (HCC) Active Problems:   History of colon cancer   Diabetes mellitus type 2 in obese [facility])   Essential hypertension   CKD (chronic kidney disease) stage 3, GFR 30-59 ml/min (HCC)   Anemia associated with chronic renal failure   Hypertension   Diabetes  mellitus without complication (HCC)   Mobitz type 1 second degree atrioventricular block   Morbid obesity with body mass index of 40.0-49.9 ([place])   LOS: 9 days   Verneita Griffes, MD [facility] 9:25 AM

## 2018-08-12 NOTE — Plan of Care (Signed)
  Problem: Pain Managment: Goal: General experience of comfort will improve Outcome: Progressing   Problem: Safety: Goal: Ability to remain free from injury will improve Outcome: Progressing   Problem: Skin Integrity: Goal: Risk for impaired skin integrity will decrease Outcome: Progressing   

## 2018-08-13 LAB — CBC
HCT: 24 % — ABNORMAL LOW (ref 39.0–52.0)
Hemoglobin: 8.3 g/dL — ABNORMAL LOW (ref 13.0–17.0)
MCH: 28.9 pg (ref 26.0–34.0)
MCHC: 34.6 g/dL (ref 30.0–36.0)
MCV: 83.6 fL (ref 80.0–100.0)
Platelets: 222 10*3/uL (ref 150–400)
RBC: 2.87 MIL/uL — ABNORMAL LOW (ref 4.22–5.81)
RDW: 14.8 % (ref 11.5–15.5)
WBC: 13.3 10*3/uL — ABNORMAL HIGH (ref 4.0–10.5)
nRBC: 0.2 % (ref 0.0–0.2)

## 2018-08-13 LAB — BPAM RBC
Blood Product Expiration Date: 202008172359
ISSUE DATE / TIME: 202007181043
Unit Type and Rh: 5100

## 2018-08-13 LAB — GLUCOSE, CAPILLARY
Glucose-Capillary: 161 mg/dL — ABNORMAL HIGH (ref 70–99)
Glucose-Capillary: 180 mg/dL — ABNORMAL HIGH (ref 70–99)
Glucose-Capillary: 197 mg/dL — ABNORMAL HIGH (ref 70–99)
Glucose-Capillary: 206 mg/dL — ABNORMAL HIGH (ref 70–99)
Glucose-Capillary: 209 mg/dL — ABNORMAL HIGH (ref 70–99)
Glucose-Capillary: 210 mg/dL — ABNORMAL HIGH (ref 70–99)

## 2018-08-13 LAB — BASIC METABOLIC PANEL
Anion gap: 13 (ref 5–15)
BUN: 56 mg/dL — ABNORMAL HIGH (ref 8–23)
CO2: 34 mmol/L — ABNORMAL HIGH (ref 22–32)
Calcium: 8.8 mg/dL — ABNORMAL LOW (ref 8.9–10.3)
Chloride: 103 mmol/L (ref 98–111)
Creatinine, Ser: 2.66 mg/dL — ABNORMAL HIGH (ref 0.61–1.24)
GFR calc Af Amer: 25 mL/min — ABNORMAL LOW (ref >60)
GFR calc non Af Amer: 22 mL/min — ABNORMAL LOW (ref >60)
Glucose, Bld: 210 mg/dL — ABNORMAL HIGH (ref 70–99)
Potassium: 3.5 mmol/L (ref 3.5–5.1)
Sodium: 150 mmol/L — ABNORMAL HIGH (ref 135–145)

## 2018-08-13 LAB — TYPE AND SCREEN
ABO/RH(D): O POS
Antibody Screen: NEGATIVE
Unit division: 0

## 2018-08-13 LAB — PHOSPHORUS: Phosphorus: 3.8 mg/dL (ref 2.5–4.6)

## 2018-08-13 LAB — MAGNESIUM: Magnesium: 2.3 mg/dL (ref 1.7–2.4)

## 2018-08-13 MED ORDER — TRAVASOL 10 % IV SOLN
INTRAVENOUS | Status: AC
  Administered 2018-08-13: 17:00:00 via INTRAVENOUS
  Filled 2018-08-13: qty 849.6

## 2018-08-13 MED ORDER — SODIUM CHLORIDE 0.45 % IV SOLN
INTRAVENOUS | Status: DC
  Administered 2018-08-13 – 2018-08-14 (×5): via INTRAVENOUS
  Filled 2018-08-13 (×6): qty 1000

## 2018-08-13 MED ORDER — INSULIN GLARGINE 100 UNIT/ML ~~LOC~~ SOLN
8.0000 [IU] | Freq: Every day | SUBCUTANEOUS | Status: DC
  Administered 2018-08-13 – 2018-08-14 (×2): 8 [IU] via SUBCUTANEOUS
  Filled 2018-08-13 (×2): qty 0.08

## 2018-08-13 NOTE — Progress Notes (Signed)
PHARMACY - ADULT TOTAL PARENTERAL NUTRITION CONSULT NOTE   Pharmacy Consult:  TPN Indication:  SBO  Patient Measurements: Height: 5' 7.01" (170.2 cm) Weight: 259 lb 14.8 oz (117.9 kg) IBW/kg (Calculated) : 66.12 TPN AdjBW (KG): 79.9 Body mass index is 40.7 kg/m.  Assessment:  41 YOM presented on 08/03/18 with abdominal pain.  Found to have SBO and was on SB protocol.  NGT clamped on 08/06/18 and patient was having BMs and flatus.  This AM 08/08/18, patient has abdominal pain and nausea, NGT set back to suction and 971mL of dark brown/coffee ground output seen.  Surgery thinks conservative management will not suffice and may require surgery.  Patient has minimal PO intake for 6 days and Pharmacy consulted to manage TPN given expected prolonged NPO status.  Patient reports an intentional 20 lbs weight loss over 7 months (from diet and exercise) and he was eating normally until the day of admission.  GI: s/p OR 7/14 for LoA.  BL prealbumin low at 15.6, NG O/P down to 179mL, LBM 7/13.  Bisacodyl, PRN Zofran/Phenergan Endo: DM2 on insulin 70/30 PTA - CBGs uncontrolled but improving Insulin requirements in past 24 hrs: down to 36 units SSI + off Lantus 8/d + 80 units in TPN Lytes: Na up to 150 (dehydration), K 3.5, CO2 down to 34, others WNL Renal: CKD3 - SCr down to 2.66, BUN 56 - UOP 0.8 ml/kg/hr, net -11.1L, 1/2NS 20K at 150/hr Pulm: sleep apnea - stable on RA Cards: HTN - VSS - Lasix x1 7/18 with transfusion Hepatobil: LFTs / tbili / TG WNL Neuro: scheduled Robaxin, PRN Benadryl/morphine ID: afebrile, WBC 13.8 - not on abx Heme/Onc: colon cancer post hemicolectomy 1993, possible prostate cancer - hgb 7, plts WNL TPN Access: PICC placed 08/08/18 TPN start date: 08/08/18  Nutritional Goals (per RD rec 7/15): 2150-2350 kCal, 110-125gm protein, >/= 2L fluid per day  Current Nutrition:  TPN  Plan:  Continue TPN at 60 ml/hr (goal rate ~85 ml/hr) - not advancing d/t uncontrolled CBGs TPN  provides 85g AA, 201g CHO and 45g ILE for a total of 1472 kCal, meeting ~68% of patient's caloric needs and 77% protein needs Electrolytes in TPN: remove Na, incr K (144 mEq K/day, max at this TPN rate), max CL Daily multivitamin in TPN; trace elements MWF d/t national shortage Continue resistant SSI Q4H + increase regular insulin in TPN to 93 units (max insulin at this TPN rate) + restart Lantus 8 units SQ daily. 1/2NS 10K at 200 ml/hr to keep up with GI losses per TRH (= 48 mEq K/day) F/U AM labs, CBGs to advance TPN to goal in AM  Corina Stacy D. Mina Marble, PharmD, BCPS, [place] 08/13/2018, 10:52 AM

## 2018-08-13 NOTE — Progress Notes (Signed)
5 Days Post-Op   Subjective/Chief Complaint: Reports having bm's yesterday and passing flatus today Still with high NG output   Objective: Vital signs in last 24 hours: Temp:  [98.2 F (36.8 C)-99.5 F (37.5 C)] 98.3 F (36.8 C) (07/19 0413) Pulse Rate:  [86-110] 97 (07/19 0413) Resp:  [12-20] 20 (07/19 0413) BP: (113-134)/(55-98) 133/60 (07/19 0413) SpO2:  [91 %-98 %] 96 % (07/19 0413) Weight:  [117.9 kg] 117.9 kg (07/19 0458) Last BM Date: 08/02/18  Intake/Output from previous day: 07/18 0701 - 07/19 0700 In: 3038.9 [P.O.:120; I.V.:2453.9; Blood:315; IV Piggyback:150] Out: 4000 [Urine:2300; Emesis/NG output:1700] Intake/Output this shift: No intake/output data recorded.  Exam: Awake and alert Abdomen obese, soft, open wound clean  Lab Results:  Recent Labs    08/11/18 0407 08/12/18 0409  WBC 12.7* 13.8*  HGB 7.7* 7.0*  HCT 21.9* 20.1*  PLT 189 198   BMET Recent Labs    08/12/18 0409 08/13/18 0310  NA 149* 150*  K 3.0* 3.5  CL 100 103  CO2 36* 34*  GLUCOSE 259* 210*  BUN 55* 56*  CREATININE 2.84* 2.66*  CALCIUM 8.6* 8.8*   PT/INR No results for input(s): LABPROT, INR in the last 72 hours. ABG No results for input(s): PHART, HCO3 in the last 72 hours.  Invalid input(s): PCO2, PO2  Studies/Results: No results found.  Anti-infectives: Anti-infectives (From admission, onward)   Start     Dose/Rate Route Frequency Ordered Stop   08/08/18 2200  gentamicin (GARAMYCIN) 180 mg in dextrose 5 % 50 mL IVPB     1.5 mg/kg  121.3 kg 109 mL/hr over 30 Minutes Intravenous  Once 08/08/18 1621 08/09/18 0119   08/08/18 2000  clindamycin (CLEOCIN) IVPB 600 mg     600 mg 100 mL/hr over 30 Minutes Intravenous  Once 08/08/18 1621 08/08/18 2020   08/08/18 0945  clindamycin (CLEOCIN) IVPB 600 mg     600 mg 100 mL/hr over 30 Minutes Intravenous On call to O.R. 08/08/18 0936 08/08/18 1410   08/08/18 0945  gentamicin (GARAMYCIN) 180 mg in dextrose 5 % 50 mL IVPB     1.5 mg/kg  121.3 kg 109 mL/hr over 30 Minutes Intravenous On call to O.R. 08/08/18 0936 08/08/18 1445      Assessment/Plan: s/p Procedure(s): LAPAROSCOPIC LYSIS OF ADHESIONS (N/A) Exploratory Laparotomy (N/A) Lysis of adhesions with repair of small intestine (N/A)  Creatinine up. CBC pending.  May need PRBCs today Continue NG until tomorrow to suction, then may clamp  LOS: 10 days    Daniel Odom 08/13/2018

## 2018-08-13 NOTE — Progress Notes (Signed)
PROGRESS NOTE  Daniel Odom GLO:[id number] DOB: 1935-10-21 DOA: 08/03/2018 [facility]: [facility]  Brief History   83 year old man PMH colon cancer status post right hemicolectomy presented with abdominal pain.  Admitted for small bowel obstruction.  Failed conservative management.  Underwent operative intervention 7/14. Post-op ileus in addition to anemia Will be here convalescing as still has High OP ostomy  A & P  Small bowel obstruction status post lysis of adhesions 7/14.  PMH colon cancer status post right hemicolectomy. --General surgery managing--episodic vomiting x2 overnight 7/16 NG tube replaced per surgery --Appreciate TPN pharmacist assisting with fluid management-  Diabetes mellitus type 2-Home dose NPH 7 AM and 30 p.m. --Sugars elevated however greatly appreciate Pharm.D. assisting with TPN mixture  Acute kidney injury superimposed CKD stage III Prostate cancer + radiation + salvage cryoablation [facility]-follows with Dr. Morrison Old --- last visit 07/08/2016 suggested microscopic prostate cancer Underlying imaging incontinence Mild hypokalemia probably related to GI losses ongoing --Worsened likely perioperatively but on the road to recovery-i .Creatinine was 1.87 in February 07, 2017.    hypernatremic 2/2 loss succus--->d/w TPN pharmacist and much appreciate collaboration ivf today 200 cc/h 1/2 NS +K 26meq Give runs of K x 3 Labs am +phos/Mag  Anemia, likely anemia from CKD Anemia expected Blood loss from surgery component hemodilution --transfused x 1 U PRBC 7/18, await labs rpt  Essential hypertension complicated by perioperative hypotension --Has been stable off medications  OSA --Continue CPAP each night as tolerated although likely will be unable to use given patient's NG tube in place  DVT prophylaxis: heparin, SCDs Code Status: Full Family Communication: Called patient's wife Daniel Odom at [phone] on 7/19 but no answer--will try in am  Disposition Plan: home  Verneita Griffes, MD [facility] 2:14 PM  08/13/2018, 2:14 PM  LOS: 10 days   Consultants  . General surgery  Procedures  .   Antibiotics  .   Interval History/Subjective   Still mod NG output ~ 1000 cc overnight Seems to be reducing slightly Comfy--pain manageable 2 stools with enema yesterday--no cp No fever  Objective   Vitals:  Vitals:   08/12/18 2007 08/13/18 0413  BP: (!) 113/59 133/60  Pulse: (!) 110 97  Resp: 20 20  Temp: 99.5 F (37.5 C) 98.3 F (36.8 C)  SpO2: 91% 96%    Exam:  comfortable Abdomen--distended wound not visualizedistension clinically clear in lateral lung fields External ocular movements are intact Slightly tachycardic-seems NSR No lower extremity edema Neurologically intact    I have personally reviewed the following:   Today's Data  Na 149-->150, k3-->3.5,cor 36-->34, bun/cr 55/2.8-->56/2.66 Hemoglobin down from 9.8-8.4 currently 7.4--->7.7 -->7.0---Labs pending Imaging  .  Other Data  .   Scheduled Meds: . bisacodyl  10 mg Rectal Daily  . heparin injection (subcutaneous)  5,000 Units Subcutaneous Q8H  . insulin aspart  0-20 Units Subcutaneous Q4H  . insulin glargine  8 Units Subcutaneous Daily  . lip balm  1 application Topical BID   Continuous Infusions: . methocarbamol (ROBAXIN) IV 1,000 mg (08/13/18 1308)  . ondansetron (ZOFRAN) IV 8 mg (08/11/18 1309)  . sodium chloride 0.45 % with kcl 200 mL/hr at 08/13/18 1204  . TPN ADULT (ION) 60 mL/hr at 08/12/18 1734  . TPN ADULT (ION)      Principal Problem:   SBO (small bowel obstruction) (HCC) Active Problems:   History of colon cancer   Diabetes mellitus type 2 in obese [facility])   Essential hypertension   CKD (  chronic kidney disease) stage 3, GFR 30-59 ml/min (HCC)   Anemia associated with chronic renal failure   Hypertension   Diabetes mellitus without complication (HCC)   Mobitz type 1 second degree atrioventricular block   Morbid  obesity with body mass index of 40.0-49.9 ([place])   LOS: 10 days   Verneita Griffes, MD [facility] 2:14 PM

## 2018-08-13 NOTE — Progress Notes (Signed)
Pt does not want CPAP tonight due to NG tube in place.

## 2018-08-14 ENCOUNTER — Inpatient Hospital Stay (HOSPITAL_COMMUNITY): Payer: No Typology Code available for payment source

## 2018-08-14 LAB — GLUCOSE, CAPILLARY
Glucose-Capillary: 113 mg/dL — ABNORMAL HIGH (ref 70–99)
Glucose-Capillary: 119 mg/dL — ABNORMAL HIGH (ref 70–99)
Glucose-Capillary: 133 mg/dL — ABNORMAL HIGH (ref 70–99)
Glucose-Capillary: 139 mg/dL — ABNORMAL HIGH (ref 70–99)
Glucose-Capillary: 143 mg/dL — ABNORMAL HIGH (ref 70–99)
Glucose-Capillary: 144 mg/dL — ABNORMAL HIGH (ref 70–99)
Glucose-Capillary: 164 mg/dL — ABNORMAL HIGH (ref 70–99)

## 2018-08-14 LAB — COMPREHENSIVE METABOLIC PANEL
ALT: 23 U/L (ref 0–44)
AST: 28 U/L (ref 15–41)
Albumin: 2.3 g/dL — ABNORMAL LOW (ref 3.5–5.0)
Alkaline Phosphatase: 87 U/L (ref 38–126)
Anion gap: 10 (ref 5–15)
BUN: 47 mg/dL — ABNORMAL HIGH (ref 8–23)
CO2: 30 mmol/L (ref 22–32)
Calcium: 8.8 mg/dL — ABNORMAL LOW (ref 8.9–10.3)
Chloride: 110 mmol/L (ref 98–111)
Creatinine, Ser: 2.27 mg/dL — ABNORMAL HIGH (ref 0.61–1.24)
GFR calc Af Amer: 30 mL/min — ABNORMAL LOW (ref >60)
GFR calc non Af Amer: 26 mL/min — ABNORMAL LOW (ref >60)
Glucose, Bld: 143 mg/dL — ABNORMAL HIGH (ref 70–99)
Potassium: 3.7 mmol/L (ref 3.5–5.1)
Sodium: 150 mmol/L — ABNORMAL HIGH (ref 135–145)
Total Bilirubin: 0.5 mg/dL (ref 0.3–1.2)
Total Protein: 6.1 g/dL — ABNORMAL LOW (ref 6.5–8.1)

## 2018-08-14 LAB — DIFFERENTIAL
Abs Immature Granulocytes: 0.32 10*3/uL — ABNORMAL HIGH (ref 0.00–0.07)
Basophils Absolute: 0 10*3/uL (ref 0.0–0.1)
Basophils Relative: 0 %
Eosinophils Absolute: 0.2 10*3/uL (ref 0.0–0.5)
Eosinophils Relative: 2 %
Immature Granulocytes: 2 %
Lymphocytes Relative: 14 %
Lymphs Abs: 1.8 10*3/uL (ref 0.7–4.0)
Monocytes Absolute: 0.8 10*3/uL (ref 0.1–1.0)
Monocytes Relative: 6 %
Neutro Abs: 10.4 10*3/uL — ABNORMAL HIGH (ref 1.7–7.7)
Neutrophils Relative %: 76 %

## 2018-08-14 LAB — CBC
HCT: 24.5 % — ABNORMAL LOW (ref 39.0–52.0)
Hemoglobin: 8.3 g/dL — ABNORMAL LOW (ref 13.0–17.0)
MCH: 28.5 pg (ref 26.0–34.0)
MCHC: 33.9 g/dL (ref 30.0–36.0)
MCV: 84.2 fL (ref 80.0–100.0)
Platelets: 266 10*3/uL (ref 150–400)
RBC: 2.91 MIL/uL — ABNORMAL LOW (ref 4.22–5.81)
RDW: 14.8 % (ref 11.5–15.5)
WBC: 13.5 10*3/uL — ABNORMAL HIGH (ref 4.0–10.5)
nRBC: 0.3 % — ABNORMAL HIGH (ref 0.0–0.2)

## 2018-08-14 LAB — TRIGLYCERIDES: Triglycerides: 258 mg/dL — ABNORMAL HIGH (ref <150)

## 2018-08-14 LAB — MAGNESIUM: Magnesium: 1.9 mg/dL (ref 1.7–2.4)

## 2018-08-14 LAB — PHOSPHORUS: Phosphorus: 3.8 mg/dL (ref 2.5–4.6)

## 2018-08-14 LAB — PREALBUMIN: Prealbumin: 14.6 mg/dL — ABNORMAL LOW (ref 18–38)

## 2018-08-14 MED ORDER — SODIUM CHLORIDE 0.45 % IV SOLN
INTRAVENOUS | Status: DC
  Administered 2018-08-14 – 2018-08-16 (×6): via INTRAVENOUS

## 2018-08-14 MED ORDER — TRAVASOL 10 % IV SOLN
INTRAVENOUS | Status: AC
  Administered 2018-08-14: 18:00:00 via INTRAVENOUS
  Filled 2018-08-14: qty 1203.6

## 2018-08-14 NOTE — Progress Notes (Signed)
Patient unable to wear CPAP HS due to NG tube placed.

## 2018-08-14 NOTE — Progress Notes (Signed)
[facility]/Trauma Progress Note  6 Days Post-Op   Assessment/Plan history of colon cancer status post right hemicolectomy history of prostate cancer diabetes mellitus type 2 Hypertension chronic kidney disease stage III  SBO -S/P LOA, enterorrhaphy, Dr. Kieth Brightly, 07/14 -pt tolerated NGT clamping and sips, pulled NGT 07/16 and allowed FLD - vomiting overnight, NPO, AXR pending, NGT reinsertion 07/17 since then output has been high - pt having flatus and BM's, will try clamping again today to see how he tolerates - continue wet to dry dressing changes - spoke to daughter, Derek [facility] in [place]) today   FEN:NPO,NGT clamped with sips, TPNuntil pt is tolerating a soft diet VTE: SCD's,heparin YH:CWCB Foley:Dc foley07/15 Follow up:Dr. Kinsinger   LOS: 11 days    Subjective: CC: thirsty  Pt states flatus and BM's yesterday. None yet today. No abdominal pain.   Objective: Vital signs in last 24 hours: Temp:  [98.6 F (37 C)-100.5 F (38.1 C)] 99 F (37.2 C) (07/20 0456) Pulse Rate:  [90-101] 96 (07/20 0456) Resp:  [14-20] 18 (07/20 0456) BP: (136-159)/(65-98) 148/68 (07/20 0456) SpO2:  [95 %-97 %] 95 % (07/20 0456) Last BM Date: 08/12/18  Intake/Output from previous day: 07/19 0701 - 07/20 0700 In: 4236.3 [I.V.:3136.3; NG/GT:1000; IV Piggyback:100] Out: 5150 [Urine:3250; Emesis/NG output:1900] Intake/Output this shift: Total I/O In: 120 [P.O.:120] Out: 600 [Emesis/NG output:600]  PE: Gen: Alert, pleasant, cooperative Pulm:Rate andeffort normal Abd: Soft,obese,no distention,few BS appreciated, incision C/D/I,no TTP, no peritonitis Skin: no rashes noted, warm and dry  Anti-infectives: Anti-infectives (From admission, onward)   Start     Dose/Rate Route Frequency Ordered Stop   08/08/18 2200  gentamicin (GARAMYCIN) 180 mg in dextrose 5 % 50 mL IVPB     1.5 mg/kg  121.3 kg 109 mL/hr over 30 Minutes Intravenous  Once  08/08/18 1621 08/09/18 0119   08/08/18 2000  clindamycin (CLEOCIN) IVPB 600 mg     600 mg 100 mL/hr over 30 Minutes Intravenous  Once 08/08/18 1621 08/08/18 2020   08/08/18 0945  clindamycin (CLEOCIN) IVPB 600 mg     600 mg 100 mL/hr over 30 Minutes Intravenous On call to O.R. 08/08/18 0936 08/08/18 1410   08/08/18 0945  gentamicin (GARAMYCIN) 180 mg in dextrose 5 % 50 mL IVPB     1.5 mg/kg  121.3 kg 109 mL/hr over 30 Minutes Intravenous On call to O.R. 08/08/18 0936 08/08/18 1445      Lab Results:  Recent Labs    08/13/18 1312 08/14/18 0332  WBC 13.3* 13.5*  HGB 8.3* 8.3*  HCT 24.0* 24.5*  PLT 222 266   BMET Recent Labs    08/13/18 0310 08/14/18 0332  NA 150* 150*  K 3.5 3.7  CL 103 110  CO2 34* 30  GLUCOSE 210* 143*  BUN 56* 47*  CREATININE 2.66* 2.27*  CALCIUM 8.8* 8.8*   PT/INR No results for input(s): LABPROT, INR in the last 72 hours. CMP     Component Value Date/Time   NA 150 (H) 08/14/2018 0332   K 3.7 08/14/2018 0332   CL 110 08/14/2018 0332   CO2 30 08/14/2018 0332   GLUCOSE 143 (H) 08/14/2018 0332   BUN 47 (H) 08/14/2018 0332   CREATININE 2.27 (H) 08/14/2018 0332   CALCIUM 8.8 (L) 08/14/2018 0332   PROT 6.1 (L) 08/14/2018 0332   ALBUMIN 2.3 (L) 08/14/2018 0332   AST 28 08/14/2018 0332   ALT 23 08/14/2018 0332   ALKPHOS 87 08/14/2018 0332  BILITOT 0.5 08/14/2018 0332   GFRNONAA 26 (L) 08/14/2018 0332   GFRAA 30 (L) 08/14/2018 0332   Lipase     Component Value Date/Time   LIPASE 31 08/03/2018 1445    Studies/Results: No results found.    Kalman Drape , [facility] 08/14/2018, 10:42 AM  Pager: [phone] Mon-Wed, Friday 7:00am-4:30pm Thurs 7am-11:30am  Consults: [phone]

## 2018-08-14 NOTE — Plan of Care (Signed)
  Problem: Education: Goal: Knowledge of General Education information will improve Description Including pain rating scale, medication(s)/side effects and non-pharmacologic comfort measures Outcome: Progressing   

## 2018-08-14 NOTE — Plan of Care (Signed)
  Problem: Health Behavior/Discharge Planning: Goal: Ability to manage health-related needs will improve Outcome: Progressing   

## 2018-08-14 NOTE — Progress Notes (Signed)
PHARMACY - ADULT TOTAL PARENTERAL NUTRITION CONSULT NOTE   Pharmacy Consult:  TPN Indication:  SBO  Patient Measurements: Height: 5' 7.01" (170.2 cm) Weight: 259 lb 14.8 oz (117.9 kg) IBW/kg (Calculated) : 66.12 TPN AdjBW (KG): 79.9 Body mass index is 40.7 kg/m.  Assessment:  89 YOM presented on 08/03/18 with abdominal pain.  Found to have SBO and was on SB protocol.  NGT clamped on 08/06/18 and patient was having BMs and flatus.  This AM 08/08/18, patient has abdominal pain and nausea, NGT set back to suction and 970mL of dark brown/coffee ground output seen.  Surgery thinks conservative management will not suffice and may require surgery.  Patient has minimal PO intake for 6 days and Pharmacy consulted to manage TPN given expected prolonged NPO status.  Patient reports an intentional 20 lbs weight loss over 7 months (from diet and exercise) and he was eating normally until the day of admission.  GI: s/p OR 7/14 for LoA. Prealbumin 15.6>14.6, NG O/P down to 1480mL, LBM 7/18. Bisacodyl. PRN Zofran/Phenergan Endo: DM2 on insulin 70/30 PTA - CBGs now controlled 133-161 Insulin requirements in past 24 hrs: down to 25 units SSI + Lantus 8/d + 93 units in TPN Lytes: Na elevated - stable at 150 (dehydration), K up to 3.7, CO2 normalized - down to 30, others WNL Renal: CKD3 - SCr down to 2.27, BUN 47 (down) - UOP 1.1 ml/kg/hr, net -12.3L, 1/2NS 10K at 200/hr Pulm: sleep apnea - stable on RA Cards: HTN - VSS - Lasix x1 7/18 with transfusion Hepatobil: LFTs / tbili WNL, TG 127>258 Neuro: scheduled Robaxin, PRN Benadryl/morphine ID: Tmax/24h 100.5, WBC 13.5 stable - not on abx Heme/Onc: colon cancer post hemicolectomy 1993, possible prostate cancer - Hg low but stable 8.3, plt WNL TPN Access: PICC placed 08/08/18 TPN start date: 08/08/18  Nutritional Goals (per RD rec 7/15): 2150-2350 kCal, 110-125gm protein, >/= 2L fluid per day  Current Nutrition:  TPN  Plan:  Increase TPN to goal rate 85  ml/hr TPN provides 120g AA, 286g CHO and 63g ILE for a total of 2085 kCal, meeting ~97% of patient's caloric needs and 100% protein needs Electrolytes in TPN: remove Na, keep K at same total 143 mEq K/day with rate increase (per discussion with TRH); max Cl Daily multivitamin in TPN; trace elements MWF d/t national shortage Continue resistant SSI Q4H + increase regular insulin in TPN to 120 units (with increased dextrose content in TPN) + d/c Lantus 8 units SQ daily (per discussion with Dr. Verlon Au, already received dose today) Continue 1/2NS at 200 ml/hr to keep up with GI losses per TRH,  but remove 8meq K from bag Monitor AM labs, Surgery plans  Elicia Lamp, PharmD, BCPS Please check AMION for all [place] contact numbers Clinical Pharmacist 08/14/2018 9:02 AM

## 2018-08-14 NOTE — Progress Notes (Signed)
PROGRESS NOTE  Daniel Odom ION:[id number] DOB: 24-Mar-1935 DOA: 08/03/2018 [facility]: [facility]  Brief History   83 year old man PMH colon cancer status post right hemicolectomy presented with abdominal pain.  Admitted for small bowel obstruction.  Failed conservative management.  Underwent operative intervention 7/14. Post-op ileus in addition to anemia Will be here convalescing as still has High OP ostomy  A & P  Small bowel obstruction status post lysis of adhesions 7/14.  PMH colon cancer status post right hemicolectomy. --General surgery managing--episodic vomiting x2 overnight 7/16 NG tube replaced per surgery --TPN for forseeable future  Diabetes mellitus type 2-Home dose NPH 7 AM and 30 p.m. --Sugars elevated however greatly appreciate Pharm.D. assisting with TPN mixture --Stop Lantus today  Acute kidney injury superimposed CKD stage III Prostate cancer + radiation + salvage cryoablation [facility]-follows with Dr. Morrison Old --- last visit 07/08/2016 suggested microscopic prostate cancer Underlying imaging incontinence Mild hypokalemia probably related to GI losses ongoing --Worsened likely perioperatively but on the road to recovery-i .Creatinine was 1.87 in February 07, 2017.    hypernatremic 2/2 loss succus--->d/w TPN pharmacist and much appreciate collaboration ivf today 200 cc/h 1/2 NS--stop K in peripheral fluids Labs am +phos/Mag  Anemia, likely anemia from CKD Anemia expected Blood loss from surgery component hemodilution --transfused x 1 U PRBC 7/18, await labs rpt  Essential hypertension complicated by perioperative hypotension --Has been stable off medications  OSA --Continue CPAP each night as tolerated although likely will be unable to use given patient's NG tube in place  DVT prophylaxis: heparin, SCDs Code Status: Full Family Communication: no contact today--appreciate surgery calling RE: post op care Disposition Plan: home  Verneita Griffes, MD  [facility] 11:37 AM  08/14/2018, 11:37 AM  LOS: 11 days   Consultants  . General surgery  Procedures  .   Antibiotics  .   Interval History/Subjective   Still mod NG output ~ 1000 cc overnight Seems to be reducing slightly Comfy--pain manageable 2 stools with enema yesterday--no cp No fever  Objective   Vitals:  Vitals:   08/14/18 0059 08/14/18 0456  BP: 136/76 (!) 148/68  Pulse: (!) 101 96  Resp: 18 18  Temp: 98.6 F (37 C) 99 F (37.2 C)  SpO2: 96% 95%    Exam:  comfortable Abdomen--distended wound not visualize distension clinically clear in lateral lung fields External ocular movements are intact Slightly tachycardic-seems NSR No lower extremity edema Neurologically intact    I have personally reviewed the following:   Today's Data  Na 149-->150, k3-->3.5,cor 36-->34, bun/cr 55/2.8-->56/2.66--->47/2.2 Hemoglobin down from 9.8-8.4 currently 7.4--->7.7 -->7.0---8.3 Imaging  .  Other Data  .   Scheduled Meds: . bisacodyl  10 mg Rectal Daily  . heparin injection (subcutaneous)  5,000 Units Subcutaneous Q8H  . insulin aspart  0-20 Units Subcutaneous Q4H  . lip balm  1 application Topical BID   Continuous Infusions: . sodium chloride    . methocarbamol (ROBAXIN) IV 1,000 mg (08/14/18 0539)  . ondansetron (ZOFRAN) IV 8 mg (08/11/18 1309)  . TPN ADULT (ION) 60 mL/hr at 08/13/18 1728  . TPN ADULT (ION)      Principal Problem:   SBO (small bowel obstruction) (HCC) Active Problems:   History of colon cancer   Diabetes mellitus type 2 in obese [facility])   Essential hypertension   CKD (chronic kidney disease) stage 3, GFR 30-59 ml/min (HCC)   Anemia associated with chronic renal failure   Hypertension   Diabetes mellitus without complication (  [place])   Mobitz type 1 second degree atrioventricular block   Morbid obesity with body mass index of 40.0-49.9 ([place])   LOS: 11 days   Verneita Griffes, MD [facility] 11:37 AM

## 2018-08-14 NOTE — Progress Notes (Signed)
   08/14/18 1400  Clinical Encounter Type  Visited With Patient;Health care provider  Visit Type Initial  Referral From Nurse  Spiritual Encounters  Spiritual Needs Emotional;Grief support;Prayer  Stress Factors  Patient Stress Factors Major life changes   Met w/ pt at rec of [facility].  Pt was very grateful for visit.  Notes that he wears hearing aids, but that he isn't wearing them at present b/c they are not working properly.  Pt was excited to share pictures of his family, esp 2 grandkids.  Also showed pics of his brothers, including Hemi Chacko, his brother 9 years his senior who died shortly before pt came to hospital for surgery.  Pt prayed w/ chaplain, welcomes repeat visit.  Myra Gianotti resident, [phone]

## 2018-08-15 LAB — CBC WITH DIFFERENTIAL/PLATELET
Abs Immature Granulocytes: 0.24 10*3/uL — ABNORMAL HIGH (ref 0.00–0.07)
Basophils Absolute: 0 10*3/uL (ref 0.0–0.1)
Basophils Relative: 0 %
Eosinophils Absolute: 0.2 10*3/uL (ref 0.0–0.5)
Eosinophils Relative: 1 %
HCT: 23 % — ABNORMAL LOW (ref 39.0–52.0)
Hemoglobin: 7.9 g/dL — ABNORMAL LOW (ref 13.0–17.0)
Immature Granulocytes: 2 %
Lymphocytes Relative: 15 %
Lymphs Abs: 2 10*3/uL (ref 0.7–4.0)
MCH: 28.7 pg (ref 26.0–34.0)
MCHC: 34.3 g/dL (ref 30.0–36.0)
MCV: 83.6 fL (ref 80.0–100.0)
Monocytes Absolute: 0.8 10*3/uL (ref 0.1–1.0)
Monocytes Relative: 6 %
Neutro Abs: 10.3 10*3/uL — ABNORMAL HIGH (ref 1.7–7.7)
Neutrophils Relative %: 76 %
Platelets: 299 10*3/uL (ref 150–400)
RBC: 2.75 MIL/uL — ABNORMAL LOW (ref 4.22–5.81)
RDW: 15.3 % (ref 11.5–15.5)
WBC: 13.5 10*3/uL — ABNORMAL HIGH (ref 4.0–10.5)
nRBC: 0.1 % (ref 0.0–0.2)

## 2018-08-15 LAB — GLUCOSE, CAPILLARY
Glucose-Capillary: 116 mg/dL — ABNORMAL HIGH (ref 70–99)
Glucose-Capillary: 141 mg/dL — ABNORMAL HIGH (ref 70–99)
Glucose-Capillary: 142 mg/dL — ABNORMAL HIGH (ref 70–99)
Glucose-Capillary: 149 mg/dL — ABNORMAL HIGH (ref 70–99)
Glucose-Capillary: 165 mg/dL — ABNORMAL HIGH (ref 70–99)
Glucose-Capillary: 187 mg/dL — ABNORMAL HIGH (ref 70–99)

## 2018-08-15 LAB — COMPREHENSIVE METABOLIC PANEL
ALT: 27 U/L (ref 0–44)
AST: 29 U/L (ref 15–41)
Albumin: 2.3 g/dL — ABNORMAL LOW (ref 3.5–5.0)
Alkaline Phosphatase: 79 U/L (ref 38–126)
Anion gap: 10 (ref 5–15)
BUN: 46 mg/dL — ABNORMAL HIGH (ref 8–23)
CO2: 23 mmol/L (ref 22–32)
Calcium: 8.3 mg/dL — ABNORMAL LOW (ref 8.9–10.3)
Chloride: 110 mmol/L (ref 98–111)
Creatinine, Ser: 2.2 mg/dL — ABNORMAL HIGH (ref 0.61–1.24)
GFR calc Af Amer: 31 mL/min — ABNORMAL LOW (ref >60)
GFR calc non Af Amer: 27 mL/min — ABNORMAL LOW (ref >60)
Glucose, Bld: 133 mg/dL — ABNORMAL HIGH (ref 70–99)
Potassium: 3.9 mmol/L (ref 3.5–5.1)
Sodium: 143 mmol/L (ref 135–145)
Total Bilirubin: 0.5 mg/dL (ref 0.3–1.2)
Total Protein: 6 g/dL — ABNORMAL LOW (ref 6.5–8.1)

## 2018-08-15 LAB — MAGNESIUM: Magnesium: 1.9 mg/dL (ref 1.7–2.4)

## 2018-08-15 LAB — PHOSPHORUS: Phosphorus: 4 mg/dL (ref 2.5–4.6)

## 2018-08-15 MED ORDER — TRAVASOL 10 % IV SOLN
INTRAVENOUS | Status: AC
  Administered 2018-08-15: 18:00:00 via INTRAVENOUS
  Filled 2018-08-15: qty 1203.6

## 2018-08-15 NOTE — Progress Notes (Signed)
Nutrition Follow-up  RD working remotely.  DOCUMENTATION CODES:   Morbid obesity  INTERVENTION:   -TPN management per pharmacy -RD will follow for diet advancement and supplement diet as appropriate  NUTRITION DIAGNOSIS:   Inadequate oral intake related to altered GI function as evidenced by NPO status.  Ongoing  GOAL:   Patient will meet greater than or equal to 90% of their needs  Met with TPN  MONITOR:   Labs, I & O's, Weight trends, Skin, Diet advancement  REASON FOR ASSESSMENT:   Consult New TPN/TNA  ASSESSMENT:   83 year old male who presented to the ED on 7/09 with abdominal pain. PMH of HTN, T2DM, colon cancer s/p right hemicolectomy, prostate cancer, CKD stage III, anemia. CT abdomen showing small bowel obstruction. NG tube placed.  7/12 - NG tube clamping trial, sips of clears 7/14 - NG tube back to suction with 900 ml dark brown/coffee ground output, s/p LOA and enterorrhaphy, TPN initiated 7/16- NGT inserted, removed 7/17- NGT replaced due to vomiting  Reviewed I/O's: +464 ml x 24hours and -11.9 L since admission  UOP: 2.2 L x 24 hours  NGT output: 600 ml x 24 hours  NGT clamped yesterday and pt tolerated; he is also passing flatus and BMs. Per general surgery notes, if NGT output is low, will d/clater today and advance diet.   Pt remains NPO, receiving TPN @ 85 ml/hr, which provides 2085 kcals and 120 grams protein, which meets 97% of estimated kcal needs and 100% of estimated protein needs.   IVFS: 0.45% sodium chloride @ 200 ml/hr  Labs reviewed: K, Mg, Phos WDL. CBGS: 116-144 (inpatient orders for glycemic control are 0-20 units insulin aspart every 4 hours and 120 units regular insulin included in TPN).   Diet Order:   Diet Order            Diet NPO time specified Except for: Ice Chips, Sips with Meds, Other (See Comments)  Diet effective now              EDUCATION NEEDS:   No education needs have been identified at this  time  Skin:  Skin Assessment: Skin Integrity Issues: Skin Integrity Issues:: Incisions Incisions: closed incision to abdomen  Last BM:  08/13/18  Height:   Ht Readings from Last 1 Encounters:  08/08/18 5' 7.01" (1.702 m)    Weight:   Wt Readings from Last 1 Encounters:  08/15/18 117.8 kg    Ideal Body Weight:  67.3 kg  BMI:  Body mass index is 40.67 kg/m.  Estimated Nutritional Needs:   Kcal:  2150-2350  Protein:  110-125 grams  Fluid:  >/= 2.0 L    Shantell Belongia A. Jimmye Norman, RD, LDN, [place] Registered Dietitian II Certified Diabetes Care and Education Specialist Pager: [phone] After hours Pager: [phone]

## 2018-08-15 NOTE — Plan of Care (Signed)
  Problem: Activity: Goal: Risk for activity intolerance will decrease Outcome: Progressing   

## 2018-08-15 NOTE — Progress Notes (Signed)
PROGRESS NOTE  Daniel Odom EAV:[id number] DOB: [birth year] DOA: 08/03/2018 PCP: [facility]  Brief History   83 year old man PMH colon cancer status post right hemicolectomy presented with abdominal pain.  Admitted for small bowel obstruction.  Failed conservative management.  Underwent operative intervention 7/14. Post-op ileus in addition to anemia Will be here convalescing as still has High OP ostomy  A & P  Small bowel obstruction status post lysis of adhesions 7/14.  PMH colon cancer status post right hemicolectomy. --General surgery managing--episodic vomiting x2 overnight 7/16 NG tube replaced per surgery --TPN for forseeable future-graduate diet and remove tube as per surgeon  Diabetes mellitus type 2-Home dose NPH 7 AM and 30 p.m. --Sugars elevated however greatly appreciate Pharm.D. assisting with TPN mixture --Glycemia managed via TPN mixture with insulin  Acute kidney injury superimposed CKD stage III Prostate cancer + radiation + salvage cryoablation [place]-follows with Dr. Morrison Old --- last visit 07/08/2016 suggested microscopic prostate cancer Underlying imaging incontinence Mild hypokalemia probably related to GI losses ongoing --Worsened likely perioperatively but on the road to recovery-i .Creatinine was 1.87 in February 07, 2017.    hypernatremic 2/2 loss succus--->d/w TPN pharmacist-thank you ivf today 200 cc/h 1/2 NS--stop K in peripheral fluids TPN regimen unchanged today 7/21 creatinine is much better and is trending downward in the right direction and we will see how the patient does over the course of time with diet  Anemia, likely anemia from CKD Anemia expected Blood loss from surgery component hemodilution --transfused x 1 U PRBC 7/18 Labs stabilizing in the 7-8 range transfusion threshold is below 7   Essential hypertension complicated by perioperative hypotension --Has been stable off medications  OSA --Continue CPAP each night  as tolerated although likely will be unable to use given patient's NG tube in place  DVT prophylaxis: heparin, SCDs Code Status: Full Family Communication: [name] updated fully 7/21  Disposition Plan: home  Verneita Griffes, MD [facility] 8:45 AM  08/15/2018, 8:45 AM  LOS: 12 days   Consultants  . General surgery  Procedures  .   Antibiotics  .   Interval History/Subjective   NG output seems to be stabilizing and has decreased for the first time in several days today He looks comfortable Passing flatus and stool Clamping NG per surgery   Objective   Vitals:  Vitals:   08/14/18 2359 08/15/18 0410  BP: 131/72 121/76  Pulse: 97 94  Resp: 17 17  Temp: 98 F (36.7 C) 98.9 F (37.2 C)  SpO2: 98% 100%    Exam:  comfortable sitting in bed Abdomen--distended slight tender epigastrium clinically clear in lateral lung fields External ocular movements are intact Slightly tachycardic-seems NSR No lower extremity edema Neurologically intact    I have personally reviewed the following:   Today's Data  Na 149-->150,   k3-->3.5,CO2r 36-->34--->23  bun/cr 55/2.8-->56/2.66--->47/2.2-->46/2.2  Hemoglobin down from 9.8-8.4 currently 7.4--->7.7 -->7.0---8.3-->7.9 today  Imaging  .  Other Data  .   Scheduled Meds: . bisacodyl  10 mg Rectal Daily  . heparin injection (subcutaneous)  5,000 Units Subcutaneous Q8H  . insulin aspart  0-20 Units Subcutaneous Q4H  . lip balm  1 application Topical BID   Continuous Infusions: . sodium chloride 200 mL/hr at 08/15/18 0500  . methocarbamol (ROBAXIN) IV 1,000 mg (08/15/18 0522)  . ondansetron (ZOFRAN) IV 8 mg (08/11/18 1309)  . TPN ADULT (ION) 85 mL/hr at 08/15/18 0500    Principal Problem:   SBO (small bowel obstruction) (  [place]) Active Problems:   History of colon cancer   Diabetes mellitus type 2 in obese [facility])   Essential hypertension   CKD (chronic kidney disease) stage 3, GFR 30-59 ml/min (HCC)    Anemia associated with chronic renal failure   Hypertension   Diabetes mellitus without complication (HCC)   Mobitz type 1 second degree atrioventricular block   Morbid obesity with body mass index of 40.0-49.9 ([place])   LOS: 12 days   Verneita Griffes, MD [facility] 8:45 AM

## 2018-08-15 NOTE — Progress Notes (Signed)
PHARMACY - ADULT TOTAL PARENTERAL NUTRITION CONSULT NOTE   Pharmacy Consult:  TPN Indication:  SBO  Patient Measurements: Height: 5' 7.01" (170.2 cm) Weight: 259 lb 11.2 oz (117.8 kg) IBW/kg (Calculated) : 66.12 TPN AdjBW (KG): 79.9 Body mass index is 40.67 kg/m.  Assessment:  33 YOM presented on 08/03/18 with abdominal pain.  Found to have SBO and was on SB protocol.  NGT clamped on 08/06/18 and patient was having BMs and flatus.  This AM 08/08/18, patient has abdominal pain and nausea, NGT set back to suction and 922mL of dark brown/coffee ground output seen.  Surgery thinks conservative management will not suffice and may require surgery.  Patient has minimal PO intake for 6 days and Pharmacy consulted to manage TPN given expected prolonged NPO status.  Patient reports an intentional 20 lbs weight loss over 7 months (from diet and exercise) and he was eating normally until the day of admission.  GI: s/p OR 7/14 for LoA. Prealbumin 15.6>14.6, NG O/P stable at 1481mL, clamping trial 7/20. LBM 7/19. Bisacodyl. PRN Zofran/Phenergan Endo: DM2 on insulin 70/30 PTA - CBGs now controlled <150 (113-144) Insulin requirements in past 24 hrs: down to 10 units SSI + Lantus 8/d (now d/c'd) + 120 units in TPN Lytes: Na normalized, K now trending up to 3.9, CO2 down to 23, others WNL Renal: CKD3 - SCr down to 2.2, BUN 46 (down) - UOP 0.8 ml/kg/hr, net -11.8L, 1/2NS 10K at 200/hr Pulm: sleep apnea - stable on RA Cards: HTN - VSS - Lasix x1 7/18 with transfusion Hepatobil: LFTs / tbili WNL, TG 127>258 Neuro: scheduled Robaxin, PRN Benadryl/morphine ID: afebrile, WBC 13.5 stable - not on abx Heme/Onc: colon cancer post hemicolectomy 1993, possible prostate cancer - Hg down to 7.9, plt WNL TPN Access: PICC placed 08/08/18 TPN start date: 08/08/18  Nutritional Goals (per RD rec 7/15): 2150-2350 kCal, 110-125gm protein, >/= 2L fluid per day  Current Nutrition:  TPN  Plan:  Continue TPN at goal rate 85  ml/hr TPN provides 120g AA, 286g CHO and 63g ILE for a total of 2085 kCal, meeting 97% of patient's caloric needs and 100% protein needs Electrolytes in TPN: add back low-dose Na, reduce K again to total 102 mEq K/day; Cl:Ac to 1:1 Daily multivitamin in TPN; trace elements MWF d/t national shortage Continue resistant SSI Q4H + 120 units regular insulin added to TPN bag and adjust as needed (D/c Lantus 8 units/day) Continue 1/2NS at 200 ml/hr to keep up with GI losses per discussion with Dr. Verlon Au Monitor AM labs, Surgery plans  Elicia Lamp, PharmD, BCPS Please check AMION for all [place] contact numbers Clinical Pharmacist 08/15/2018 7:56 AM

## 2018-08-15 NOTE — TOC Progression Note (Signed)
Transition of Care [facility]) - Progression Note    Patient Details  Name: Daniel Odom MRN: [id number] Date of Birth: 1936-01-14  Transition of [facility] [address] [address] CM/SW Contact  Jacalyn Lefevre Edson Snowball, RN Phone Number: 08/15/2018, 12:52 PM  Clinical Narrative:     See previous note   Expected Discharge Plan: [place] Barriers to Discharge: Continued Medical Work up  Expected Discharge Plan and Services Expected Discharge Plan: [place]   Discharge Planning Services: CM Consult Post Acute Care Choice: [place] arrangements for the past 2 months: Single Family Home                 DME Arranged: N/A         HH Arranged: RN [place] Agency: [place] Date [place]: 08/15/18 Time [place]: 1252 Representative spoke with at [place]: [place] ([place]) Interventions    Readmission Risk Interventions No flowsheet data found.

## 2018-08-15 NOTE — Evaluation (Signed)
Physical Therapy Evaluation Patient Details Name: Daniel Odom MRN: [id number] DOB: Dec 16, 1935 Today's Date: 08/15/2018   History of Present Illness  Pt is an 83 y/o male admitted secondary to abdominal pain and found to have an SBO s/p lysis of adhesions on 08/08/18. Pt also developed post op ileus. PMH including but not limited to HTN and DM.  Clinical Impression  Pt admitted secondary to problem above with deficits below. Pt requiring min to in guard A for mobility using RW this session. Pt with UE tremors, however, pt reporting this is baseline. Pt reports wife will be able to assist as needed at d/c. Will continue to follow acutely to maximize functional mobility independence and safety.     Follow Up Recommendations Home health PT;Supervision for mobility/OOB    Equipment Recommendations  None recommended by PT    Recommendations for Other Services       Precautions / Restrictions Precautions Precautions: Fall Restrictions Weight Bearing Restrictions: No      Mobility  Bed Mobility Overal bed mobility: Needs Assistance Bed Mobility: Supine to Sit     Supine to sit: Min assist     General bed mobility comments: Min A for trunk elevation to come to sitting.   Transfers Overall transfer level: Needs assistance Equipment used: Rolling walker (2 wheeled) Transfers: Sit to/from Stand Sit to Stand: Min assist         General transfer comment: Min A for lift assist and steadying assist. Pt able to maintain standing balance for prolonged period using RW for clean up following BM.   Ambulation/Gait Ambulation/Gait assistance: Min guard Gait Distance (Feet): 5 Feet Assistive device: Rolling walker (2 wheeled) Gait Pattern/deviations: Step-through pattern;Decreased stride length Gait velocity: Decreased    General Gait Details: Slow, shaky gait. Pt with BUE tremors that pt reports is baseline. Min guard for safety to walk to chair following clean up of BM. Cues for safe  use of RW.   Stairs            Wheelchair Mobility    Modified Rankin (Stroke Patients Only)       Balance Overall balance assessment: Needs assistance Sitting-balance support: No upper extremity supported;Feet supported Sitting balance-Leahy Scale: Good     Standing balance support: Bilateral upper extremity supported;During functional activity Standing balance-Leahy Scale: Poor Standing balance comment: Reliant on BUE support                              Pertinent Vitals/Pain Pain Assessment: Faces Faces Pain Scale: Hurts little more Pain Location: abdomen Pain Descriptors / Indicators: Aching;Operative site guarding Pain Intervention(s): Limited activity within patient's tolerance;Monitored during session;Repositioned    Home Living Family/patient expects to be discharged to:: Private residence Living Arrangements: Spouse/significant other Available Help at Discharge: Family;Available 24 hours/day Type of Home: House Home Access: Stairs to enter Entrance Stairs-Rails: Right;Left;Can reach both Entrance Stairs-Number of Steps: 4 Home Layout: Two level Home Equipment: Walker - 4 wheels Additional Comments: Pt reports he plans to stay down stairs    Prior Function Level of Independence: Independent with assistive device(s)         Comments: Was using rollator for mobility      Hand Dominance        Extremity/Trunk Assessment   Upper Extremity Assessment Upper Extremity Assessment: Generalized weakness(BUE tremors)    Lower Extremity Assessment Lower Extremity Assessment: Generalized weakness    Cervical / Trunk Assessment Cervical /  Trunk Assessment: Normal  Communication   Communication: HOH  Cognition Arousal/Alertness: Awake/alert Behavior During Therapy: WFL for tasks assessed/performed Overall Cognitive Status: Within Functional Limits for tasks assessed                                        General  Comments General comments (skin integrity, edema, etc.): Pt reports he has been ambulating in hall with mobility tech.     Exercises     Assessment/Plan    PT Assessment Patient needs continued PT services  PT Problem List Decreased strength;Decreased balance;Decreased mobility;Decreased knowledge of use of DME;Decreased knowledge of precautions       PT Treatment Interventions DME instruction;Gait training;Functional mobility training;Stair training;Therapeutic activities;Therapeutic exercise;Balance training;Patient/family education    PT Goals (Current goals can be found in the Care Plan section)  Acute Rehab PT Goals Patient Stated Goal: to get stronger PT Goal Formulation: With patient Time For Goal Achievement: 08/29/18 Potential to Achieve Goals: Good    Frequency Min 3X/week   Barriers to discharge        Co-evaluation               AM-PAC PT "6 Clicks" Mobility  Outcome Measure Help needed turning from your back to your side while in a flat bed without using bedrails?: A Little Help needed moving from lying on your back to sitting on the side of a flat bed without using bedrails?: A Little Help needed moving to and from a bed to a chair (including a wheelchair)?: A Little Help needed standing up from a chair using your arms (e.g., wheelchair or bedside chair)?: A Little Help needed to walk in hospital room?: A Little Help needed climbing 3-5 steps with a railing? : A Lot 6 Click Score: 17    End of Session   Activity Tolerance: Patient tolerated treatment well Patient left: in chair;with call bell/phone within reach;with nursing/sitter in room Nurse Communication: Mobility status;Other (comment)(pt had BM) PT Visit Diagnosis: Unsteadiness on feet (R26.81);Muscle weakness (generalized) (M62.81)    Time: 6568-1275 PT Time Calculation (min) (ACUTE ONLY): 23 min   Charges:   PT Evaluation $PT Eval Moderate Complexity: 1 Mod PT Treatments $Therapeutic  Activity: 8-22 mins        Leighton Ruff, PT, DPT  Acute Rehabilitation Services  Pager: [phone] Office: [phone]   Rudean Hitt 08/15/2018, 6:25 PM

## 2018-08-15 NOTE — Progress Notes (Signed)
[facility]/Trauma Progress Note  7 Days Post-Op   Assessment/Plan history of colon cancer status post right hemicolectomy history of prostate cancer diabetes mellitus type 2 Hypertension chronic kidney disease stage III  SBO -S/P LOA, enterorrhaphy, Dr. Kieth Brightly, 07/14 -pt tolerated NGT clamping and sips, pulledNGT 07/16and allowedFLD - NGT reinsertion07/17, clamped yesterday and pt tolerated - pt having flatus and BM's - continue wet to dry dressing changes - POC, daughter, Derek Jack ([facility] in [place])   FEN:NPO,NGT to [employer] to see residual,TPNuntil pt is tolerating a soft diet If NGT output low will pull today and advance diet VTE: SCD's,heparin LP:FXTK Foley:Dc foley07/15 Follow up:Dr. Kinsinger    LOS: 12 days    Subjective: CC: hungry  Pt wants to eat. No issues overnight. He states copious flatus and a BM yesterday. No nausea with tube clamped. No abdominal pain.   Objective: Vital signs in last 24 hours: Temp:  [98 F (36.7 C)-99.3 F (37.4 C)] 98.9 F (37.2 C) (07/21 0410) Pulse Rate:  [94-103] 94 (07/21 0410) Resp:  [17-19] 17 (07/21 0410) BP: (121-148)/(59-76) 121/76 (07/21 0410) SpO2:  [94 %-100 %] 100 % (07/21 0410) Weight:  [117.8 kg] 117.8 kg (07/21 0513) Last BM Date: 08/13/18  Intake/Output from previous day: 07/20 0701 - 07/21 0700 In: 3289.1 [P.O.:240; I.V.:2849.1; IV Piggyback:200] Out: 2825 [Urine:2225; Emesis/NG output:600] Intake/Output this shift: No intake/output data recorded.  PE: Gen: Alert, pleasant, cooperative HEENT: NGT in place, clamped Pulm:Rate andeffort normal Abd: Soft,obese,no distention,+BS, midline with pale clean tissues without purulent drainge,no TTP, no peritonitis Skin: no rashes noted, warm and dry   Anti-infectives: Anti-infectives (From admission, onward)   Start     Dose/Rate Route Frequency Ordered Stop   08/08/18 2200  gentamicin (GARAMYCIN) 180 mg in  dextrose 5 % 50 mL IVPB     1.5 mg/kg  121.3 kg 109 mL/hr over 30 Minutes Intravenous  Once 08/08/18 1621 08/09/18 0119   08/08/18 2000  clindamycin (CLEOCIN) IVPB 600 mg     600 mg 100 mL/hr over 30 Minutes Intravenous  Once 08/08/18 1621 08/08/18 2020   08/08/18 0945  clindamycin (CLEOCIN) IVPB 600 mg     600 mg 100 mL/hr over 30 Minutes Intravenous On call to O.R. 08/08/18 0936 08/08/18 1410   08/08/18 0945  gentamicin (GARAMYCIN) 180 mg in dextrose 5 % 50 mL IVPB     1.5 mg/kg  121.3 kg 109 mL/hr over 30 Minutes Intravenous On call to O.R. 08/08/18 0936 08/08/18 1445      Lab Results:  Recent Labs    08/14/18 0332 08/15/18 0444  WBC 13.5* 13.5*  HGB 8.3* 7.9*  HCT 24.5* 23.0*  PLT 266 299   BMET Recent Labs    08/14/18 0332 08/15/18 0444  NA 150* 143  K 3.7 3.9  CL 110 110  CO2 30 23  GLUCOSE 143* 133*  BUN 47* 46*  CREATININE 2.27* 2.20*  CALCIUM 8.8* 8.3*   PT/INR No results for input(s): LABPROT, INR in the last 72 hours. CMP     Component Value Date/Time   NA 143 08/15/2018 0444   K 3.9 08/15/2018 0444   CL 110 08/15/2018 0444   CO2 23 08/15/2018 0444   GLUCOSE 133 (H) 08/15/2018 0444   BUN 46 (H) 08/15/2018 0444   CREATININE 2.20 (H) 08/15/2018 0444   CALCIUM 8.3 (L) 08/15/2018 0444   PROT 6.0 (L) 08/15/2018 0444   ALBUMIN 2.3 (L) 08/15/2018 0444   AST 29 08/15/2018 0444  ALT 27 08/15/2018 0444   ALKPHOS 79 08/15/2018 0444   BILITOT 0.5 08/15/2018 0444   GFRNONAA 27 (L) 08/15/2018 0444   GFRAA 31 (L) 08/15/2018 0444   Lipase     Component Value Date/Time   LIPASE 31 08/03/2018 1445    Studies/Results: Dg Abd Portable 1v  Result Date: 08/14/2018 CLINICAL DATA:  Ileus EXAM: PORTABLE ABDOMEN - 1 VIEW COMPARISON:  08/11/2018 FINDINGS: NG tube coils in the fundus of the stomach. Mild gaseous distention of bowel, improved since prior study, likely improving ileus. No organomegaly or free air. IMPRESSION: Mild gaseous distention of both large  and small bowel, improving since prior study, favor improving ileus. Electronically Signed   By: Rolm Baptise M.D.   On: 08/14/2018 11:15      Kalman Drape , [facility] 08/15/2018, 8:36 AM  Pager: [phone] Mon-Wed, Friday 7:00am-4:30pm Thurs 7am-11:30am  Consults: [phone]

## 2018-08-16 LAB — GLUCOSE, CAPILLARY
Glucose-Capillary: 150 mg/dL — ABNORMAL HIGH (ref 70–99)
Glucose-Capillary: 151 mg/dL — ABNORMAL HIGH (ref 70–99)
Glucose-Capillary: 157 mg/dL — ABNORMAL HIGH (ref 70–99)
Glucose-Capillary: 218 mg/dL — ABNORMAL HIGH (ref 70–99)
Glucose-Capillary: 123 mg/dL — ABNORMAL HIGH (ref 70–99)

## 2018-08-16 LAB — MAGNESIUM: Magnesium: 1.8 mg/dL (ref 1.7–2.4)

## 2018-08-16 LAB — BASIC METABOLIC PANEL
Anion gap: 8 (ref 5–15)
BUN: 42 mg/dL — ABNORMAL HIGH (ref 8–23)
CO2: 19 mmol/L — ABNORMAL LOW (ref 22–32)
Calcium: 8 mg/dL — ABNORMAL LOW (ref 8.9–10.3)
Chloride: 111 mmol/L (ref 98–111)
Creatinine, Ser: 1.88 mg/dL — ABNORMAL HIGH (ref 0.61–1.24)
GFR calc Af Amer: 38 mL/min — ABNORMAL LOW (ref >60)
GFR calc non Af Amer: 33 mL/min — ABNORMAL LOW (ref >60)
Glucose, Bld: 167 mg/dL — ABNORMAL HIGH (ref 70–99)
Potassium: 4.2 mmol/L (ref 3.5–5.1)
Sodium: 138 mmol/L (ref 135–145)

## 2018-08-16 LAB — PHOSPHORUS: Phosphorus: 3.8 mg/dL (ref 2.5–4.6)

## 2018-08-16 MED ORDER — TRAVASOL 10 % IV SOLN
INTRAVENOUS | Status: DC
  Administered 2018-08-16: 18:00:00 via INTRAVENOUS
  Filled 2018-08-16: qty 1203.6

## 2018-08-16 NOTE — Progress Notes (Addendum)
PHARMACY - ADULT TOTAL PARENTERAL NUTRITION CONSULT NOTE   Pharmacy Consult: TPN Indication: SBO  Patient Measurements: Height: 5' 7.01" (170.2 cm) Weight: 259 lb 11.2 oz (117.8 kg) IBW/kg (Calculated) : 66.12 TPN AdjBW (KG): 79.9 Body mass index is 40.67 kg/m.   Assessment:  78 YOM presented on 08/03/18 with abdominal pain.  Found to have SBO and was on SB protocol.  NGT clamped on 08/06/18 and patient was having BMs and flatus.  This AM 08/08/18, patient has abdominal pain and nausea, NGT set back to suction and 937mL of dark brown/coffee ground output seen.  Surgery thinks conservative management will not suffice and may require surgery.  Patient has minimal PO intake for 6 days and Pharmacy consulted to manage TPN given expected prolonged NPO status.  Patient reports an intentional 20 lbs weight loss over 7 months (from diet and exercise) and he was eating normally until the day of admission.  GI: s/p OR 7/14 for LoA. Prealbumin 15.6>14.6, NG O/P stable at 1465mL, clamping trial 7/20. LBM 7/19. Bisacodyl. PRN Zofran/Phenergan  Endo: DM2 on insulin 70/30 PTA - CBGs now controlled <150 (113-190) Insulin requirements in past 24 hrs: 21 units SSI + 120 units in TPN  Lytes: Na normalized, K trending up to 4.2, CO2 down to 23, others WNL, CoCa 9.3 Renal: CKD3 - SCr down to 2.8 >1.8, BUN 46 (down) - UOP 0.8 ml/kg/hr, net -11.8L, 1/2NS 10K at 200/hr > reduce to 100 ml/hr Patient is acidotic Pulm: sleep apnea - stable on RA Cards: HTN - VSS - Lasix x1 7/18 with transfusion Hepatobil: LFTs / tbili WNL, TG 127>258 Neuro: scheduled Robaxin, PRN Benadryl/morphine ID: afebrile, WBC 13.5 stable - not on abx Heme/Onc: colon cancer post hemicolectomy 1993, possible prostate cancer - Hg down to 7.9, plt WNL TPN Access: PICC placed 08/08/18 TPN start date: 08/08/18  Nutritional Goals (per RD rec 7/21): 2150-2350 kCal 110-125 gm protein >/ 2L fluid per day  Current Nutrition:  TPN  Plan:   -Continue TPN at goal rate 85 ml/hr -TPN provides 120 g AA, 286 g CHO and 63 g ILE for a total of 2085 kCal, meeting 97% of patient's caloric needs and 100% protein needs -Electrolytes in TPN: add back low-dose Na, reduce K again to total 102 mEq K/day; Cl:Ac to max acetate -Daily multivitamin in TPN; trace elements MWF d/t national shortage -Continue resistant SSI Q4H + 120 units regular insulin added to TPN bag  -Reduce 1/2NS to 100 ml/hr  -Monitor AM labs, Surgery plans   Harvel Quale 08/16/2018 7:50 AM

## 2018-08-16 NOTE — Progress Notes (Signed)
[facility]/Trauma Progress Note  8 Days Post-Op   Assessment/Plan history of colon cancer status post right hemicolectomy history of prostate cancer diabetes mellitus type 2 Hypertension chronic kidney disease stage III  SBO -S/P LOA, enterorrhaphy, Dr. Kieth Brightly, 07/14 -ileus appears to be improving, advance to FLD - pt having flatus and BM's - continue wet to dry dressing changes - POC, daughter, Derek Jack [facility] in [place])   FEN:FLD VTE: SCD's,heparin LP:FXTK Foley:Dc foley07/15 Follow up:Dr. Kieth Brightly   LOS: 13 days    Subjective: CC: no complaints  BM's yesterday none yet today. Copious flatus. No nausea or vomiting. Tolerating clears.   Objective: Vital signs in last 24 hours: Temp:  [98.8 F (37.1 C)-99.8 F (37.7 C)] 98.8 F (37.1 C) (07/22 0550) Pulse Rate:  [77-92] 77 (07/22 0550) Resp:  [17-18] 18 (07/22 0550) BP: (116-156)/(60-65) 156/60 (07/22 0550) SpO2:  [99 %-100 %] 99 % (07/22 0550) Last BM Date: 08/15/18  Intake/Output from previous day: 07/21 0701 - 07/22 0700 In: 1700 [I.V.:1600; IV Piggyback:100] Out: 2825 [Urine:2825] Intake/Output this shift: No intake/output data recorded.  PE: Gen: Alert, pleasant, cooperative Pulm:Rate andeffort normal Abd: Soft,obese,nodistention,+BS, midline C/D/I,no TTP, no peritonitis Skin: no rashes noted, warm and dry   Anti-infectives: Anti-infectives (From admission, onward)   Start     Dose/Rate Route Frequency Ordered Stop   08/08/18 2200  gentamicin (GARAMYCIN) 180 mg in dextrose 5 % 50 mL IVPB     1.5 mg/kg  121.3 kg 109 mL/hr over 30 Minutes Intravenous  Once 08/08/18 1621 08/09/18 0119   08/08/18 2000  clindamycin (CLEOCIN) IVPB 600 mg     600 mg 100 mL/hr over 30 Minutes Intravenous  Once 08/08/18 1621 08/08/18 2020   08/08/18 0945  clindamycin (CLEOCIN) IVPB 600 mg     600 mg 100 mL/hr over 30 Minutes Intravenous On call to O.R. 08/08/18 0936  08/08/18 1410   08/08/18 0945  gentamicin (GARAMYCIN) 180 mg in dextrose 5 % 50 mL IVPB     1.5 mg/kg  121.3 kg 109 mL/hr over 30 Minutes Intravenous On call to O.R. 08/08/18 0936 08/08/18 1445      Lab Results:  Recent Labs    08/14/18 0332 08/15/18 0444  WBC 13.5* 13.5*  HGB 8.3* 7.9*  HCT 24.5* 23.0*  PLT 266 299   BMET Recent Labs    08/15/18 0444 08/16/18 0349  NA 143 138  K 3.9 4.2  CL 110 111  CO2 23 19*  GLUCOSE 133* 167*  BUN 46* 42*  CREATININE 2.20* 1.88*  CALCIUM 8.3* 8.0*   PT/INR No results for input(s): LABPROT, INR in the last 72 hours. CMP     Component Value Date/Time   NA 138 08/16/2018 0349   K 4.2 08/16/2018 0349   CL 111 08/16/2018 0349   CO2 19 (L) 08/16/2018 0349   GLUCOSE 167 (H) 08/16/2018 0349   BUN 42 (H) 08/16/2018 0349   CREATININE 1.88 (H) 08/16/2018 0349   CALCIUM 8.0 (L) 08/16/2018 0349   PROT 6.0 (L) 08/15/2018 0444   ALBUMIN 2.3 (L) 08/15/2018 0444   AST 29 08/15/2018 0444   ALT 27 08/15/2018 0444   ALKPHOS 79 08/15/2018 0444   BILITOT 0.5 08/15/2018 0444   GFRNONAA 33 (L) 08/16/2018 0349   GFRAA 38 (L) 08/16/2018 0349   Lipase     Component Value Date/Time   LIPASE 31 08/03/2018 1445    Studies/Results: Dg Abd Portable 1v  Result Date: 08/14/2018 CLINICAL DATA:  Ileus EXAM: PORTABLE ABDOMEN - 1 VIEW COMPARISON:  08/11/2018 FINDINGS: NG tube coils in the fundus of the stomach. Mild gaseous distention of bowel, improved since prior study, likely improving ileus. No organomegaly or free air. IMPRESSION: Mild gaseous distention of both large and small bowel, improving since prior study, favor improving ileus. Electronically Signed   By: Rolm Baptise M.D.   On: 08/14/2018 11:15      Kalman Drape , [facility] Surgery 08/16/2018, 7:58 AM  Pager: [phone] Mon-Wed, Friday 7:00am-4:30pm Thurs 7am-11:30am  Consults: [phone]

## 2018-08-16 NOTE — Care Management Important Message (Signed)
Important Message  Patient Details  Name: Daniel Odom MRN: [id number] Date of Birth: July 18, 1935   Medicare Important Message Given:        Memory Argue 08/16/2018, 4:21 PM    SIGNED

## 2018-08-16 NOTE — Progress Notes (Signed)
PROGRESS NOTE  Daniel Odom MHD:[id number] DOB: [birth year] DOA: 08/03/2018 PCP: [facility]  Brief History   83 year Odom man PMH colon cancer status post right hemicolectomy presented with abdominal pain.  Admitted for small bowel obstruction.  Failed conservative management.  Underwent operative intervention 7/14. Post-op ileus in addition to anemia Will be here convalescing as still has High OP ostomy  A & P  Small bowel obstruction status post lysis of adhesions 7/14.  PMH colon cancer status post right hemicolectomy. --General surgery managing--episodic vomiting x2 overnight 7/16 NG tube replaced per surgery --TPN till full reg diet  Diabetes mellitus type 2-Home dose NPH 7 AM and 30 p.m. --Sugars elevated however greatly appreciate Pharm.D. assisting with TPN mixture --Glycemia managed via TPN mixture with insulin  Acute kidney injury superimposed CKD stage III Prostate cancer + radiation + salvage cryoablation [place]-follows with Dr. Morrison Odom --- last visit 07/08/2016 suggested microscopic prostate cancer Underlying imaging incontinence Mild hypokalemia probably related to GI losses ongoing --Worsened likely perioperatively but on the road to recovery-i .Creatinine was 1.87 in February 07, 2017.    hypernatremic 2/2 loss succus Hypercholoremic, probably a sa response to less NG output and TPn and fluid replacement --->d/w TPN pharmacist-thank you ivf today 100 cc/h 1/2 NS, labs in am  Anemia, likely anemia from CKD Anemia expected Blood loss from surgery component hemodilution --transfused x 1 U PRBC 7/18 Labs stabilizing in the 7-8 range transfusion threshold is below 7   Essential hypertension complicated by perioperative hypotension --Has been stable off medications  OSA --Continue CPAP each night as tolerated although likely will be unable to use given patient's NG tube in place  DVT prophylaxis: heparin, SCDs Code Status: Full Family  Communication: Daniel Odom updated fully 7/21 --stable for past cpl days Disposition Plan: home when able to tol diet and off TPN--PT recs HH Daniel Griffes, MD [facility] 9:02 AM  08/16/2018, 9:02 AM  LOS: 13 days   Consultants  . General surgery  Procedures  .   Antibiotics  .   Interval History/Subjective   NG out Drinking fair No cp no fever abd pain manageable No n/v yet today   Objective   Vitals:  Vitals:   08/15/18 2100 08/16/18 0550  BP: 127/65 (!) 156/60  Pulse: 89 77  Resp: 18 18  Temp: 99.8 F (37.7 C) 98.8 F (37.1 C)  SpO2: 100% 99%    Exam:  unchanged  comfortable sitting in bed Abdomen--distended slight tender epigastrium clinically clear in lateral lung fields External ocular movements are intact Slightly tachycardic-seems NSR No lower extremity edema Neurologically intact    I have personally reviewed the following:   Today's Data  Na 149-->150-->143-->138  k3-->3.5-->4.2  CO2r 36-->34--->23---19  bun/cr 55/2.8-->56/2.66--->47/2.2-->46/2.2--->42/1.88  Hemoglobin down from 9.8-8.4 currently 7.4--->7.7 -->7.0---8.3-->7.9 on 7/21  Imaging  .  Other Data  .   Scheduled Meds: . bisacodyl  10 mg Rectal Daily  . heparin injection (subcutaneous)  5,000 Units Subcutaneous Q8H  . insulin aspart  0-20 Units Subcutaneous Q4H  . lip balm  1 application Topical BID   Continuous Infusions: . sodium chloride 200 mL/hr at 08/16/18 0340  . methocarbamol (ROBAXIN) IV 1,000 mg (08/16/18 0513)  . ondansetron (ZOFRAN) IV 8 mg (08/11/18 1309)  . TPN ADULT (ION) 85 mL/hr at 08/15/18 1753    Principal Problem:   SBO (small bowel obstruction) (HCC) Active Problems:   History of colon cancer   Diabetes mellitus type 2 in obese ([place])  Essential hypertension   CKD (chronic kidney disease) stage 3, GFR 30-59 ml/min (HCC)   Anemia associated with chronic renal failure   Hypertension   Diabetes mellitus without complication (HCC)    Mobitz type 1 second degree atrioventricular block   Morbid obesity with body mass index of 40.0-49.9 ([place])   LOS: 13 days   Daniel Griffes, MD [facility] 9:02 AM

## 2018-08-16 NOTE — Progress Notes (Signed)
CPAP set up at bedside 10.0 cm H20 per patient home settings. FFM. Paitent tolerating well at this time.  RN at bedside to administer medication.  RN to placed mask when finished. Patient aware to call for RN or RT if any assistance needed.

## 2018-08-16 NOTE — TOC Progression Note (Signed)
Transition of Care [facility]) - Progression Note    Patient Details  Name: Daniel Odom MRN: [id number] Date of Birth: [birth year]  Transition of Care [facility] CM/SW Contact  Marilu Favre, RN Phone Number: 08/16/2018, 11:19 AM  Clinical Narrative:     Added HHPT to [facility]   Expected Discharge Plan: [place] Barriers to Discharge: Continued Medical Work up  Expected Discharge Plan and Services Expected Discharge Plan: [place]   Discharge Planning Services: CM Consult Post Acute Care Choice: [place] arrangements for the past 2 months: Single Family Home                 DME Arranged: N/A         HH Arranged: RN [place] Agency: [place] Date [facility] Agency Contacted: 08/16/18 Time HH Agency Contacted: [id number] Representative spoke with at [place]: cory   Social Determinants of Health ([place]) Interventions    Readmission Risk Interventions No flowsheet data found.

## 2018-08-16 NOTE — Progress Notes (Signed)
Nutrition Follow-up  DOCUMENTATION CODES:   Morbid obesity  INTERVENTION:   -TPN management per pharmacy -RD will follow for diet advancement and supplement diet as appropriate  NUTRITION DIAGNOSIS:   Inadequate oral intake related to altered GI function as evidenced by NPO status.  Ongoing  GOAL:   Patient will meet greater than or equal to 90% of their needs  Met with TPN  MONITOR:   Labs, I & O's, Weight trends, Skin, Diet advancement  REASON FOR ASSESSMENT:   Consult New TPN/TNA  ASSESSMENT:   83 year old male who presented to the ED on 7/09 with abdominal pain. PMH of HTN, T2DM, colon cancer s/p right hemicolectomy, prostate cancer, CKD stage III, anemia. CT abdomen showing small bowel obstruction. NG tube placed.  7/12 - NG tube clamping trial, sips of clears 7/14 - NG tube back to suction with 900 ml dark brown/coffee ground output, s/p LOA and enterorrhaphy, TPN initiated 7/16- NGT inserted, removed 7/17- NGT replaced due to vomiting 7/21- NGT removed, advanced to clear liquid diet 7/22- advanced to full liquids  Reviewed I/O's: -1.1 L x 24 hours and -13 L since admission  UOP: 2.8 L x 24 hours  Spoke with pt, who was sitting in recliner chair and in good spirits today; happy to have NGT removed. He reports good tolerance of full liquid diet, consuming "almost all" of it. PTA he reports having a great appetite, consuming 3 meals per day. He denies any weight loss.   Pt remains on TPN- receiving @ 85 ml/hr, which provides 2085 kcals and 120 grams protein, meeting 97% of estimated kcal needs and 100% of estimated protein needs. Per pharmacy notes, plan to continue this regimen tonight.  Discussed with pt importance of good meal intake to promote healing as well as diet progression and weaning of TPN as appropriate.    IVFS: 0.45% NaCl @ 100 ml/hr  Labs reviewed: K, Mg, and Phos WDL. CBGS: 141-150 (inpatient orders for glycemic control are 0-20 units  insulin aspart every 4 hours and 120 units regular insulin added to TPN).   NUTRITION - FOCUSED PHYSICAL EXAM:    Most Recent Value  Orbital Region  No depletion  Upper Arm Region  No depletion  Thoracic and Lumbar Region  No depletion  Buccal Region  No depletion  Temple Region  No depletion  Clavicle Bone Region  No depletion  Clavicle and Acromion Bone Region  No depletion  Scapular Bone Region  No depletion  Dorsal Hand  No depletion  Patellar Region  No depletion  Anterior Thigh Region  No depletion  Posterior Calf Region  No depletion  Edema (RD Assessment)  Mild  Hair  Reviewed  Eyes  Reviewed  Mouth  Reviewed  Skin  Reviewed  Nails  Reviewed       Diet Order:   Diet Order            Diet full liquid Room service appropriate? Yes; Fluid consistency: Thin  Diet effective now              EDUCATION NEEDS:   No education needs have been identified at this time  Skin:  Skin Assessment: Skin Integrity Issues: Skin Integrity Issues:: Incisions Incisions: closed incision to abdomen  Last BM:  08/13/18  Height:   Ht Readings from Last 1 Encounters:  08/08/18 5' 7.01" (1.702 m)    Weight:   Wt Readings from Last 1 Encounters:  08/15/18 117.8 kg    Ideal Body Weight:  67.3 kg  BMI:  Body mass index is 40.67 kg/m.  Estimated Nutritional Needs:   Kcal:  2150-2350  Protein:  110-125 grams  Fluid:  >/= 2.0 L    Lanique Gonzalo A. Jimmye Norman, RD, LDN, [place] Registered Dietitian II Certified Diabetes Care and Education Specialist Pager: [phone] After hours Pager: [phone]

## 2018-08-17 DIAGNOSIS — K56609 Unspecified intestinal obstruction, unspecified as to partial versus complete obstruction: Secondary | ICD-10-CM

## 2018-08-17 LAB — COMPREHENSIVE METABOLIC PANEL WITH GFR
ALT: 46 U/L — ABNORMAL HIGH (ref 0–44)
AST: 42 U/L — ABNORMAL HIGH (ref 15–41)
Albumin: 2.2 g/dL — ABNORMAL LOW (ref 3.5–5.0)
Alkaline Phosphatase: 80 U/L (ref 38–126)
Anion gap: 7 (ref 5–15)
BUN: 41 mg/dL — ABNORMAL HIGH (ref 8–23)
CO2: 17 mmol/L — ABNORMAL LOW (ref 22–32)
Calcium: 7.8 mg/dL — ABNORMAL LOW (ref 8.9–10.3)
Chloride: 110 mmol/L (ref 98–111)
Creatinine, Ser: 1.83 mg/dL — ABNORMAL HIGH (ref 0.61–1.24)
GFR calc Af Amer: 39 mL/min — ABNORMAL LOW
GFR calc non Af Amer: 34 mL/min — ABNORMAL LOW
Glucose, Bld: 129 mg/dL — ABNORMAL HIGH (ref 70–99)
Potassium: 4.3 mmol/L (ref 3.5–5.1)
Sodium: 134 mmol/L — ABNORMAL LOW (ref 135–145)
Total Bilirubin: 0.4 mg/dL (ref 0.3–1.2)
Total Protein: 5.8 g/dL — ABNORMAL LOW (ref 6.5–8.1)

## 2018-08-17 LAB — GLUCOSE, CAPILLARY
Glucose-Capillary: 105 mg/dL — ABNORMAL HIGH (ref 70–99)
Glucose-Capillary: 122 mg/dL — ABNORMAL HIGH (ref 70–99)
Glucose-Capillary: 124 mg/dL — ABNORMAL HIGH (ref 70–99)
Glucose-Capillary: 128 mg/dL — ABNORMAL HIGH (ref 70–99)
Glucose-Capillary: 133 mg/dL — ABNORMAL HIGH (ref 70–99)

## 2018-08-17 LAB — MAGNESIUM: Magnesium: 1.9 mg/dL (ref 1.7–2.4)

## 2018-08-17 LAB — PHOSPHORUS: Phosphorus: 3.2 mg/dL (ref 2.5–4.6)

## 2018-08-17 MED ORDER — CALCIUM CARBONATE 1250 (500 CA) MG PO TABS
500.0000 mg | ORAL_TABLET | Freq: Two times a day (BID) | ORAL | Status: DC
  Administered 2018-08-17: 500 mg via ORAL
  Filled 2018-08-17: qty 1

## 2018-08-17 MED ORDER — ATORVASTATIN CALCIUM 80 MG PO TABS: 80.0000 mg | ORAL_TABLET | Freq: Every day | ORAL | Status: DC

## 2018-08-17 MED ORDER — PRIMIDONE 50 MG PO TABS: 100.0000 mg | ORAL_TABLET | ORAL | Status: DC

## 2018-08-17 MED ORDER — PRIMIDONE 50 MG PO TABS
150.0000 mg | ORAL_TABLET | Freq: Every day | ORAL | Status: DC
  Filled 2018-08-17: qty 3

## 2018-08-17 MED ORDER — VITAMIN D 25 MCG (1000 UNIT) PO TABS
1000.0000 [IU] | ORAL_TABLET | Freq: Every day | ORAL | Status: DC
  Administered 2018-08-17: 1000 [IU] via ORAL
  Filled 2018-08-17: qty 1

## 2018-08-17 MED ORDER — PRIMIDONE 50 MG PO TABS
100.0000 mg | ORAL_TABLET | Freq: Every day | ORAL | Status: DC
  Administered 2018-08-17: 100 mg via ORAL
  Filled 2018-08-17: qty 2

## 2018-08-17 MED ORDER — CYCLOBENZAPRINE HCL 10 MG PO TABS: 10.0000 mg | ORAL_TABLET | Freq: Three times a day (TID) | ORAL | Status: DC | PRN

## 2018-08-17 NOTE — Discharge Summary (Addendum)
Discharge Summary  Daniel Odom MAU:[id number] DOB: Jun 01, 1935  PCP: [facility]  Admit date: 08/03/2018 Discharge date: 08/17/2018  Time spent: 35 minutes  Recommendations for Outpatient Follow-up:  1. Follow-up with general surgery 2. Follow-up with your primary care provider 3. Take your medications as prescribed 4. Continue physical therapy 5. Fall precautions  Discharge Diagnoses:  Active Hospital Problems   Diagnosis Date Noted   SBO (small bowel obstruction) ([place]) 08/03/2018   Mobitz type 1 second degree atrioventricular block 08/05/2018   Morbid obesity with body mass index of 40.0-49.9 ( [place]) 08/05/2018   Hypertension    Diabetes mellitus without complication ([place])    History of colon cancer 08/03/2018   Diabetes mellitus type 2 in obese ([place]) 08/03/2018   Essential hypertension 08/03/2018   Anemia associated with chronic renal failure 08/03/2018   CKD (chronic kidney disease) stage 3, GFR 30-59 ml/min (HCC) 02/07/2017    Resolved Hospital Problems  No resolved problems to display.    Discharge Condition: Stable  Diet recommendation: Resume previous diet  Vitals:   08/16/18 2041 08/17/18 0437  BP: (!) 141/67 (!) 131/58  Pulse: 81 81  Resp: 17 18  Temp: 99 F (37.2 C) 98.9 F (37.2 C)  SpO2: 100% 100%    History of present illness:   83 year old man PMH colon cancer status post right hemicolectomy presented with abdominal pain.  Admitted for small bowel obstruction.  Failed conservative management.  Underwent operative intervention 7/14. Post-op ileus in addition to anemia.  Ileus is resolving.  He has been having bowel movements and tolerating a soft diet without difficulty.  08/17/18: Patient was seen and examined at his bedside this morning.  No acute events overnight.  States he feels better.  He is tolerating a soft diet well.  His last bowel movement was yesterday x2.  Denies any cardiopulmonary symptoms.  He has no new complaints.   Seen by general surgery this morning okay to discharge from a surgical standpoint.  Vital signs and lab studies reviewed and are stable.  On the day of discharge, the patient was hemodynamically stable.  He will need to follow-up with general surgery and his primary care provider posthospitalization.  He will also need to continue physical therapy.  Fall precautions.  Hospital Course:  Principal Problem:   SBO (small bowel obstruction) (HCC) Active Problems:   History of colon cancer   Diabetes mellitus type 2 in obese [facility])   Essential hypertension   CKD (chronic kidney disease) stage 3, GFR 30-59 ml/min (HCC)   Anemia associated with chronic renal failure   Hypertension   Diabetes mellitus without complication (HCC)   Mobitz type 1 second degree atrioventricular block   Morbid obesity with body mass index of 40.0-49.9 (HCC)  Resolving small bowel obstruction status post lysis of adhesions 7/14.  PMH colon cancer status post right hemicolectomy. --General surgery managed -Tolerating a soft diet -Last BM 08/16/18 -Positive flatus 08/17/18 -Denies nausea, abd cramping, no new symptoms Follow up with General surgery outpatient  Diabetes mellitus type 2 Obtain HgA1C outpatient Resume home regimen Follow up with your PCP  AKI on CKD3 Continue to avoid nephrotoxins Creatinine 1.83 from 2.27 on presentation.   Follow-up with her primary care provider outpatient  Anemia of chronic disease from CKD  Hg 7.9 No sign of overt bleeding Transfused 1 unit PRBC on 08/12/18  Essential hypertension complicated by perioperative hypotension --Has been stable off medications -Blood pressure is currently normotensive.  OSA --Continue CPAP at night  Essential tremors Resume home medication primidone  Hyperlipidemia Resume home Lipitor  Physical debility/ambulatory dysfunction PT assessed and recommended home health PT Continue physical therapy Fall precautions   Code Status:  Full  Consultants   General surgery  Procedures   NG tube placement  Antibiotics   None   Discharge Exam: BP (!) 131/58 (BP Location: Left Arm)    Pulse 81    Temp 98.9 F (37.2 C) (Oral)    Resp 18    Ht 5' 7.01" (1.702 m)    Wt 119.7 kg    SpO2 100%    BMI 41.32 kg/m   General: 83 y.o. year-old male well developed well nourished in no acute distress.  Alert and oriented x3.  Cardiovascular: Regular rate and rhythm with no rubs or gallops.  No thyromegaly or JVD noted.    Respiratory: Clear to auscultation with no wheezes or rales. Good inspiratory effort.  Abdomen: Surgical dressing in place mid abdomen.  Bowel sounds present.  Nontender.    Musculoskeletal: No lower extremity edema. 2/4 pulses in all 4 extremities.  Psychiatry: Mood is appropriate for condition and setting  Discharge Instructions You were cared for by a hospitalist during your hospital stay. If you have any questions about your discharge medications or the care you received while you were in the hospital after you are discharged, you can call the unit and asked to speak with the hospitalist on call if the hospitalist that took care of you is not available. Once you are discharged, your primary care physician will handle any further medical issues. Please note that NO REFILLS for any discharge medications will be authorized once you are discharged, as it is imperative that you return to your primary care physician (or establish a relationship with a primary care physician if you do not have one) for your aftercare needs so that they can reassess your need for medications and monitor your lab values.   Allergies as of 08/17/2018      Reactions   Penicillins Hives   Did it involve swelling of the face/tongue/throat, SOB, or low BP? Yes Did it involve sudden or severe rash/hives, skin peeling, or any reaction on the inside of your mouth or nose? No Did you need to seek medical attention at a hospital or  doctor's office? Yes When did it last happen?1968 If all above answers are NO, may proceed with cephalosporin use.      Medication List    STOP taking these medications   aspirin 81 MG chewable tablet   lisinopril-hydrochlorothiazide 20-12.5 MG tablet Commonly known as: ZESTORETIC     TAKE these medications   Albuterol Sulfate 108 (90 Base) MCG/ACT Aepb Inhale 2 puffs into the lungs every 6 (six) hours as needed for wheezing.   atorvastatin 80 MG tablet Commonly known as: LIPITOR Take 80 mg by mouth at bedtime.   CALCIUM PO Take 1 tablet by mouth 2 (two) times a day.   cyclobenzaprine 10 MG tablet Commonly known as: FLEXERIL Take 10 mg by mouth 3 (three) times daily as needed for muscle spasms.   fluticasone 50 MCG/ACT nasal spray Commonly known as: FLONASE Place 1 spray into both nostrils daily as needed for allergies or rhinitis.   mometasone 220 MCG/INH inhaler Commonly known as: ASMANEX Inhale 2 puffs into the lungs at bedtime.   NovoLIN 70/30 (70-30) 100 UNIT/ML injection Generic drug: insulin NPH-regular Human Inject 7-30 Units into the skin See admin instructions. 7u in the AM and  30 in the evening   polyethylene glycol 17 g packet Commonly known as: MIRALAX / GLYCOLAX Take 17 g by mouth 2 (two) times a day.   primidone 50 MG tablet Commonly known as: MYSOLINE Take 100-150 mg by mouth See admin instructions. 100mg  in the AM and 150mg  PM   Refresh Plus 0.5 % Soln Generic drug: Carboxymethylcellulose Sod PF Place 1 drop into both eyes 2 (two) times daily as needed (dry eyes).   Vitamin D-1000 Max St 25 MCG (1000 UT) tablet Generic drug: Cholecalciferol Take 1,000 Units by mouth daily.      Allergies  Allergen Reactions   Penicillins Hives    Did it involve swelling of the face/tongue/throat, SOB, or low BP? Yes Did it involve sudden or severe rash/hives, skin peeling, or any reaction on the inside of your mouth or nose? No Did you need to  seek medical attention at a hospital or doctor's office? Yes When did it last happen?1968 If all above answers are NO, may proceed with cephalosporin use.    Follow-up Information    Kinsinger, Arta Bruce, MD. Go on 08/25/2018.   Specialty: General Surgery Why: at 9:20am for post op follow up and staple removal. please arrive 30 minutes prior to complete paperwork. Please bring photo ID and insurance card Contact information: [facility] [address] [phone]        [facility] Call in 1 day(s).   Why: Please call for a post hospital follow-up appointment. Contact information: [place] [address] [phone]            The results of significant diagnostics from this hospitalization (including imaging, microbiology, ancillary and laboratory) are listed below for reference.    Significant Diagnostic Studies: Ct Abdomen Pelvis Wo Contrast  Result Date: 08/03/2018 CLINICAL DATA:  History of small bowel obstruction. Recurrent symptoms. EXAM: CT ABDOMEN AND PELVIS WITHOUT CONTRAST TECHNIQUE: Multidetector CT imaging of the abdomen and pelvis was performed following the standard protocol without IV contrast. COMPARISON:  None. FINDINGS: Lower chest: Normal Hepatobiliary: Liver parenchyma is normal.  No calcified gallstones. Pancreas: Normal Spleen: Normal Adrenals/Urinary Tract: Adrenal glands show mild hyperplasia. No focal renal lesion. The kidneys are slightly small. No obstruction. Bladder appears normal. Stomach/Bowel: Acute small bowel obstruction with dilated fluid and air-filled small intestine. The distal small bowel is collapsed. There is ascites, often seen with small-bowel obstruction. There is sigmoid diverticulosis without evidence of diverticulitis. Previous colon anastomosis in the region of the hepatic flexure colon is patulous at that level, possibly due to denervation. Vascular/Lymphatic:  Aortic atherosclerosis. No aneurysm. IVC is normal. No retroperitoneal adenopathy. Reproductive: Multiple prostate seed implants. Other: No free air.  Ascites as noted above. Musculoskeletal: Ordinary lower lumbar degenerative changes. IMPRESSION: Small-bowel obstruction in about the midportion. Partial or early. Distal small bowel is not dilated. Stool in the colon. Previous colon resection/anastomosis in the hepatic flexure region. Small amount of ascites presumably associated with the small bowel obstruction. No free air. Electronically Signed   By: Nelson Chimes M.D.   On: 08/03/2018 20:50   Dg Abd 1 View  Result Date: 08/11/2018 CLINICAL DATA:  Status post gastric catheter placement EXAM: ABDOMEN - 1 VIEW COMPARISON:  Film from earlier in the same day. FINDINGS: Scattered large and small bowel gas with small-bowel dilatation is again seen. Gastric catheter is noted within the stomach. IMPRESSION: Gastric catheter in the stomach. Electronically Signed   By: Inez Catalina  M.D.   On: 08/11/2018 10:15   Ct L-spine No Charge  Result Date: 08/03/2018 CLINICAL DATA:  Abdominal pain.  Fall with back pain. EXAM: CT LUMBAR SPINE WITHOUT CONTRAST TECHNIQUE: Multidetector CT imaging of the lumbar spine was performed without intravenous contrast administration. Multiplanar CT image reconstructions were also generated. COMPARISON:  None. FINDINGS: Segmentation: 5 lumbar type vertebral bodies. Alignment: Straightening of the normal lumbar lordosis. Vertebrae: No fracture or primary bone lesion. Paraspinal and other soft tissues: See results of abdominal CT. Disc levels: No significant disc space finding L3-4 or above. Minimal disc bulges. Ordinary facet osteoarthritis. No compressive canal or foraminal narrowing. L4-5: Moderate bulging of the disc. Mild facet degeneration. Mild stenosis the lateral recesses and foramina. L5-S1: Disc degeneration with disc space narrowing. Chronic endplate osteophytes more prominent  towards the right. Facet osteoarthritis. Narrowing of the subarticular lateral recess on the right and the intervertebral foramen on the right that could possibly be associated with right-sided nerve compression. Solid bridging osteophytes of the sacroiliac joints. IMPRESSION: No acute or traumatic lumbar finding. Degenerative disc disease and degenerative facet disease at L4-5. Mild stenosis of the lateral recesses and foramina at L4-5. Right subarticular lateral recess and foraminal narrowing at L5-S1 Chronic bridging sacroiliac osteophytes. Electronically Signed   By: Nelson Chimes M.D.   On: 08/03/2018 20:44   Dg Abd 2 Views  Result Date: 08/06/2018 CLINICAL DATA:  Small-bowel obstruction. EXAM: ABDOMEN - 2 VIEW COMPARISON:  08/05/2018 and earlier exams. FINDINGS: There is persistent dilation of small bowel with air-fluid levels on the erect view. No free air. No colonic dilation. Bowel anastomosis staples are again noted in the left mid abdomen. Nasogastric tube is stable within the proximal stomach. IMPRESSION: 1. Persistent small bowel obstruction. No change from the previous day's study. Electronically Signed   By: Lajean Manes M.D.   On: 08/06/2018 06:03   Dg Abd Acute 2+v W 1v Chest  Result Date: 08/07/2018 CLINICAL DATA:  83 year old male with small bowel obstruction EXAM: DG ABDOMEN ACUTE W/ 1V CHEST COMPARISON:  Prior acute abdominal series 08/06/2018 FINDINGS: Gastric tube remains in stable position. The catheter tip overlies the gastric fundus. Persistent elevation of the right hemidiaphragm. Trace atherosclerotic calcifications in the transverse aorta. The lungs are clear. Persistent gaseous distention of multiple loops of small bowel with a maximal diameter of 7 cm. Surgical clips are present in the left hemiabdomen suggesting prior bowel surgery. Minimal gas visualized in the sigmoid colon and rectum. Persistent mild thickening of the plica consistent with submucosal edema. IMPRESSION: 1.  Persistent small bowel obstruction without interval progression or improvement. 2. Gastric tube remains in good position. Electronically Signed   By: Jacqulynn Cadet M.D.   On: 08/07/2018 08:20   Dg Abd Portable 1v  Result Date: 08/14/2018 CLINICAL DATA:  Ileus EXAM: PORTABLE ABDOMEN - 1 VIEW COMPARISON:  08/11/2018 FINDINGS: NG tube coils in the fundus of the stomach. Mild gaseous distention of bowel, improved since prior study, likely improving ileus. No organomegaly or free air. IMPRESSION: Mild gaseous distention of both large and small bowel, improving since prior study, favor improving ileus. Electronically Signed   By: Rolm Baptise M.D.   On: 08/14/2018 11:15   Dg Abd Portable 1v  Result Date: 08/11/2018 CLINICAL DATA:  Abdominal distension EXAM: PORTABLE ABDOMEN - 1 VIEW COMPARISON:  08/08/2018 FINDINGS: Scattered large and small bowel gas is noted. The degree of small-bowel dilatation is relatively stable although increase in the degree of colonic gas is seen  likely related to partial small bowel obstruction. No free air is seen. IMPRESSION: Stable small bowel dilatation although increase in the degree of colonic gas is noted likely related to partial small bowel obstruction. Electronically Signed   By: Inez Catalina M.D.   On: 08/11/2018 10:14   Dg Abd Portable 1v  Result Date: 08/08/2018 CLINICAL DATA:  Abdominal distension.  Small bowel dilatation. EXAM: PORTABLE ABDOMEN - 1 VIEW COMPARISON:  July 08, 2018 FINDINGS: Small bowel dilatation is similar in the interval. No other interval changes. IMPRESSION: Continued small bowel obstruction without significant change. Electronically Signed   By: Dorise Bullion III M.D   On: 08/08/2018 08:15   Dg Abd Portable 1v  Result Date: 08/05/2018 CLINICAL DATA:  Follow-up small bowel obstruction. EXAM: PORTABLE ABDOMEN - 1 VIEW COMPARISON:  08/04/2018. FINDINGS: There is significant small bowel dilation, without change from the previous day's study.  Bowel anastomosis staples are noted in the left mid abdomen. Nasogastric tube passes below the diaphragm into the proximal stomach. IMPRESSION: 1. Persistent dilated small bowel consistent with a small-bowel obstruction, unchanged when compared to the previous day's exam. Electronically Signed   By: Lajean Manes M.D.   On: 08/05/2018 10:45   Dg Abd Portable 1v  Result Date: 08/04/2018 CLINICAL DATA:  Small-bowel obstruction. Nasogastric tube placement. EXAM: PORTABLE ABDOMEN - 1 VIEW COMPARISON:  Plain film of the abdomen from earlier same day. FINDINGS: Nasogastric tube passes just below the LEFT hemidiaphragm. Proximal side holes are above the level of the LEFT hemidiaphragm. Distended gas-filled loops of small bowel throughout the abdomen, compatible with the given history of small-bowel obstruction. Elevation of the RIGHT hemidiaphragm. No evidence of free intraperitoneal air. IMPRESSION: 1. Nasogastric tube passes just below the LEFT hemidiaphragm. Proximal side holes are above the level of the LEFT hemidiaphragm. As such, recommend advancing the nasogastric tube approximately 10-15 cm for more optimal radiographic positioning. 2. Distended gas-filled loops of small bowel throughout the abdomen, compatible with the given history of small-bowel obstruction. Electronically Signed   By: Franki Cabot M.D.   On: 08/04/2018 15:46   Dg Abd Portable 1v-small Bowel Obstruction Protocol-initial, 8 Hr Delay  Result Date: 08/04/2018 CLINICAL DATA:  Small bowel obstruction. EXAM: PORTABLE ABDOMEN - 1 VIEW COMPARISON:  Radiograph August 23, 2018. FINDINGS: Nasogastric tube is no longer visualized. Stable small bowel dilatation is noted concerning for distal small bowel obstruction. No definite colonic dilatation is noted. No definite contrast is visualized. IMPRESSION: Small bowel dilatation is noted concerning for distal small bowel obstruction. Nasogastric tube is no longer visualized. Electronically Signed   By:  Marijo Conception M.D.   On: 08/04/2018 10:49   Dg Abd Portable 1v-small Bowel Protocol-position Verification  Result Date: 08/04/2018 CLINICAL DATA:  Check NG tube placement EXAM: PORTABLE ABDOMEN - 1 VIEW COMPARISON:  None. FINDINGS: Scattered large and small bowel gas is noted. Diffuse small bowel dilatation is noted similar to that seen on recent CT examination. Gastric catheter is been placed with the tip in the stomach. Proximal side port lies in the distal esophagus. This should be advanced several cm further into the stomach. IMPRESSION: Nasogastric catheter with tip in stomach. This should be advanced several cm to allow the proximal side port to lie within the stomach as well. Persistent small bowel dilatation. Electronically Signed   By: Inez Catalina M.D.   On: 08/04/2018 00:00   [place] Ekg Site Rite  Result Date: 08/08/2018 If Site Rite image not attached, placement could  not be confirmed due to current cardiac rhythm.   Microbiology: Recent Results (from the past 240 hour(s))  Surgical pcr screen     Status: None   Collection Time: 08/08/18 11:36 AM   Specimen: Nasal Mucosa; Nasal Swab  Result Value Ref Range Status   MRSA, PCR NEGATIVE NEGATIVE Final   Staphylococcus aureus NEGATIVE NEGATIVE Final    Comment: (NOTE) The Xpert SA Assay (FDA approved for NASAL specimens in patients 74 years of age and older), is one component of a comprehensive surveillance program. It is not intended to diagnose infection nor to guide or monitor treatment. Performed at [facility], [place] [address], [place], [place] [address]      Labs: Basic Metabolic Panel: Recent Labs  Lab 08/13/18 0310 08/14/18 0332 08/15/18 0444 08/16/18 0349 08/17/18 0321  NA 150* 150* 143 138 134*  K 3.5 3.7 3.9 4.2 4.3  CL 103 110 110 111 110  CO2 34* 30 23 19* 17*  GLUCOSE 210* 143* 133* 167* 129*  BUN 56* 47* 46* 42* 41*  CREATININE 2.66* 2.27* 2.20* 1.88* 1.83*  CALCIUM 8.8* 8.8* 8.3* 8.0* 7.8*  MG  2.3 1.9 1.9 1.8 1.9  PHOS 3.8 3.8 4.0 3.8 3.2   Liver Function Tests: Recent Labs  Lab 08/11/18 0407 08/12/18 0409 08/14/18 0332 08/15/18 0444 08/17/18 0321  AST 18 17 28 29  42*  ALT 17 15 23 27  46*  ALKPHOS 57 51 87 79 80  BILITOT 0.4 0.6 0.5 0.5 0.4  PROT 6.1* 5.7* 6.1* 6.0* 5.8*  ALBUMIN 2.7* 2.3* 2.3* 2.3* 2.2*   No results for input(s): LIPASE, AMYLASE in the last 168 hours. No results for input(s): AMMONIA in the last 168 hours. CBC: Recent Labs  Lab 08/11/18 0407 08/12/18 0409 08/13/18 1312 08/14/18 0332 08/15/18 0444  WBC 12.7* 13.8* 13.3* 13.5* 13.5*  NEUTROABS 10.8* 11.1*  --  10.4* 10.3*  HGB 7.7* 7.0* 8.3* 8.3* 7.9*  HCT 21.9* 20.1* 24.0* 24.5* 23.0*  MCV 80.8 82.7 83.6 84.2 83.6  PLT 189 198 222 266 299   Cardiac Enzymes: No results for input(s): CKTOTAL, CKMB, CKMBINDEX, TROPONINI in the last 168 hours. BNP: BNP (last 3 results) No results for input(s): BNP in the last 8760 hours.  ProBNP (last 3 results) No results for input(s): PROBNP in the last 8760 hours.  CBG: Recent Labs  Lab 08/16/18 1646 08/16/18 2031 08/17/18 0015 08/17/18 0431 08/17/18 0810  GLUCAP 218* 123* 128* 122* 124*       Signed:  Kayleen Memos, MD [facility] 08/17/2018, 10:30 AM

## 2018-08-17 NOTE — Progress Notes (Signed)
[facility]/Trauma Progress Note  9 Days Post-Op   Assessment/Plan history of colon cancer status post right hemicolectomy history of prostate cancer diabetes mellitus type 2 Hypertension chronic kidney disease stage III  SBO -S/P LOA, enterorrhaphy, Dr. Kieth Brightly, 07/14 -ileus appears to be resolved - continue wet to dry dressing changes -POC,daughter, Daniel Odom ([facility] in [place])  - okay for discharge from our standpoint if pt tolerates soft diet  TKP:TWSF diet VTE: SCD's,heparin KC:LEXN Foley:Dc foley07/15 Follow up:Dr. Kinsinger   LOS: 14 days    Subjective: CC: no complaints   Pt has no abdominal pain, he is having flatus and BM's and he is tolerating his FLD.   Objective: Vital signs in last 24 hours: Temp:  [98.9 F (37.2 C)-99.2 F (37.3 C)] 98.9 F (37.2 C) (07/23 0437) Pulse Rate:  [80-81] 81 (07/23 0437) Resp:  [17-18] 18 (07/23 0437) BP: (129-141)/(58-67) 131/58 (07/23 0437) SpO2:  [100 %] 100 % (07/23 0437) Weight:  [119.7 kg] 119.7 kg (07/23 0500) Last BM Date: 08/16/18  Intake/Output from previous day: 07/22 0701 - 07/23 0700 In: 3605 [P.O.:222; I.V.:3333; IV Piggyback:50] Out: 2050 [Urine:2050] Intake/Output this shift: Total I/O In: -  Out: 525 [Urine:525]  PE: Gen: Alert, pleasant, cooperative Pulm:Rate andeffort normal Abd: Soft,obese,nodistention,+BS,midline with good base of granulation tissue. No purulent drainage noted. no TTP, no peritonitis Skin: no rashes noted, warm and dry   Anti-infectives: Anti-infectives (From admission, onward)   Start     Dose/Rate Route Frequency Ordered Stop   08/08/18 2200  gentamicin (GARAMYCIN) 180 mg in dextrose 5 % 50 mL IVPB     1.5 mg/kg  121.3 kg 109 mL/hr over 30 Minutes Intravenous  Once 08/08/18 1621 08/09/18 0119   08/08/18 2000  clindamycin (CLEOCIN) IVPB 600 mg     600 mg 100 mL/hr over 30 Minutes Intravenous  Once 08/08/18 1621 08/08/18 2020    08/08/18 0945  clindamycin (CLEOCIN) IVPB 600 mg     600 mg 100 mL/hr over 30 Minutes Intravenous On call to O.R. 08/08/18 0936 08/08/18 1410   08/08/18 0945  gentamicin (GARAMYCIN) 180 mg in dextrose 5 % 50 mL IVPB     1.5 mg/kg  121.3 kg 109 mL/hr over 30 Minutes Intravenous On call to O.R. 08/08/18 0936 08/08/18 1445      Lab Results:  Recent Labs    08/15/18 0444  WBC 13.5*  HGB 7.9*  HCT 23.0*  PLT 299   BMET Recent Labs    08/16/18 0349 08/17/18 0321  NA 138 134*  K 4.2 4.3  CL 111 110  CO2 19* 17*  GLUCOSE 167* 129*  BUN 42* 41*  CREATININE 1.88* 1.83*  CALCIUM 8.0* 7.8*   PT/INR No results for input(s): LABPROT, INR in the last 72 hours. CMP     Component Value Date/Time   NA 134 (L) 08/17/2018 0321   K 4.3 08/17/2018 0321   CL 110 08/17/2018 0321   CO2 17 (L) 08/17/2018 0321   GLUCOSE 129 (H) 08/17/2018 0321   BUN 41 (H) 08/17/2018 0321   CREATININE 1.83 (H) 08/17/2018 0321   CALCIUM 7.8 (L) 08/17/2018 0321   PROT 5.8 (L) 08/17/2018 0321   ALBUMIN 2.2 (L) 08/17/2018 0321   AST 42 (H) 08/17/2018 0321   ALT 46 (H) 08/17/2018 0321   ALKPHOS 80 08/17/2018 0321   BILITOT 0.4 08/17/2018 0321   GFRNONAA 34 (L) 08/17/2018 0321   GFRAA 39 (L) 08/17/2018 0321   Lipase  Component Value Date/Time   LIPASE 31 08/03/2018 1445    Studies/Results: No results found.    Kalman Drape , [facility] 08/17/2018, 9:02 AM  Pager: [phone] Mon-Wed, Friday 7:00am-4:30pm Thurs 7am-11:30am  Consults: [phone]

## 2018-08-17 NOTE — Progress Notes (Signed)
Physical Therapy Treatment Patient Details Name: Daniel Odom MRN: [id number] DOB: March 15, 1935 Today's Date: 08/17/2018    History of Present Illness 83 y/o male admitted secondary to abdominal pain and found to have an SBO s/p lysis of adhesions on 08/08/18. Pt also developed post op ileus. PMH including but not limited to HTN and DM.    PT Comments    Pt was seen for mobility to walk on stairs, then to walk to bed for his recovery after fatigue of the effort.  He is motivated to work, in less pain and feeling better in general today.  He has accomplished the effort needed to get home, and will expect him to continue therapy with HHPT for strength, balance and endurance.  Follow acutely until dc is done, to focus on the same goals.   Follow Up Recommendations  Home health PT;Supervision for mobility/OOB     Equipment Recommendations  None recommended by PT    Recommendations for Other Services       Precautions / Restrictions Precautions Precautions: Fall Precaution Comments: catheter, PICC line Restrictions Weight Bearing Restrictions: No    Mobility  Bed Mobility               General bed mobility comments: up in chair when PT arrived  Transfers Overall transfer level: Needs assistance Equipment used: Rolling walker (2 wheeled) Transfers: Sit to/from Stand Sit to Stand: Min assist         General transfer comment: min to power up after extended sitting, cues for hand placement  Ambulation/Gait Ambulation/Gait assistance: Min guard Gait Distance (Feet): 7 Feet Assistive device: Rolling walker (2 wheeled) Gait Pattern/deviations: Step-through pattern;Shuffle;Wide base of support Gait velocity: Decreased  Gait velocity interpretation: <1.31 ft/sec, indicative of household ambulator     Stairs Stairs: Yes Stairs assistance: Min guard Stair Management: One rail Right;One rail Left Number of Stairs: 4 General stair comments: pt took one step at a time with  care for lines   Wheelchair Mobility    Modified Rankin (Stroke Patients Only)       Balance Overall balance assessment: Needs assistance Sitting-balance support: Feet supported Sitting balance-Leahy Scale: Good     Standing balance support: Bilateral upper extremity supported;During functional activity Standing balance-Leahy Scale: Poor                              Cognition Arousal/Alertness: Awake/alert Behavior During Therapy: WFL for tasks assessed/performed Overall Cognitive Status: Within Functional Limits for tasks assessed                                        Exercises      General Comments General comments (skin integrity, edema, etc.): states he is going home today, waiting for PT      Pertinent Vitals/Pain Pain Assessment: Faces Faces Pain Scale: Hurts a little bit Pain Location: abdomen Pain Descriptors / Indicators: Operative site guarding Pain Intervention(s): Limited activity within patient's tolerance;Monitored during session;Premedicated before session;Repositioned    Home Living                      Prior Function            PT Goals (current goals can now be found in the care plan section) Acute Rehab PT Goals Patient Stated Goal: to get stronger Progress towards  PT goals: Progressing toward goals    Frequency    Min 3X/week      PT Plan Current plan remains appropriate    Co-evaluation              AM-PAC PT "6 Clicks" Mobility   Outcome Measure  Help needed turning from your back to your side while in a flat bed without using bedrails?: A Little Help needed moving from lying on your back to sitting on the side of a flat bed without using bedrails?: A Little Help needed moving to and from a bed to a chair (including a wheelchair)?: A Little Help needed standing up from a chair using your arms (e.g., wheelchair or bedside chair)?: A Little Help needed to walk in hospital room?: A  Little Help needed climbing 3-5 steps with a railing? : A Little 6 Click Score: 18    End of Session Equipment Utilized During Treatment: Gait belt Activity Tolerance: Patient tolerated treatment well;Patient limited by fatigue;Other (comment)(LE weakness) Patient left: in chair;with call bell/phone within reach Nurse Communication: Mobility status PT Visit Diagnosis: Unsteadiness on feet (R26.81);Muscle weakness (generalized) (M62.81)     Time: 5364-6803 PT Time Calculation (min) (ACUTE ONLY): 27 min  Charges:  $Gait Training: 8-22 mins $Therapeutic Activity: 8-22 mins                     Ramond Dial 08/17/2018, 4:00 PM   Mee Hives, PT MS Acute Rehab Dept. Number: [place] and [place]

## 2018-08-17 NOTE — Progress Notes (Signed)
Discharge instructions reviewed with pt and pt's wife.  Also demonstrated dressing change to pt's wife via Facetime.  Pt's wife verbalized understanding and had no questions.  Pt discharged in stable condition via wheelchair.  Daniel Odom

## 2018-08-17 NOTE — Care Management (Signed)
    Durable Medical Equipment  (From admission, onward)         Start     Ordered   08/17/18 1154  For home use only DME Hospital bed  Once    Question Answer Comment  Length of Need 12 Months   Patient has (list medical condition): weakness, small bowel obstruction   The above medical condition requires: Patient requires the ability to reposition frequently   Head must be elevated greater than: 30 degrees   Bed type Semi-electric   Support Surface: Gel Overlay      08/17/18 1159

## 2018-08-17 NOTE — Discharge Instructions (Signed)
[facility]      [facility] [phone]  OPEN ABDOMINAL SURGERY: POST OP INSTRUCTIONS  Always review your discharge instruction sheet given to you by the facility where your surgery was performed.  IF YOU HAVE DISABILITY OR FAMILY LEAVE FORMS, YOU MUST BRING THEM TO THE OFFICE FOR PROCESSING.  PLEASE DO NOT GIVE THEM TO YOUR DOCTOR.  1. A prescription for pain medication may be given to you upon discharge.  Take your pain medication as prescribed, if needed.  If narcotic pain medicine is not needed, then you may take acetaminophen (Tylenol) or ibuprofen (Advil) as needed. 2. Take your usually prescribed medications unless otherwise directed. 3. If you need a refill on your pain medication, please contact your pharmacy. They will contact our office to request authorization.  Prescriptions will not be filled after 5pm or on week-ends. 4. You should follow a light diet the first few days after arrival home, such as soup and crackers, pudding, etc.unless your doctor has advised otherwise. A high-fiber, low fat diet can be resumed as tolerated.   Be sure to include lots of fluids daily. Most patients will experience some swelling and bruising on the chest and neck area.  Ice packs will help.  Swelling and bruising can take several days to resolve 5. Most patients will experience some swelling and bruising in the area of the incision. Ice pack will help. Swelling and bruising can take several days to resolve..  6. It is common to experience some constipation if taking pain medication after surgery.  Increasing fluid intake and taking a stool softener will usually help or prevent this problem from occurring.  A mild laxative (Milk of Magnesia or Miralax) should be taken according to package directions if there are no bowel movements after 48 hours. 7.  You may have steri-strips (small skin tapes) in place directly over the incision.  These strips should be left on the skin for 7-10 days.  If your  surgeon used skin glue on the incision, you may shower in 24 hours.  The glue will flake off over the next 2-3 weeks.  Any sutures or staples will be removed at the office during your follow-up visit. You may find that a light gauze bandage over your incision may keep your staples from being rubbed or pulled. You may shower and replace the bandage daily. 8. ACTIVITIES:  You may resume regular (light) daily activities beginning the next day--such as daily self-care, walking, climbing stairs--gradually increasing activities as tolerated.  You may have sexual intercourse when it is comfortable.  Refrain from any heavy lifting or straining until approved by your doctor. a. You may drive when you no longer are taking prescription pain medication, you can comfortably wear a seatbelt, and you can safely maneuver your car and apply brakes b. Return to Work: ___________________________________ 9. You should see your doctor in the office for a follow-up appointment approximately two weeks after your surgery.  Make sure that you call for this appointment within a day or two after you arrive home to insure a convenient appointment time. OTHER INSTRUCTIONS:  _____________________________________________________________ _____________________________________________________________  WHEN TO CALL YOUR DOCTOR: 1. Fever over 101.0 2. Inability to urinate 3. Nausea and/or vomiting 4. Extreme swelling or bruising 5. Continued bleeding from incision. 6. Increased pain, redness, or drainage from the incision. 7. Difficulty swallowing or breathing 8. Muscle cramping or spasms. 9. Numbness or tingling in hands or feet or around lips.  The clinic staff is available to   answer your questions during regular business hours.  Please don't hesitate to call and ask to speak to one of the nurses if you have concerns.  For further questions, please visit [email or web address]   

## 2018-08-17 NOTE — TOC Transition Note (Signed)
Transition of Care [facility]) - CM/SW Discharge Note   Patient Details  Name: Daniel Odom MRN: [id number] Date of Birth: 1935-04-21  Transition of Care [facility] CM/SW Contact:  Carles Collet, RN Phone Number: 08/17/2018, 12:02 PM   Clinical Narrative:    Returned call to patient's wife Ruby to discuss DC plan. Ruby is able to provide transport to home. She would like a hospital bed. Dis cussed w Dr Nevada Crane, orders obtained. She asked who will provide bed and we discussed we typically use [employer] and she stated that would be fine and wrote down their name. I let her know they would be calling later today to set up time for delivery. She said that would be fine, he has a recliner he can use until the bed gets there. She stated that he also had a RW and would be staying on their first level with close access to a bathroom.  She stated that [facility] had already been in contact to set up home visits and they discussed wound care.  Wife had questions about DC time etc and CM deferred that to nurse. Encouraged wife to use facetime with nurse to discuss wound care when they go over DC instructions. Discussed this with assigned nurse Ria Comment.      Final next level of care: [place] Barriers to Discharge: No Barriers Identified   Patient Goals and CMS Choice Patient states their goals for this hospitalization and ongoing recovery are:: to go home CMS Medicare.gov Compare Post Acute Care list provided to:: Patient Choice offered to / list presented to : Patient  Discharge Placement                       Discharge Plan and Services   Discharge Planning Services: CM Consult Post Acute Care Choice: Home Health          DME Arranged: Hospital bed DME Agency: [employer] Date DME Agency Contacted: 08/17/18 Time DME Agency Contacted: [id number] Representative spoke with at DME Agency: zack HH Arranged: RN, PT [place] Agency: [place] Date [place]: 08/17/18 Time [place]: 1202 Representative spoke with at [place]: [place] ([place]) Interventions     Readmission Risk Interventions No flowsheet data found.

## 2019-05-28 ENCOUNTER — Emergency Department (HOSPITAL_COMMUNITY): Payer: No Typology Code available for payment source

## 2019-05-28 ENCOUNTER — Other Ambulatory Visit: Payer: Self-pay

## 2019-05-28 ENCOUNTER — Emergency Department (HOSPITAL_COMMUNITY)
Admission: EM | Admit: 2019-05-28 | Discharge: 2019-05-28 | Discharge status: Against Medical Advice | Payer: No Typology Code available for payment source | Attending: Emergency Medicine | Admitting: Emergency Medicine

## 2019-05-28 ENCOUNTER — Encounter (HOSPITAL_COMMUNITY): Payer: Self-pay

## 2019-05-28 DIAGNOSIS — M79652 Pain in left thigh: Secondary | ICD-10-CM | POA: Diagnosis present

## 2019-05-28 DIAGNOSIS — I129 Hypertensive chronic kidney disease with stage 1 through stage 4 chronic kidney disease, or unspecified chronic kidney disease: Secondary | ICD-10-CM | POA: Insufficient documentation

## 2019-05-28 DIAGNOSIS — Z794 Long term (current) use of insulin: Secondary | ICD-10-CM | POA: Insufficient documentation

## 2019-05-28 DIAGNOSIS — R441 Visual hallucinations: Secondary | ICD-10-CM | POA: Diagnosis not present

## 2019-05-28 DIAGNOSIS — Z5320 Procedure and treatment not carried out because of patient's decision for unspecified reasons: Secondary | ICD-10-CM | POA: Insufficient documentation

## 2019-05-28 DIAGNOSIS — R531 Weakness: Secondary | ICD-10-CM | POA: Diagnosis not present

## 2019-05-28 DIAGNOSIS — E1122 Type 2 diabetes mellitus with diabetic chronic kidney disease: Secondary | ICD-10-CM | POA: Diagnosis not present

## 2019-05-28 DIAGNOSIS — R079 Chest pain, unspecified: Secondary | ICD-10-CM | POA: Insufficient documentation

## 2019-05-28 DIAGNOSIS — R0602 Shortness of breath: Secondary | ICD-10-CM | POA: Diagnosis not present

## 2019-05-28 DIAGNOSIS — Z8546 Personal history of malignant neoplasm of prostate: Secondary | ICD-10-CM | POA: Diagnosis not present

## 2019-05-28 DIAGNOSIS — Z85038 Personal history of other malignant neoplasm of large intestine: Secondary | ICD-10-CM | POA: Diagnosis not present

## 2019-05-28 DIAGNOSIS — G459 Transient cerebral ischemic attack, unspecified: Secondary | ICD-10-CM | POA: Insufficient documentation

## 2019-05-28 DIAGNOSIS — M79605 Pain in left leg: Secondary | ICD-10-CM

## 2019-05-28 DIAGNOSIS — N183 Chronic kidney disease, stage 3 unspecified: Secondary | ICD-10-CM | POA: Diagnosis not present

## 2019-05-28 LAB — BASIC METABOLIC PANEL
Anion gap: 11 (ref 5–15)
BUN: 34 mg/dL — ABNORMAL HIGH (ref 8–23)
CO2: 24 mmol/L (ref 22–32)
Calcium: 9.6 mg/dL (ref 8.9–10.3)
Chloride: 104 mmol/L (ref 98–111)
Creatinine, Ser: 2.11 mg/dL — ABNORMAL HIGH (ref 0.61–1.24)
GFR calc Af Amer: 33 mL/min — ABNORMAL LOW (ref >60)
GFR calc non Af Amer: 28 mL/min — ABNORMAL LOW (ref >60)
Glucose, Bld: 155 mg/dL — ABNORMAL HIGH (ref 70–99)
Potassium: 4.4 mmol/L (ref 3.5–5.1)
Sodium: 139 mmol/L (ref 135–145)

## 2019-05-28 LAB — CBC
HCT: 30.3 % — ABNORMAL LOW (ref 39.0–52.0)
Hemoglobin: 10.8 g/dL — ABNORMAL LOW (ref 13.0–17.0)
MCH: 28.8 pg (ref 26.0–34.0)
MCHC: 35.6 g/dL (ref 30.0–36.0)
MCV: 80.8 fL (ref 80.0–100.0)
Platelets: 278 10*3/uL (ref 150–400)
RBC: 3.75 MIL/uL — ABNORMAL LOW (ref 4.22–5.81)
RDW: 14.7 % (ref 11.5–15.5)
WBC: 5.1 10*3/uL (ref 4.0–10.5)
nRBC: 0 % (ref 0.0–0.2)

## 2019-05-28 LAB — TROPONIN I (HIGH SENSITIVITY)
Troponin I (High Sensitivity): 19 ng/L — ABNORMAL HIGH (ref <18)
Troponin I (High Sensitivity): 15 ng/L (ref <18)

## 2019-05-28 NOTE — ED Triage Notes (Signed)
Pt reports left leg pain from his knee up to his buttocks and some chest pain for the past week. Pt also reports hallucinations while sleeping but not when he is awake. Pt a.o, nad noted.

## 2019-05-28 NOTE — ED Provider Notes (Signed)
[place] EMERGENCY DEPARTMENT Provider Note   CSN: [id number] Arrival date & time: 05/28/19  1108     History Chief Complaint  Patient presents with  . Chest Pain  . Leg Pain    Daniel Odom is a 84 y.o. male.  HPI HPI Comments: Daniel Odom is a 84 y.o. male with a history of CKD 3, colon cancer, diabetes mellitus, hypertension, prostate cancer who presents to the Emergency Department with multiple complaints.  Last week patient states he was experiencing left thigh pain that has been gradually worsening and radiates up his left buttock.  His pain worsens with movement.  He reports left leg weakness since this started to occur.  He denies numbness, tingling, abdominal pain, nausea, vomiting, diarrhea, constipation.  He additionally reports intermittent chest pain.  He states that his central and denies any modifying factors.  He reports associated shortness of breath.  He states he recently wore a Holter monitor but is unsure of the results.  He states that he has a stress test scheduled in 4 days.  He denies fevers, URI symptoms, dysuria, hematuria, decreased urination, syncope.  Lastly, he states he has been "hallucinating".  He states sometimes he will close his eyes and see things that are not there.  His wife is at bedside and cannot seem to better articulate what he means by this.  She does state that he is behaving at baseline.  She states he ambulates at home on his own and has been doing this for the past week.  He typically sits in a chair close to the restroom most days and will not leave this general area.  He does go outside from time to time.  She does note that he ambulates unassisted during all this.  Patient and his wife are generally poor historians and have difficulty clearly providing details of his complaints.    Past Medical History:  Diagnosis Date  . Chronic kidney disease (CKD), stage III (moderate)   . Colon cancer ([place])   . Diabetes  mellitus without complication ([place])   . Hypertension   . Prostate cancer [facility])     Patient Active Problem List   Diagnosis Date Noted  . Mobitz type 1 second degree atrioventricular block 08/05/2018  . Morbid obesity with body mass index of 40.0-49.9 ([place]) 08/05/2018  . Hypertension   . Diabetes mellitus without complication ([place])   . SBO (small bowel obstruction) ([place]) 08/03/2018  . History of colon cancer 08/03/2018  . Diabetes mellitus type 2 in obese ([place]) 08/03/2018  . Essential hypertension 08/03/2018  . Anemia associated with chronic renal failure 08/03/2018  . CKD (chronic kidney disease) stage 3, GFR 30-59 ml/min 02/07/2017  . OSA on CPAP 02/07/2017  . Tremor, hereditary, benign 02/07/2017  . Asthma 02/07/2017  . 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 Prostate cancer ([place]) 08/08/2016    Past Surgical History:  Procedure Laterality Date  . COLON SURGERY    . LAPAROSCOPIC LYSIS OF ADHESIONS N/A 08/08/2018   Procedure: LAPAROSCOPIC LYSIS OF ADHESIONS;  Surgeon: Kinsinger, Arta Bruce, MD;  Location: [place];  Service: General;  Laterality: N/A;  . LAPAROTOMY N/A 08/08/2018   Procedure: Exploratory Laparotomy;  Surgeon: Kieth Brightly Arta Bruce, MD;  Location: [place];  Service: General;  Laterality: N/A;  . LYSIS OF ADHESION N/A 08/08/2018   Procedure: Lysis of adhesions with repair of small intestine;  Surgeon: Kieth Brightly Arta Bruce, MD;  Location: [place];  Service: General;  Laterality: N/A;       Family History  Problem Relation Age of Onset  . Diabetes Mellitus II Brother   . Colon cancer Neg Hx     Social History   Tobacco Use  . Smoking status: Never Smoker  . Smokeless tobacco: Never Used  Substance Use Topics  . Alcohol use: Not on file  . Drug use: Not on file    Home Medications Prior to Admission medications   Medication Sig Start Date End Date Taking? Authorizing Provider  Albuterol Sulfate 108 (90 Base) MCG/ACT AEPB Inhale 2  puffs into the lungs every 6 (six) hours as needed for wheezing.    [provider]  atorvastatin (LIPITOR) 80 MG tablet Take 80 mg by mouth at bedtime.    [provider]  CALCIUM PO Take 1 tablet by mouth 2 (two) times a day.    [provider]  Carboxymethylcellulose Sod PF (REFRESH PLUS) 0.5 % SOLN Place 1 drop into both eyes 2 (two) times daily as needed (dry eyes).     [provider]  Cholecalciferol (VITAMIN D-1000 MAX ST) 25 MCG (1000 UT) tablet Take 1,000 Units by mouth daily.    [provider]  cyclobenzaprine (FLEXERIL) 10 MG tablet Take 10 mg by mouth 3 (three) times daily as needed for muscle spasms.    [provider]  fluticasone (FLONASE) 50 MCG/ACT nasal spray Place 1 spray into both nostrils daily as needed for allergies or rhinitis.    [provider]  insulin NPH-regular Human (NOVOLIN 70/30) (70-30) 100 UNIT/ML injection Inject 7-30 Units into the skin See admin instructions. 7u in the AM and 30 in the evening    [provider]  mometasone (ASMANEX) 220 MCG/INH inhaler Inhale 2 puffs into the lungs at bedtime.    [provider]  polyethylene glycol (MIRALAX / GLYCOLAX) 17 g packet Take 17 g by mouth 2 (two) times a day. 02/16/17   [provider]  primidone (MYSOLINE) 50 MG tablet Take 100-150 mg by mouth See admin instructions. 100mg  in the AM and 150mg  PM    [provider]    Allergies    Penicillins  Review of Systems   Review of Systems  All other systems reviewed and are negative. Ten systems reviewed and are negative for acute change, except as noted in the HPI.   Physical Exam Updated Vital Signs BP (!) 145/76 (BP Location: Left Arm)   Pulse 97   Temp 98.2 F (36.8 C) (Oral)   Resp 16   Ht 5\' 7"  (1.702 m)   Wt 122.5 kg   SpO2 100%   BMI 42.29 kg/m   Physical Exam Vitals and nursing note reviewed.  Constitutional:      General: He is not in acute  distress.    Appearance: He is well-developed. He is obese. He is not ill-appearing, toxic-appearing or diaphoretic.     Comments: Elderly obese African-American male.  He is lying in the semi-Fowlers position.  He answers questions coherently but does ramble and has difficulty staying on topic.  His wife is at bedside and states he is at baseline.  HENT:     Head: Normocephalic and atraumatic.  Eyes:     Extraocular Movements: Extraocular movements intact.     Pupils: Pupils are equal, round, and reactive to light.  Cardiovascular:     Rate and Rhythm: Normal rate and regular rhythm.     Pulses:  Radial pulses are 2+ on the right side and 2+ on the left side.       Posterior tibial pulses are 1+ on the right side and 1+ on the left side.     Heart sounds: Normal heart sounds.  Pulmonary:     Effort: Pulmonary effort is normal.     Breath sounds: Normal breath sounds. No decreased breath sounds, wheezing, rhonchi or rales.  Abdominal:     General: Bowel sounds are normal.     Palpations: Abdomen is soft.     Tenderness: There is no abdominal tenderness.     Comments: Protuberant abdomen.  Vertical linear scar that is well healed.  Difficult to assess abdomen due to body habitus.  Abdomen is nontender in all 4 quadrants with deep palpation.  Musculoskeletal:     Cervical back: Normal range of motion and neck supple.     Right lower leg: No tenderness. No edema.     Left lower leg: No tenderness. No edema.     Comments: No palpable pain noted in the left posterior thigh, glutes, lumbar spine.  Mild pain noted in the left posterior thigh with flexion of the left lower extremity.  No radiation of pain noted down the left lower extremity.  Full passive range of motion of the bilateral lower extremities at the hips and knees.  Skin:    General: Skin is warm and dry.  Neurological:     Mental Status: He is alert and oriented to person, place, and time.     Comments: Patient is  oriented to person, place, time.  He answers questions clearly but does ramble.  Distal sensation is intact.  Strength is 5 out of 5 with plantar and dorsiflexion of the feet.  Grip strength 5 out of 5.  Bilateral upper extremity strength 5 out of 5.  Palpable radial and pedal pulses.  Negative pronator drift.  Psychiatric:        Mood and Affect: Mood normal.        Behavior: Behavior normal.     Comments: Mood and behavior is normal per his wife.    ED Results / Procedures / Treatments   Labs (all labs ordered are listed, but only abnormal results are displayed) Labs Reviewed  BASIC METABOLIC PANEL - Abnormal; Notable for the following components:      Result Value   Glucose, Bld 155 (*)    BUN 34 (*)    Creatinine, Ser 2.11 (*)    GFR calc non Af Amer 28 (*)    GFR calc Af Amer 33 (*)    All other components within normal limits  CBC - Abnormal; Notable for the following components:   RBC 3.75 (*)    Hemoglobin 10.8 (*)    HCT 30.3 (*)    All other components within normal limits  TROPONIN I (HIGH SENSITIVITY) - Abnormal; Notable for the following components:   Troponin I (High Sensitivity) 19 (*)    All other components within normal limits  TROPONIN I (HIGH SENSITIVITY)   EKG None  Radiology DG Chest 2 View  Result Date: 05/28/2019 CLINICAL DATA:  Chest pain. EXAM: CHEST - 2 VIEW COMPARISON:  None. FINDINGS: The heart size and mediastinal contours are within normal limits. Both lungs are clear. No pneumothorax or pleural effusion is noted. The visualized skeletal structures are unremarkable. IMPRESSION: No active cardiopulmonary disease. Electronically Signed   By: Marijo Conception M.D.   On: 05/28/2019 11:58  Procedures Procedures   Medications Ordered in ED Medications - No data to display  ED Course  I have reviewed the triage vital signs and the nursing notes.  Pertinent labs & imaging results that were available during my care of the patient were reviewed by me  and considered in my medical decision making (see chart for details).    MDM Rules/Calculators/A&P                      3:10 PM patient is an elderly 84 year old African-American male that presents with multiple complaints including left thigh pain, intermittent chest pain, visual hallucinations.  His thigh pain is elicited with straight leg raise, though does not radiate down his lower extremity.  Nonconcerning for sciatica.  Patient is generally a poor historian and his wife is at bedside and cannot provide much significant detail.  He endorses some intermittent chest pain.  Nonpalpable on exam.  Initial labs show a troponin of 19.  Patient states he is also hallucinating.  He does note that he will close his eyes and feel like he is "seeing things".  His wife states he is behaving at baseline.  She states he has been behaving at his baseline since his symptoms began.  Will obtain basic labs as well as repeat troponins.  Will reassess.  7:30 PM second troponin was negative.  I discussed this patient with my attending physician Dr. Gareth Morgan who additionally evaluated the patient.  She recommended a CT of the head without contrast which she discussed with the patient.  He felt that he was "feeling better" and decided to leave AMA at this time.  The risk of this decision were discussed with the patient and his wife and he verbalized understanding of this and chose to leave without further evaluation.  Final Clinical Impression(s) / ED Diagnoses Final diagnoses:  TIA (transient ischemic attack)  Chest pain, unspecified type  Left leg pain   Rx / DC Orders ED Discharge Orders    None       Rayna Sexton, PA-C 05/28/19 2118    Pattricia Boss, MD 05/30/19 1210

## 2019-05-30 ENCOUNTER — Emergency Department (HOSPITAL_COMMUNITY): Payer: No Typology Code available for payment source

## 2019-05-30 ENCOUNTER — Observation Stay (HOSPITAL_COMMUNITY)
Admission: EM | Admit: 2019-05-30 | Discharge: 2019-06-01 | Disposition: A | Discharge status: Home / Self Care | Payer: No Typology Code available for payment source | Attending: Internal Medicine | Admitting: Internal Medicine

## 2019-05-30 DIAGNOSIS — Z7982 Long term (current) use of aspirin: Secondary | ICD-10-CM | POA: Insufficient documentation

## 2019-05-30 DIAGNOSIS — Z20822 Contact with and (suspected) exposure to covid-19: Secondary | ICD-10-CM | POA: Diagnosis not present

## 2019-05-30 DIAGNOSIS — I129 Hypertensive chronic kidney disease with stage 1 through stage 4 chronic kidney disease, or unspecified chronic kidney disease: Secondary | ICD-10-CM | POA: Insufficient documentation

## 2019-05-30 DIAGNOSIS — G459 Transient cerebral ischemic attack, unspecified: Secondary | ICD-10-CM

## 2019-05-30 DIAGNOSIS — I639 Cerebral infarction, unspecified: Secondary | ICD-10-CM

## 2019-05-30 DIAGNOSIS — R531 Weakness: Secondary | ICD-10-CM

## 2019-05-30 DIAGNOSIS — Z79899 Other long term (current) drug therapy: Secondary | ICD-10-CM | POA: Diagnosis not present

## 2019-05-30 DIAGNOSIS — E1122 Type 2 diabetes mellitus with diabetic chronic kidney disease: Secondary | ICD-10-CM | POA: Diagnosis not present

## 2019-05-30 DIAGNOSIS — I10 Essential (primary) hypertension: Secondary | ICD-10-CM | POA: Diagnosis present

## 2019-05-30 DIAGNOSIS — D631 Anemia in chronic kidney disease: Secondary | ICD-10-CM | POA: Insufficient documentation

## 2019-05-30 DIAGNOSIS — Z8546 Personal history of malignant neoplasm of prostate: Secondary | ICD-10-CM | POA: Insufficient documentation

## 2019-05-30 DIAGNOSIS — Z85038 Personal history of other malignant neoplasm of large intestine: Secondary | ICD-10-CM | POA: Insufficient documentation

## 2019-05-30 DIAGNOSIS — Z794 Long term (current) use of insulin: Secondary | ICD-10-CM | POA: Diagnosis not present

## 2019-05-30 DIAGNOSIS — Z888 Allergy status to other drugs, medicaments and biological substances status: Secondary | ICD-10-CM | POA: Diagnosis not present

## 2019-05-30 DIAGNOSIS — Z5309 Procedure and treatment not carried out because of other contraindication: Secondary | ICD-10-CM

## 2019-05-30 DIAGNOSIS — E669 Obesity, unspecified: Secondary | ICD-10-CM | POA: Diagnosis present

## 2019-05-30 DIAGNOSIS — N183 Chronic kidney disease, stage 3 unspecified: Secondary | ICD-10-CM | POA: Insufficient documentation

## 2019-05-30 DIAGNOSIS — Z6841 Body Mass Index (BMI) 40.0 and over, adult: Secondary | ICD-10-CM | POA: Insufficient documentation

## 2019-05-30 DIAGNOSIS — N1832 Chronic kidney disease, stage 3b: Secondary | ICD-10-CM | POA: Diagnosis present

## 2019-05-30 DIAGNOSIS — M6281 Muscle weakness (generalized): Secondary | ICD-10-CM | POA: Diagnosis present

## 2019-05-30 DIAGNOSIS — N189 Chronic kidney disease, unspecified: Secondary | ICD-10-CM | POA: Diagnosis present

## 2019-05-30 DIAGNOSIS — E1169 Type 2 diabetes mellitus with other specified complication: Secondary | ICD-10-CM | POA: Diagnosis present

## 2019-05-30 LAB — I-STAT CHEM 8, ED
BUN: 36 mg/dL — ABNORMAL HIGH (ref 8–23)
Calcium, Ion: 1.18 mmol/L (ref 1.15–1.40)
Chloride: 105 mmol/L (ref 98–111)
Creatinine, Ser: 2.1 mg/dL — ABNORMAL HIGH (ref 0.61–1.24)
Glucose, Bld: 137 mg/dL — ABNORMAL HIGH (ref 70–99)
HCT: 29 % — ABNORMAL LOW (ref 39.0–52.0)
Hemoglobin: 9.9 g/dL — ABNORMAL LOW (ref 13.0–17.0)
Potassium: 3.6 mmol/L (ref 3.5–5.1)
Sodium: 139 mmol/L (ref 135–145)
TCO2: 25 mmol/L (ref 22–32)

## 2019-05-30 LAB — HEMOGLOBIN A1C
Hgb A1c MFr Bld: 7.3 % — ABNORMAL HIGH (ref 4.8–5.6)
Mean Plasma Glucose: 162.81 mg/dL

## 2019-05-30 LAB — COMPREHENSIVE METABOLIC PANEL
ALT: 20 U/L (ref 0–44)
AST: 25 U/L (ref 15–41)
Albumin: 3.7 g/dL (ref 3.5–5.0)
Alkaline Phosphatase: 43 U/L (ref 38–126)
Anion gap: 14 (ref 5–15)
BUN: 38 mg/dL — ABNORMAL HIGH (ref 8–23)
CO2: 23 mmol/L (ref 22–32)
Calcium: 9.5 mg/dL (ref 8.9–10.3)
Chloride: 103 mmol/L (ref 98–111)
Creatinine, Ser: 2.25 mg/dL — ABNORMAL HIGH (ref 0.61–1.24)
GFR calc Af Amer: 31 mL/min — ABNORMAL LOW (ref >60)
GFR calc non Af Amer: 26 mL/min — ABNORMAL LOW (ref >60)
Glucose, Bld: 145 mg/dL — ABNORMAL HIGH (ref 70–99)
Potassium: 3.7 mmol/L (ref 3.5–5.1)
Sodium: 140 mmol/L (ref 135–145)
Total Bilirubin: 0.6 mg/dL (ref 0.3–1.2)
Total Protein: 7.1 g/dL (ref 6.5–8.1)

## 2019-05-30 LAB — DIFFERENTIAL
Abs Immature Granulocytes: 0.02 10*3/uL (ref 0.00–0.07)
Basophils Absolute: 0 10*3/uL (ref 0.0–0.1)
Basophils Relative: 1 %
Eosinophils Absolute: 0.2 10*3/uL (ref 0.0–0.5)
Eosinophils Relative: 4 %
Immature Granulocytes: 0 %
Lymphocytes Relative: 53 %
Lymphs Abs: 2.6 10*3/uL (ref 0.7–4.0)
Monocytes Absolute: 0.5 10*3/uL (ref 0.1–1.0)
Monocytes Relative: 10 %
Neutro Abs: 1.6 10*3/uL — ABNORMAL LOW (ref 1.7–7.7)
Neutrophils Relative %: 32 %

## 2019-05-30 LAB — CBC
HCT: 28.2 % — ABNORMAL LOW (ref 39.0–52.0)
Hemoglobin: 9.8 g/dL — ABNORMAL LOW (ref 13.0–17.0)
MCH: 28.5 pg (ref 26.0–34.0)
MCHC: 34.8 g/dL (ref 30.0–36.0)
MCV: 82 fL (ref 80.0–100.0)
Platelets: 238 10*3/uL (ref 150–400)
RBC: 3.44 MIL/uL — ABNORMAL LOW (ref 4.22–5.81)
RDW: 14.9 % (ref 11.5–15.5)
WBC: 5 10*3/uL (ref 4.0–10.5)
nRBC: 0 % (ref 0.0–0.2)

## 2019-05-30 LAB — APTT: aPTT: 32 seconds (ref 24–36)

## 2019-05-30 LAB — PROTIME-INR
INR: 1.1 (ref 0.8–1.2)
Prothrombin Time: 13.8 seconds (ref 11.4–15.2)

## 2019-05-30 LAB — RESPIRATORY PANEL BY RT PCR (FLU A&B, COVID)
Influenza A by PCR: NEGATIVE
Influenza B by PCR: NEGATIVE
SARS Coronavirus 2 by RT PCR: NEGATIVE

## 2019-05-30 LAB — CBG MONITORING, ED
Glucose-Capillary: 109 mg/dL — ABNORMAL HIGH (ref 70–99)
Glucose-Capillary: 221 mg/dL — ABNORMAL HIGH (ref 70–99)

## 2019-05-30 MED ORDER — ACETAMINOPHEN 325 MG PO TABS: 650.0000 mg | ORAL_TABLET | ORAL | Status: DC | PRN

## 2019-05-30 MED ORDER — PRIMIDONE 50 MG PO TABS
150.0000 mg | ORAL_TABLET | Freq: Every day | ORAL | Status: DC
  Administered 2019-05-31: 150 mg via ORAL
  Filled 2019-05-30 (×2): qty 3

## 2019-05-30 MED ORDER — SODIUM CHLORIDE 0.9% FLUSH
3.0000 mL | Freq: Once | INTRAVENOUS | Status: DC
Start: 2019-05-30 — End: 2019-06-01

## 2019-05-30 MED ORDER — FERROUS SULFATE 325 (65 FE) MG PO TABS
325.0000 mg | ORAL_TABLET | ORAL | Status: DC
  Administered 2019-05-30 – 2019-06-01 (×2): 325 mg via ORAL
  Filled 2019-05-30 (×2): qty 1

## 2019-05-30 MED ORDER — LIDOCAINE 5 % EX PTCH
1.0000 | MEDICATED_PATCH | CUTANEOUS | Status: DC
  Administered 2019-05-30 – 2019-05-31 (×2): 1 via TRANSDERMAL
  Filled 2019-05-30 (×2): qty 1

## 2019-05-30 MED ORDER — PRIMIDONE 50 MG PO TABS
100.0000 mg | ORAL_TABLET | Freq: Every day | ORAL | Status: DC
  Administered 2019-05-31 – 2019-06-01 (×2): 100 mg via ORAL
  Filled 2019-05-30 (×5): qty 2

## 2019-05-30 MED ORDER — INSULIN ASPART 100 UNIT/ML ~~LOC~~ SOLN
0.0000 [IU] | Freq: Three times a day (TID) | SUBCUTANEOUS | Status: DC
  Administered 2019-05-31 (×2): 3 [IU] via SUBCUTANEOUS
  Administered 2019-05-31 – 2019-06-01 (×2): 2 [IU] via SUBCUTANEOUS

## 2019-05-30 MED ORDER — GABAPENTIN 100 MG PO CAPS
100.0000 mg | ORAL_CAPSULE | Freq: Two times a day (BID) | ORAL | Status: DC
  Administered 2019-05-31 – 2019-06-01 (×4): 100 mg via ORAL
  Filled 2019-05-30 (×4): qty 1

## 2019-05-30 MED ORDER — SENNOSIDES-DOCUSATE SODIUM 8.6-50 MG PO TABS: 1.0000 | ORAL_TABLET | Freq: Every evening | ORAL | Status: DC | PRN

## 2019-05-30 MED ORDER — HYDRALAZINE HCL 25 MG PO TABS: 25.0000 mg | ORAL_TABLET | Freq: Four times a day (QID) | ORAL | Status: DC | PRN

## 2019-05-30 MED ORDER — STROKE: EARLY STAGES OF RECOVERY BOOK
Freq: Once | Status: AC
  Filled 2019-05-30: qty 1

## 2019-05-30 MED ORDER — SODIUM CHLORIDE 0.9 % IV SOLN: INTRAVENOUS | Status: AC

## 2019-05-30 MED ORDER — FLUTICASONE PROPIONATE 50 MCG/ACT NA SUSP
1.0000 | Freq: Every day | NASAL | Status: DC | PRN
  Filled 2019-05-30: qty 16

## 2019-05-30 MED ORDER — HEPARIN SODIUM (PORCINE) 5000 UNIT/ML IJ SOLN
5000.0000 [IU] | Freq: Two times a day (BID) | INTRAMUSCULAR | Status: DC
  Administered 2019-05-31 – 2019-06-01 (×4): 5000 [IU] via SUBCUTANEOUS
  Filled 2019-05-30 (×4): qty 1

## 2019-05-30 MED ORDER — VITAMIN D 25 MCG (1000 UNIT) PO TABS
1000.0000 [IU] | ORAL_TABLET | Freq: Every day | ORAL | Status: DC
  Administered 2019-05-30 – 2019-06-01 (×3): 1000 [IU] via ORAL
  Filled 2019-05-30 (×3): qty 1

## 2019-05-30 MED ORDER — POLYETHYLENE GLYCOL 3350 17 G PO PACK: 17.0000 g | PACK | Freq: Every day | ORAL | Status: DC | PRN

## 2019-05-30 MED ORDER — ACETAMINOPHEN 650 MG RE SUPP: 650.0000 mg | RECTAL | Status: DC | PRN

## 2019-05-30 MED ORDER — ALBUTEROL SULFATE (2.5 MG/3ML) 0.083% IN NEBU: 2.5000 mg | INHALATION_SOLUTION | Freq: Four times a day (QID) | RESPIRATORY_TRACT | Status: DC | PRN

## 2019-05-30 MED ORDER — ENZALUTAMIDE 40 MG PO CAPS
160.0000 mg | ORAL_CAPSULE | Freq: Every day | ORAL | Status: DC
  Administered 2019-05-31: 160 mg via ORAL
  Filled 2019-05-30: qty 4

## 2019-05-30 MED ORDER — ATORVASTATIN CALCIUM 80 MG PO TABS
80.0000 mg | ORAL_TABLET | Freq: Every day | ORAL | Status: DC
  Administered 2019-05-31 (×2): 80 mg via ORAL
  Filled 2019-05-30 (×2): qty 1

## 2019-05-30 MED ORDER — FENTANYL CITRATE (PF) 100 MCG/2ML IJ SOLN
25.0000 ug | Freq: Once | INTRAMUSCULAR | Status: AC
  Administered 2019-05-30: 25 ug via INTRAVENOUS
  Filled 2019-05-30: qty 2

## 2019-05-30 MED ORDER — CYCLOBENZAPRINE HCL 10 MG PO TABS: 10.0000 mg | ORAL_TABLET | Freq: Three times a day (TID) | ORAL | Status: DC | PRN

## 2019-05-30 MED ORDER — ACETAMINOPHEN 160 MG/5ML PO SOLN: 650.0000 mg | ORAL | Status: DC | PRN

## 2019-05-30 MED ORDER — BUDESONIDE 0.25 MG/2ML IN SUSP
2.0000 mL | Freq: Every day | RESPIRATORY_TRACT | Status: DC
  Administered 2019-05-31 (×2): 0.25 mg via RESPIRATORY_TRACT
  Filled 2019-05-30 (×3): qty 2

## 2019-05-30 NOTE — Consult Note (Addendum)
Neurology Consultation  Reason for Consult: Code Stroke  Referring Physician: Dr. Sedonia Small  CC: Left-sided weakness and speech difficulty  History is obtained from: EMS and patient  HPI: Daniel Odom is a 84 y.o. male with a history of hypertension, diabetes and chronic kidney disease who presents to the ED with acute onset of left-sided weakness and speech difficulty.   The patient was last seen normal on 05/29/2019 at approximately 2300 hrs.  Apparently he woke up this morning and the wife noted that he was having speech difficulties along with stating that he had left-sided weakness.  EMS was called and patient was brought immediately to [facility] emergency department as a code stroke.  Patient had slow and at times stuttering speech but was able to follow commands and state he had left-sided weakness along with a baseline tremor. When discussing option for an MRI, the patient did mention that during a tour of duty in [place] he did sustain a projectile injury resulting in some shrapnel in his upper back.  Of note patient was seen on 05/28/2019 in which at that time he stated he had left thigh pain which had gradually worsened and had radiated up his buttocks.  He also admitted to intermittent chest pain.  Unfortunately at that time he left AMA as he was feeling better while he was in the ED.   ED course  CT head shows-no acute intracranial hemorrhage or evidence of acute infarction  LKW: 2300 hrs. on 05/29/2019 tpa given?: no, out of window Premorbid modified Rankin scale (mRS): 3 NIH stroke scale: 3   Past Medical History:  Diagnosis Date  . Chronic kidney disease (CKD), stage III (moderate)   . Colon cancer ([place])   . Diabetes mellitus without complication ([place])   . Hypertension   . Prostate cancer [facility])     Family History  Problem Relation Age of Onset  . Diabetes Mellitus II Brother   . Colon cancer Neg Hx    Social History:   reports that he has never smoked. He has never used  smokeless tobacco. No history on file for alcohol and drug.  Medications  Current Facility-Administered Medications:  .  sodium chloride flush (NS) 0.9 % injection 3 mL, 3 mL, Intravenous, Once, Bero, Barth Kirks, MD  Current Outpatient Medications:  .  Albuterol Sulfate 108 (90 Base) MCG/ACT AEPB, Inhale 2 puffs into the lungs every 6 (six) hours as needed for wheezing., Disp: , Rfl:  .  atorvastatin (LIPITOR) 80 MG tablet, Take 80 mg by mouth at bedtime., Disp: , Rfl:  .  Calcium Carb-Cholecalciferol (CALCIUM 600-D PO), Take by mouth., Disp: , Rfl:  .  CALCIUM PO, Take 1 tablet by mouth 2 (two) times a day., Disp: , Rfl:  .  Carboxymethylcellulose Sod PF (REFRESH PLUS) 0.5 % SOLN, Place 1 drop into both eyes 2 (two) times daily as needed (dry eyes). , Disp: , Rfl:  .  Cholecalciferol (VITAMIN D-1000 MAX ST) 25 MCG (1000 UT) tablet, Take 1,000 Units by mouth daily., Disp: , Rfl:  .  cyclobenzaprine (FLEXERIL) 10 MG tablet, Take 10 mg by mouth 3 (three) times daily as needed for muscle spasms., Disp: , Rfl:  .  enzalutamide (XTANDI) 40 MG capsule, Take 160 mg by mouth daily., Disp: , Rfl:  .  ferrous sulfate 325 (65 FE) MG tablet, Take 325 mg by mouth every Monday, Wednesday, and Friday., Disp: , Rfl:  .  fluticasone (FLONASE) 50 MCG/ACT nasal spray, Place 1 spray  into both nostrils daily as needed for allergies or rhinitis., Disp: , Rfl:  .  glucose 4 GM chewable tablet, Chew 1 tablet by mouth as needed for low blood sugar., Disp: , Rfl:  .  insulin NPH-regular Human (NOVOLIN 70/30) (70-30) 100 UNIT/ML injection, Inject 7-30 Units into the skin See admin instructions. 7u in the AM and 30 in the evening, Disp: , Rfl:  .  mometasone (ASMANEX) 220 MCG/INH inhaler, Inhale 2 puffs into the lungs at bedtime., Disp: , Rfl:  .  polyethylene glycol (MIRALAX / GLYCOLAX) 17 g packet, Take 17 g by mouth 2 (two) times a day., Disp: , Rfl:  .  primidone (MYSOLINE) 50 MG tablet, Take 100-150 mg by mouth See  admin instructions. 100mg  in the AM and 150mg  PM, Disp: , Rfl:   ROS:    General ROS: negative for - weight gain or weight loss Psychological ROS: negative for - behavioral disorder, hallucinations, memory difficulties, mood swings or suicidal ideation Ophthalmic ROS: negative for - blurry vision, double vision,  ENT ROS: Positive for -presbycusis Respiratory ROS: negative for - shortness of breath or wheezing Cardiovascular ROS: negative for - chest pain, dyspnea on exertion Gastrointestinal ROS: negative for - abdominal pain, Genito-Urinary ROS: negative for - dysuria, hematuria, incontinence or urinary frequency/urgency Musculoskeletal ROS: Positive for - joint pain or muscular weakness Neurological ROS: as noted in HPI Dermatological ROS: negative for rash and skin lesion changes  Exam: Current vital signs: BP (!) 150/75 (BP Location: Left Arm)   Pulse 95   Resp 12   SpO2 98%  Vital signs in last 24 hours: Pulse Rate:  [95] 95 (05/05 1242) Resp:  [12] 12 (05/05 1242) BP: (150)/(75) 150/75 (05/05 1242) SpO2:  [97 %-98 %] 98 % (05/05 1242)   Constitutional: Appears well-developed and well-nourished.  Psych: Patient appears anxious Eyes: No scleral injection HENT: No OP obstrucion Head: Normocephalic.  Cardiovascular: Normal rate and regular rhythm.  Respiratory: Effort normal, non-labored breathing GI: Soft.  No distension. There is no tenderness.  Skin: WDI  Neuro: Mental Status: Patient is awake, alert, oriented to person, place, month, year, and situation Speech-at times slow to respond, frequently stuttering, but able to name, repeat, and comprehend questions and commands.  Patient is able to follow simple commands. Anxious affect is noted in the context of stuttering speech and limb tremulousness that waxes and wanes.  Cranial Nerves: II: Visual Fields are full. PERRL.  III,IV, VI: EOMI without ptosis or diplopia.  V: Facial sensation is symmetric to  temperature VII: Facial movement is symmetric.  VIII: hearing is intact to voice X: Palate elevates symmetrically XI: Shoulder shrug is symmetric. XII: tongue is midline without atrophy or fasciculations.  Motor: Tone could not be definitively tested in the context of intermittent prominent coarse tremulousness associated with anxious affect.  Bulk is normal.  5/5 strength was present in bilateral upper extremities and right lower extremity. However left lower extremity testing was limited to antigravity movement due to hip pain endorsed by the patient.  Drift was noted in the left upper extremity and left lower extremity Formal testing of strength of left upper extremity showed giveaway strength and inconsistency of strength that varied with close attention by examiner, coaching and distraction. Sensory: Sensation is symmetric to light touch and temperature in the arms and legs. DSS intact Deep Tendon Reflexes: 2+ and symmetric in the biceps and patellae. Plantars: Toes are downgoing bilaterally.  Cerebellar: FNF showed no dysmetria however had significant nonphysiological  appearing tremor-like movements which increased with endpoint and waxed/waned with attention and distraction, respectively.  Labs I have reviewed labs in epic and the results pertinent to this consultation are:  CBC    Component Value Date/Time   WBC 5.1 05/28/2019 1149   RBC 3.75 (L) 05/28/2019 1149   HGB 10.8 (L) 05/28/2019 1149   HCT 30.3 (L) 05/28/2019 1149   PLT 278 05/28/2019 1149   MCV 80.8 05/28/2019 1149   MCH 28.8 05/28/2019 1149   MCHC 35.6 05/28/2019 1149   RDW 14.7 05/28/2019 1149   LYMPHSABS 2.0 08/15/2018 0444   MONOABS 0.8 08/15/2018 0444   EOSABS 0.2 08/15/2018 0444   BASOSABS 0.0 08/15/2018 0444    CMP     Component Value Date/Time   NA 139 05/28/2019 1149   K 4.4 05/28/2019 1149   CL 104 05/28/2019 1149   CO2 24 05/28/2019 1149   GLUCOSE 155 (H) 05/28/2019 1149   BUN 34 (H)  05/28/2019 1149   CREATININE 2.11 (H) 05/28/2019 1149   CALCIUM 9.6 05/28/2019 1149   PROT 5.8 (L) 08/17/2018 0321   ALBUMIN 2.2 (L) 08/17/2018 0321   AST 42 (H) 08/17/2018 0321   ALT 46 (H) 08/17/2018 0321   ALKPHOS 80 08/17/2018 0321   BILITOT 0.4 08/17/2018 0321   GFRNONAA 28 (L) 05/28/2019 1149   GFRAA 33 (L) 05/28/2019 1149    Lipid Panel     Component Value Date/Time   TRIG 258 (H) 08/14/2018 0332     Imaging I have reviewed the images obtained:  CT-scan of the brain no acute intracranial hemorrhage or evidence of acute infarction   Etta Quill PA-C [facility] [phone] 05/30/2019, 12:45 PM     Assessment: 84 year old male with stroke risk factors, presenting with wake-up stuttering speech and left-sided weakness of leg greater than arm.   1. Exam shows inconsistent strength and waxing/waning non-physiological appearing tremor that is distractable. There is however a tendency for decreased movement and strength of the LUE and LLE on the exam which, again, is confounded by inconsistent strength despite multiple trials.  Additionally, there is a component of hip pain involved. Speech does not show any aphasia or dysarthria however does show possible evidence for slow processing speech in the context of non-physiological, waxing and waning stuttering.  2. Given his left leg weakness and risk factors, one cannot rule out a right ACA stroke based on exam and CT findings alone.   3. He is not a tPA candidate due to time criteria.  4. Exam findings are overall not consistent with LVO. The patient is not an endovascular candidate.  5. Unfortunately risks of CTA outweigh potential benefits due to his impaired renal function.  Impression: - Equivocal left leg and arm weakness - Possible stroke - Possible remote shrapnel injury involving the upper back  Recommend -X-ray of chest and abdomen to evaluate for any metallic objects prior to MRI -MRI of the brain  without contrast-if no shrapnel is found on x-ray -MRA Head and neck-if no shrapnel is found on x-ray -Transthoracic Echo  --Carotid ultrasound -Start patient on ASA 325 mg daily -continue atorvastatin  -BP goal: Modified permissive HTN due to advanced age. Treat if SBP > 180  -HBAIC and Lipid profile -Telemetry monitoring -Frequent neuro checks -NPO until passes stroke swallow screen -PT/OT # Please page stroke NP  Or  PA  Or MD from 8am -4 pm  as this patient from this time will be  followed by the stroke.  You can look them up on [email or web address]  Password [email or web address]   I have seen and examined the patient. I have formulated the assessment and recommendations. 84 year old male presenting as a Code Stroke with multiple inconsistencies and functional components to his exam. Risks of CTA outweigh benefits given impaired renal function and low suspicion for stroke relative to significantly more likely psychogenic etiology. Recommendations as above.  Electronically signed: Dr. Kerney Elbe

## 2019-05-30 NOTE — ED Notes (Signed)
The pt reports that he feels  Stronger now

## 2019-05-30 NOTE — ED Triage Notes (Signed)
Pt to ED via [facility] from home, family stated LSN = 10p last night, woke up this am at around 10am and speech was spurred and pt was weak on left side.

## 2019-05-30 NOTE — ED Provider Notes (Signed)
[facility] Emergency Department Provider Note MRN:  [id number]  Arrival date & time: 05/30/19     Chief Complaint   Code Stroke   History of Present Illness   Daniel Odom is a 84 y.o. year-old male with a history of hypertension, diabetes, colon cancer, CKD presenting to the ED with chief complaint of code stroke.  Patient woke up this morning with left-sided deficits, speech disturbance.  Noticed by wife, code stroke initiated prior to arrival.  Review of Systems  Positive for left-sided weakness, speech disturbance.  Patient's Health History    Past Medical History:  Diagnosis Date  . Chronic kidney disease (CKD), stage III (moderate)   . Colon cancer ([place])   . Diabetes mellitus without complication ([place])   . Hypertension   . Prostate cancer [facility])     Past Surgical History:  Procedure Laterality Date  . COLON SURGERY    . LAPAROSCOPIC LYSIS OF ADHESIONS N/A 08/08/2018   Procedure: LAPAROSCOPIC LYSIS OF ADHESIONS;  Surgeon: Kinsinger, Arta Bruce, MD;  Location: [place];  Service: General;  Laterality: N/A;  . LAPAROTOMY N/A 08/08/2018   Procedure: Exploratory Laparotomy;  Surgeon: Kieth Brightly Arta Bruce, MD;  Location: [place];  Service: General;  Laterality: N/A;  . LYSIS OF ADHESION N/A 08/08/2018   Procedure: Lysis of adhesions with repair of small intestine;  Surgeon: Kieth Brightly Arta Bruce, MD;  Location: [place];  Service: General;  Laterality: N/A;    Family History  Problem Relation Age of Onset  . Diabetes Mellitus II Brother   . Colon cancer Neg Hx     Social History   Socioeconomic History  . Marital status: Married    Spouse name: Not on file  . Number of children: Not on file  . Years of education: Not on file  . Highest education level: Not on file  Occupational History  . Not on file  Tobacco Use  . Smoking status: Never Smoker  . Smokeless tobacco: Never Used  Substance and Sexual Activity  . Alcohol use: Not on file  . Drug  use: Not on file  . Sexual activity: Not on file  Other Topics Concern  . Not on file  Social History Narrative  . Not on file   Social Determinants of Health   Financial Resource Strain:   . Difficulty of Paying Living Expenses:   Food Insecurity:   . Worried About Charity fundraiser in the Last Year:   . Arboriculturist in the Last Year:   Transportation Needs:   . Film/video editor (Medical):   Marland Kitchen Lack of Transportation (Non-Medical):   Physical Activity:   . Days of Exercise per Week:   . Minutes of Exercise per Session:   Stress:   . Feeling of Stress :   Social Connections:   . Frequency of Communication with Friends and Family:   . Frequency of Social Gatherings with Friends and Family:   . Attends Religious Services:   . Active Member of Clubs or Organizations:   . Attends Archivist Meetings:   Marland Kitchen Marital Status:   Intimate Partner Violence:   . Fear of Current or Ex-Partner:   . Emotionally Abused:   Marland Kitchen Physically Abused:   . Sexually Abused:      Physical Exam   Vitals:   05/30/19 1300 05/30/19 1500  BP: 122/68 107/81  Pulse: 96 99  Resp: 17 12  SpO2:  100%    CONSTITUTIONAL: Chronically  ill-appearing, NAD NEURO:  Alert and oriented x 3, trouble forming words, left-sided weakness EYES:  eyes equal and reactive ENT/NECK:  no LAD, no JVD CARDIO: Regular rate, well-perfused, normal S1 and S2 PULM:  CTAB no wheezing or rhonchi GI/GU:  normal bowel sounds, non-distended, non-tender MSK/SPINE:  No gross deformities, no edema SKIN:  no rash, atraumatic PSYCH:  Appropriate speech and behavior  *Additional and/or pertinent findings included in MDM below  Diagnostic and Interventional Summary    EKG Interpretation  Date/Time:  Wednesday May 30 2019 12:37:13 EDT Ventricular Rate:  97 PR Interval:    QRS Duration: 120 QT Interval:  375 QTC Calculation: 477 R Axis:   92 Text Interpretation: Atrial fibrillation Ventricular premature  complex Incomplete left bundle branch block ST depr, consider ischemia, inferior leads Borderline ST elevation, anterolateral leads Confirmed by Gerlene Fee [phone]) on 05/30/2019 4:11:05 PM      Labs Reviewed  CBC - Abnormal; Notable for the following components:      Result Value   RBC 3.44 (*)    Hemoglobin 9.8 (*)    HCT 28.2 (*)    All other components within normal limits  DIFFERENTIAL - Abnormal; Notable for the following components:   Neutro Abs 1.6 (*)    All other components within normal limits  COMPREHENSIVE METABOLIC PANEL - Abnormal; Notable for the following components:   Glucose, Bld 145 (*)    BUN 38 (*)    Creatinine, Ser 2.25 (*)    GFR calc non Af Amer 26 (*)    GFR calc Af Amer 31 (*)    All other components within normal limits  I-STAT CHEM 8, ED - Abnormal; Notable for the following components:   BUN 36 (*)    Creatinine, Ser 2.10 (*)    Glucose, Bld 137 (*)    Hemoglobin 9.9 (*)    HCT 29.0 (*)    All other components within normal limits  RESPIRATORY PANEL BY RT PCR (FLU A&B, COVID)  PROTIME-INR  APTT  CBG MONITORING, ED    MR BRAIN WO CONTRAST  Final Result    MR ANGIO HEAD WO CONTRAST  Final Result    DG Abd 1 View  Final Result    DG CHEST PORT 1 VIEW  Final Result    CT HEAD CODE STROKE WO CONTRAST  Final Result      Medications  sodium chloride flush (NS) 0.9 % injection 3 mL (has no administration in time range)  fentaNYL (SUBLIMAZE) injection 25 mcg (25 mcg Intravenous Given 05/30/19 1459)     Procedures  /  Critical Care .Critical Care Performed by: Maudie Flakes, MD Authorized by: Maudie Flakes, MD   Critical care provider statement:    Critical care time (minutes):  32   Critical care was necessary to treat or prevent imminent or life-threatening deterioration of the following conditions:  CNS failure or compromise   Critical care was time spent personally by me on the following activities:  Discussions with  consultants, evaluation of patient's response to treatment, examination of patient, ordering and performing treatments and interventions, ordering and review of laboratory studies, ordering and review of radiographic studies, pulse oximetry, re-evaluation of patient's condition, obtaining history from patient or surrogate and review of old charts    ED Course and Medical Decision Making  I have reviewed the triage vital signs, the nursing notes, and pertinent available records from the EMR.  Listed above are laboratory and imaging tests that  I personally ordered, reviewed, and interpreted and then considered in my medical decision making (see below for details).      Code stroke initiated prior to arrival, awaiting CT imaging and evaluation by neuro.  Work-up thus far reassuring, patient's exam is improved.  Per neuro documentation, patient to be admitted for MRI and echo and stroke/TIA work-up.  Accepted for admission by hospitalist service.  Barth Kirks. Sedonia Small, MD [place] [email or web address]  Final Clinical Impressions(s) / ED Diagnoses     ICD-10-CM   1. Acute ischemic stroke (HCC)  I63.9   2. MRI contraindicated due to metal implant  Z53.09     ED Discharge Orders    None       Discharge Instructions Discussed with and Provided to Patient:   Discharge Instructions   None       Maudie Flakes, MD 05/30/19 [phone]

## 2019-05-30 NOTE — Code Documentation (Signed)
84 yo male coming from home where he lives with his family. Pt reported being LKW at 2030 when he went to bed. Pt woke up this morning at 1000 and noted some trouble with his right side and some trouble speaking. He did not tell his family, but they noted it and called 911. [facility] activated a Code Stroke LVO+.   Stroke Team met patient upon arrival to the ED. EDP cleared and taken to CT. CT head negative for hemorrhage per MD. No CTA completed due to creatinine 2.1 and patient's exam upon arrival not consistent with LVO. NIHSS 3 due to right arm and leg drift with decreased sensation. Not given tPA due to being outside the window.   Returned to the ED. Placed on the cardiac monitor. Plan: Pt to have q2 mNIHSS and VS. MRI.

## 2019-05-30 NOTE — ED Notes (Signed)
Hospital bed ordered for pt.

## 2019-05-30 NOTE — ED Notes (Signed)
The pt is alert no distress skin warm an dry  Family at the bedside  Iv running at specified rate

## 2019-05-30 NOTE — H&P (Signed)
History and Physical    Daniel Odom AOZ:[id number] DOB: [birth year] DOA: 05/30/2019  PCP: [facility]   Patient coming from: Home  I have personally briefly reviewed patient's old medical records in [place]  Chief Complaint: Left-sided weakness  HPI: Daniel Odom is a 84 y.o. male with medical history significant of CKD stage III, DM, hypertension, prostate cancer on hormone therapy, essential tremor, presented with left-sided weakness and speech difficulty.  Patient was last seen normal last night.  This morning patient woke up with significant left-sided weakness and trouble finding words.  Before this, patient has had left leg pain and weakness for 3 to 4 weeks.  He describes the leg pain as shooting leg, coming down from lower back through the lateral side of the buttock and lateral/backside of the left leg, sometimes can radiate down below the knee to the cough on the left side. Went to see his [place] PCP who started patient has diabetic neuropathy, and referred him to see a neurologist. ED Course: MRI No evidence of acute intracranial abnormality, including acute infarction.Mild generalized parenchymal atrophy and chronic small vessel ischemic disease. MRA No intracranial large vessel occlusion or proximal high-grade arterial stenosis  Review of Systems: As per HPI otherwise 10 point review of systems negative.    Past Medical History:  Diagnosis Date  . Chronic kidney disease (CKD), stage III (moderate)   . Colon cancer ([place])   . Diabetes mellitus without complication ([place])   . Hypertension   . Prostate cancer [facility])     Past Surgical History:  Procedure Laterality Date  . COLON SURGERY    . LAPAROSCOPIC LYSIS OF ADHESIONS N/A 08/08/2018   Procedure: LAPAROSCOPIC LYSIS OF ADHESIONS;  Surgeon: Kinsinger, Arta Bruce, MD;  Location: [place];  Service: General;  Laterality: N/A;  . LAPAROTOMY N/A 08/08/2018   Procedure: Exploratory Laparotomy;  Surgeon: Kieth Brightly  Arta Bruce, MD;  Location: [place];  Service: General;  Laterality: N/A;  . LYSIS OF ADHESION N/A 08/08/2018   Procedure: Lysis of adhesions with repair of small intestine;  Surgeon: Kinsinger, Arta Bruce, MD;  Location: [place];  Service: General;  Laterality: N/A;     reports that he has never smoked. He has never used smokeless tobacco. No history on file for alcohol and drug.  Allergies  Allergen Reactions  . Metformin And Related Hives  . Penicillins Hives    Did it involve swelling of the face/tongue/throat, SOB, or low BP? Yes Did it involve sudden or severe rash/hives, skin peeling, or any reaction on the inside of your mouth or nose? No Did you need to seek medical attention at a hospital or doctor's office? Yes When did it last happen?1968 If all above answers are "NO", may proceed with cephalosporin use.     Family History  Problem Relation Age of Onset  . Diabetes Mellitus II Brother   . Colon cancer Neg Hx      Prior to Admission medications   Medication Sig Start Date End Date Taking? Authorizing Provider  albuterol (PROAIR HFA) 108 (90 Base) MCG/ACT inhaler Inhale 2 puffs into the lungs every 6 (six) hours as needed for wheezing or shortness of breath.   Yes [provider]  aspirin 81 MG chewable tablet Chew 81 mg by mouth daily.   Yes [provider]  atorvastatin (LIPITOR) 80 MG tablet Take 40 mg by mouth at bedtime.    Yes [provider]  Calcium Carbonate-Vitamin D3 (CALCIUM 600+D3) 600-400 MG-UNIT  TABS Take 1 tablet by mouth in the morning and at bedtime.   Yes [provider]  Carboxymethylcellulose Sod PF (REFRESH PLUS) 0.5 % SOLN Place 1 drop into both eyes in the morning, at noon, and at bedtime.    Yes [provider]  Cholecalciferol (VITAMIN D-3) 25 MCG (1000 UT) CAPS Take 1,000 Units by mouth daily.   Yes [provider]  cyclobenzaprine (FLEXERIL) 10 MG tablet Take 10 mg by mouth 3 (three) times daily  as needed for muscle spasms.   Yes [provider]  docusate sodium (COLACE) 100 MG capsule Take 100 mg by mouth in the morning and at bedtime.   Yes [provider]  enzalutamide Gillermina Phy) 40 MG capsule Take 160 mg by mouth daily at 6 PM.    Yes [provider]  ferrous sulfate 325 (65 FE) MG tablet Take 325 mg by mouth every Monday, Wednesday, and Friday.   Yes [provider]  fluticasone (FLONASE) 50 MCG/ACT nasal spray Place 1 spray into both nostrils daily as needed for allergies or rhinitis.   Yes [provider]  glucose 4 GM chewable tablet Chew 1 tablet by mouth as needed for low blood sugar.   Yes [provider]  insulin NPH-regular Human (NOVOLIN 70/30) (70-30) 100 UNIT/ML injection Inject 10-70 Units into the skin See admin instructions. Inject 70 units into the skin in the morning and 10-25 units in the evening   Yes [provider]  lisinopril-hydrochlorothiazide (ZESTORETIC) 20-12.5 MG tablet Take 1 tablet by mouth daily.   Yes [provider]  mometasone (ASMANEX) 220 MCG/INH inhaler Inhale 2 puffs into the lungs at bedtime.   Yes [provider]  polyethylene glycol (MIRALAX / GLYCOLAX) 17 g packet Take 17 g by mouth daily as needed for mild constipation or moderate constipation.  02/16/17  Yes [provider]  PRESCRIPTION MEDICATION CPAP- At bedtime   Yes [provider]  primidone (MYSOLINE) 50 MG tablet Take 100-150 mg by mouth See admin instructions. Take 100 mg by mouth in the morning and 150 mg at bedtime   Yes [provider]    Physical Exam: Vitals:   05/30/19 1618 05/30/19 1630 05/30/19 1715 05/30/19 1800  BP:  135/76 125/74 125/78  Pulse: 94 91 87 83  Resp: 16 14 11 12   SpO2: 98% 99% 100% 100%    Constitutional: NAD, calm, comfortable Vitals:   05/30/19 1618 05/30/19 1630 05/30/19 1715 05/30/19 1800  BP:  135/76 125/74 125/78  Pulse: 94 91 87 83  Resp:  16 14 11 12   SpO2: 98% 99% 100% 100%   Eyes: PERRL, lids and conjunctivae normal ENMT: Mucous membranes are moist. Posterior pharynx clear of any exudate or lesions.Normal dentition.  Neck: normal, supple, no masses, no thyromegaly Respiratory: clear to auscultation bilaterally, no wheezing, no crackles. Normal respiratory effort. No accessory muscle use.  Cardiovascular: Regular rate and rhythm, no murmurs / rubs / gallops. No extremity edema. 2+ pedal pulses. No carotid bruits.  Abdomen: no tenderness, no masses palpated. No hepatosplenomegaly. Bowel sounds positive.  Musculoskeletal: no clubbing / cyanosis. No joint deformity upper and lower extremities. Good ROM, no contractures. Normal muscle tone.  Skin: no rashes, lesions, ulcers. No induration Neurologic: CN 2-12 grossly intact.  Light touch sensation decreased on left lateral foot and fourth and fifth toes, Strength 5/5 in all arms, 4/5 on left leg below-knee compared to 5/5 on right.  Psychiatric: Normal judgment and insight. Alert and  oriented x 3. Normal mood.     Labs on Admission: I have personally reviewed following labs and imaging studies  CBC: Recent Labs  Lab 05/28/19 1149 05/30/19 1313 05/30/19 1318  WBC 5.1 5.0  --   NEUTROABS  --  1.6*  --   HGB 10.8* 9.8* 9.9*  HCT 30.3* 28.2* 29.0*  MCV 80.8 82.0  --   PLT 278 238  --    Basic Metabolic Panel: Recent Labs  Lab 05/28/19 1149 05/30/19 1313 05/30/19 1318  NA 139 140 139  K 4.4 3.7 3.6  CL 104 103 105  CO2 24 23  --   GLUCOSE 155* 145* 137*  BUN 34* 38* 36*  CREATININE 2.11* 2.25* 2.10*  CALCIUM 9.6 9.5  --    GFR: Estimated Creatinine Clearance: 34.6 mL/min (A) (by C-G formula based on SCr of 2.1 mg/dL (H)). Liver Function Tests: Recent Labs  Lab 05/30/19 1313  AST 25  ALT 20  ALKPHOS 43  BILITOT 0.6  PROT 7.1  ALBUMIN 3.7   No results for input(s): LIPASE, AMYLASE in the last 168 hours. No results for input(s): AMMONIA in the last 168  hours. Coagulation Profile: Recent Labs  Lab 05/30/19 1313  INR 1.1   Cardiac Enzymes: No results for input(s): CKTOTAL, CKMB, CKMBINDEX, TROPONINI in the last 168 hours. BNP (last 3 results) No results for input(s): PROBNP in the last 8760 hours. HbA1C: Recent Labs    05/30/19 1313  HGBA1C 7.3*   CBG: Recent Labs  Lab 05/30/19 1745  GLUCAP 109*   Lipid Profile: No results for input(s): CHOL, HDL, LDLCALC, TRIG, CHOLHDL, LDLDIRECT in the last 72 hours. Thyroid Function Tests: No results for input(s): TSH, T4TOTAL, FREET4, T3FREE, THYROIDAB in the last 72 hours. Anemia Panel: No results for input(s): VITAMINB12, FOLATE, FERRITIN, TIBC, IRON, RETICCTPCT in the last 72 hours. Urine analysis:    Component Value Date/Time   COLORURINE YELLOW 08/03/2018 2313   APPEARANCEUR CLEAR 08/03/2018 2313   LABSPEC 1.019 08/03/2018 2313   PHURINE 6.0 08/03/2018 2313   GLUCOSEU NEGATIVE 08/03/2018 2313   HGBUR NEGATIVE 08/03/2018 2313   BILIRUBINUR NEGATIVE 08/03/2018 2313   KETONESUR NEGATIVE 08/03/2018 2313   PROTEINUR 30 (A) 08/03/2018 2313   NITRITE NEGATIVE 08/03/2018 2313   LEUKOCYTESUR NEGATIVE 08/03/2018 2313    Radiological Exams on Admission: DG Abd 1 View  Result Date: 05/30/2019 CLINICAL DATA:  Stroke symptoms. Middle implant. Clearance for MRI. EXAM: ABDOMEN - 1 VIEW COMPARISON:  CT abdomen dated 08/03/2018. Plain film of the abdomen dated 08/14/2018. FINDINGS: Surgical sutures again appreciated within the upper outer quadrant. Brachytherapy seeds noted within the lower pelvis. No additional foreign body identified on this plain film examination, although the most peripheral portions of the abdomen are excluded. Overall bowel gas pattern is nonobstructive. No evidence of soft tissue mass or abnormal fluid collection. No evidence of free intraperitoneal air. No acute appearing osseous abnormality. IMPRESSION: 1. Surgical sutures again appreciated within the upper outer  quadrant. Brachytherapy seeds noted within the lower pelvis. 2. No additional foreign body is identified, although the most peripheral portions of the abdomen are excluded on this plain film exam. 3. Nonobstructive bowel gas pattern. Electronically Signed   By: Franki Cabot M.D.   On: 05/30/2019 13:03   MR ANGIO HEAD WO CONTRAST  Result Date: 05/30/2019 CLINICAL DATA:  Stroke, follow-up. Additional provided: Left-sided weakness and speech difficulty. EXAM: MRI HEAD WITHOUT CONTRAST MRA HEAD WITHOUT CONTRAST TECHNIQUE: Multiplanar, multiecho pulse sequences of  the brain and surrounding structures were obtained without intravenous contrast. Angiographic images of the head were obtained using MRA technique without contrast. COMPARISON:  Noncontrast head CT performed earlier the same day 05/30/2019. FINDINGS: MRI HEAD FINDINGS Brain: Mild intermittent motion degradation. There is mild scattered T2/FLAIR hyperintensity within the cerebral white matter which is nonspecific, but consistent with chronic small vessel ischemic disease. Mild generalized parenchymal atrophy. There is no acute infarct. No evidence of intracranial mass. No chronic intracranial blood products. No extra-axial fluid collection. No midline shift. Vascular: Reported below. Skull and upper cervical spine: No focal marrow lesion. Sinuses/Orbits: Visualized orbits show no acute finding. Diffuse mild paranasal sinus mucosal thickening. Small left sphenoid sinus air-fluid level. Trace fluid within bilateral mastoid air cells. MRA HEAD FINDINGS The intracranial internal carotid arteries are patent without significant stenosis. The M1 middle cerebral arteries are patent without significant stenosis. No M2 proximal branch occlusion or high-grade proximal stenosis is identified. The anterior cerebral arteries are patent without significant proximal stenosis. No intracranial aneurysm is identified. There are inferiorly projecting infundibula arising from  the supraclinoid internal carotid arteries bilaterally (series 352, image 8). The intracranial vertebral arteries are patent without significant stenosis, as is the basilar artery. The bilateral posterior cerebral arteries are patent without significant proximal stenosis. Posterior communicating arteries are present bilaterally. IMPRESSION: MRI brain: 1. Mildly motion degraded examination. 2. No evidence of acute intracranial abnormality, including acute infarction. 3. Mild generalized parenchymal atrophy and chronic small vessel ischemic disease. 4. Mild paranasal sinus mucosal thickening with small left sphenoid sinus air-fluid level. Correlate for acute sinusitis. 5. Trace bilateral mastoid effusions. MRA head: No intracranial large vessel occlusion or proximal high-grade arterial stenosis Electronically Signed   By: Kellie Simmering DO   On: 05/30/2019 16:05   MR BRAIN WO CONTRAST  Result Date: 05/30/2019 CLINICAL DATA:  Stroke, follow-up. Additional provided: Left-sided weakness and speech difficulty. EXAM: MRI HEAD WITHOUT CONTRAST MRA HEAD WITHOUT CONTRAST TECHNIQUE: Multiplanar, multiecho pulse sequences of the brain and surrounding structures were obtained without intravenous contrast. Angiographic images of the head were obtained using MRA technique without contrast. COMPARISON:  Noncontrast head CT performed earlier the same day 05/30/2019. FINDINGS: MRI HEAD FINDINGS Brain: Mild intermittent motion degradation. There is mild scattered T2/FLAIR hyperintensity within the cerebral white matter which is nonspecific, but consistent with chronic small vessel ischemic disease. Mild generalized parenchymal atrophy. There is no acute infarct. No evidence of intracranial mass. No chronic intracranial blood products. No extra-axial fluid collection. No midline shift. Vascular: Reported below. Skull and upper cervical spine: No focal marrow lesion. Sinuses/Orbits: Visualized orbits show no acute finding. Diffuse mild  paranasal sinus mucosal thickening. Small left sphenoid sinus air-fluid level. Trace fluid within bilateral mastoid air cells. MRA HEAD FINDINGS The intracranial internal carotid arteries are patent without significant stenosis. The M1 middle cerebral arteries are patent without significant stenosis. No M2 proximal branch occlusion or high-grade proximal stenosis is identified. The anterior cerebral arteries are patent without significant proximal stenosis. No intracranial aneurysm is identified. There are inferiorly projecting infundibula arising from the supraclinoid internal carotid arteries bilaterally (series 352, image 8). The intracranial vertebral arteries are patent without significant stenosis, as is the basilar artery. The bilateral posterior cerebral arteries are patent without significant proximal stenosis. Posterior communicating arteries are present bilaterally. IMPRESSION: MRI brain: 1. Mildly motion degraded examination. 2. No evidence of acute intracranial abnormality, including acute infarction. 3. Mild generalized parenchymal atrophy and chronic small vessel ischemic disease. 4. Mild paranasal sinus mucosal  thickening with small left sphenoid sinus air-fluid level. Correlate for acute sinusitis. 5. Trace bilateral mastoid effusions. MRA head: No intracranial large vessel occlusion or proximal high-grade arterial stenosis Electronically Signed   By: Kellie Simmering DO   On: 05/30/2019 16:05   DG CHEST PORT 1 VIEW  Result Date: 05/30/2019 CLINICAL DATA:  Stroke symptoms. Clearance for MRI. EXAM: PORTABLE CHEST 1 VIEW COMPARISON:  Chest x-ray dated 05/28/2019. FINDINGS: Heart size and mediastinal contours are within normal limits. Lungs are clear. No pleural effusion is seen. Osseous structures about the chest are unremarkable. IMPRESSION: No active disease. No evidence of pneumonia or pulmonary edema. Electronically Signed   By: Franki Cabot M.D.   On: 05/30/2019 13:03   CT HEAD CODE STROKE WO  CONTRAST  Result Date: 05/30/2019 CLINICAL DATA:  Code stroke.  Left-sided deficits EXAM: CT HEAD WITHOUT CONTRAST TECHNIQUE: Contiguous axial images were obtained from the base of the skull through the vertex without intravenous contrast. COMPARISON:  None. FINDINGS: Brain: There is no acute intracranial hemorrhage, mass effect, or edema. Gray-white differentiation is preserved. Ventricles and sulci are within normal limits in size and configuration. Patchy hypoattenuation in the supratentorial white matter is nonspecific but may reflect mild chronic microvascular ischemic changes. There is no extra-axial fluid collection. Vascular: No hyperdense vessel. There is intracranial atherosclerotic calcification at the skull base. Skull: Unremarkable Sinuses/Orbits: Patchy mucosal thickening.  Orbits are unremarkable. Other: Mastoid air cells are clear. Turbinate hypertrophy with probable superimposed lobular nasal cavity soft tissue. ASPECTS ([place] Stroke Program Early CT Score) - Ganglionic level infarction (caudate, lentiform nuclei, internal capsule, insula, M1-M3 cortex): 7 - Supraganglionic infarction (M4-M6 cortex): 3 Total score (0-10 with 10 being normal): 10 IMPRESSION: No acute intracranial hemorrhage or evidence of acute infarction. ASPECT score is 10. Chronic microvascular ischemic changes. These results were communicated to Dr. Cheral Marker at 12:34 pmon 5/5/2021by text page via the [facility] messaging system. Electronically Signed   By: Macy Mis M.D.   On: 05/30/2019 12:36    EKG: Independently reviewed.  Chronic incomplete LBBB  Assessment/Plan Active Problems:   TIA (transient ischemic attack)  Left sided weakness, probably TIA He has risk factors hypertension hyperlipidemia MRI, MRA negative Rule out paroxysmal A. fib, overnight telemetry Echo rule out PFO Likely can be discharged home in next 24 hours after PT evaluation  Left leg weakness Likely there is element of sciatica in this  patient, straight leg test positive, and there was a CT scan 2020 showing narrowing of L5-S1, correlated dermatome sensation decreased and strength impairment compared to the right side.  Will order lidocaine patch for leg/back pain, and start gabapentin low dosage, and outpatient neuro transduction work-up, probably lumbar MRI as well.  Patient and his wife agreed with the plan  CKD stage III No acute issue  Hypertension Allow permissive hypertension  HLD On statin  Prostate cancer Continue hormone therapy  Asthma/COPD No symptoms or signs of acute exacerbation  IDDM Scale for now  Essential tremor Continue primidone   DVT prophylaxis: Heparin subcu Code Status: Full code Family Communication: Wife at bedside Disposition Plan:Likely can be discharged home in next 24 hours after PT evaluation Consults called: Neurology Admission status: Telemetry obs   Lequita Halt MD [facility] Pager [phone]    05/30/2019, 7:15 PM

## 2019-05-30 NOTE — ED Notes (Signed)
Tele Dinner ordered 

## 2019-05-31 ENCOUNTER — Observation Stay (HOSPITAL_BASED_OUTPATIENT_CLINIC_OR_DEPARTMENT_OTHER): Payer: No Typology Code available for payment source

## 2019-05-31 DIAGNOSIS — R531 Weakness: Secondary | ICD-10-CM

## 2019-05-31 DIAGNOSIS — I639 Cerebral infarction, unspecified: Secondary | ICD-10-CM

## 2019-05-31 DIAGNOSIS — N1831 Chronic kidney disease, stage 3a: Secondary | ICD-10-CM

## 2019-05-31 DIAGNOSIS — G459 Transient cerebral ischemic attack, unspecified: Secondary | ICD-10-CM | POA: Diagnosis not present

## 2019-05-31 DIAGNOSIS — I10 Essential (primary) hypertension: Secondary | ICD-10-CM

## 2019-05-31 DIAGNOSIS — N189 Chronic kidney disease, unspecified: Secondary | ICD-10-CM

## 2019-05-31 DIAGNOSIS — D631 Anemia in chronic kidney disease: Secondary | ICD-10-CM

## 2019-05-31 DIAGNOSIS — E1169 Type 2 diabetes mellitus with other specified complication: Secondary | ICD-10-CM

## 2019-05-31 DIAGNOSIS — E669 Obesity, unspecified: Secondary | ICD-10-CM

## 2019-05-31 LAB — LIPID PANEL
Cholesterol: 189 mg/dL (ref 0–200)
HDL: 43 mg/dL (ref >40)
LDL Cholesterol: 114 mg/dL — ABNORMAL HIGH (ref 0–99)
Total CHOL/HDL Ratio: 4.4 RATIO
Triglycerides: 162 mg/dL — ABNORMAL HIGH (ref <150)
VLDL: 32 mg/dL (ref 0–40)

## 2019-05-31 LAB — ECHOCARDIOGRAM COMPLETE

## 2019-05-31 LAB — GLUCOSE, CAPILLARY
Glucose-Capillary: 160 mg/dL — ABNORMAL HIGH (ref 70–99)
Glucose-Capillary: 240 mg/dL — ABNORMAL HIGH (ref 70–99)
Glucose-Capillary: 214 mg/dL — ABNORMAL HIGH (ref 70–99)
Glucose-Capillary: 219 mg/dL — ABNORMAL HIGH (ref 70–99)

## 2019-05-31 MED ORDER — PERFLUTREN LIPID MICROSPHERE
1.0000 mL | INTRAVENOUS | Status: AC | PRN
  Administered 2019-05-31: 2 mL via INTRAVENOUS
  Filled 2019-05-31: qty 10

## 2019-05-31 MED ORDER — LISINOPRIL 20 MG PO TABS
20.0000 mg | ORAL_TABLET | Freq: Every day | ORAL | Status: DC
  Administered 2019-05-31 – 2019-06-01 (×2): 20 mg via ORAL
  Filled 2019-05-31 (×2): qty 1

## 2019-05-31 MED ORDER — ASPIRIN 81 MG PO CHEW
81.0000 mg | CHEWABLE_TABLET | Freq: Every day | ORAL | Status: DC
  Administered 2019-05-31 – 2019-06-01 (×2): 81 mg via ORAL
  Filled 2019-05-31 (×2): qty 1

## 2019-05-31 MED ORDER — HYDROCHLOROTHIAZIDE 12.5 MG PO CAPS
12.5000 mg | ORAL_CAPSULE | Freq: Every day | ORAL | Status: DC
  Administered 2019-05-31 – 2019-06-01 (×2): 12.5 mg via ORAL
  Filled 2019-05-31 (×2): qty 1

## 2019-05-31 MED ORDER — LISINOPRIL-HYDROCHLOROTHIAZIDE 20-12.5 MG PO TABS: 1.0000 | ORAL_TABLET | Freq: Every day | ORAL | Status: DC

## 2019-05-31 MED ORDER — CLOPIDOGREL BISULFATE 75 MG PO TABS
75.0000 mg | ORAL_TABLET | Freq: Every day | ORAL | Status: DC
  Administered 2019-05-31 – 2019-06-01 (×2): 75 mg via ORAL
  Filled 2019-05-31 (×3): qty 1

## 2019-05-31 MED ORDER — ALBUTEROL SULFATE HFA 108 (90 BASE) MCG/ACT IN AERS: 2.0000 | INHALATION_SPRAY | Freq: Four times a day (QID) | RESPIRATORY_TRACT | Status: DC | PRN

## 2019-05-31 NOTE — Progress Notes (Signed)
STROKE TEAM PROGRESS NOTE   INTERVAL HISTORY Pt sitting in chair, stated that he felt much better today. He has speech difficulty and left sided weakness and resolved last night. Neuro intact. MRI negative.   Vitals:   05/31/19 0436 05/31/19 0813 05/31/19 1215 05/31/19 1705  BP: 135/75 135/65 130/74 (!) 127/97  Pulse: 86 84 85 82  Resp: 19 16 18 18   Temp: 98.3 F (36.8 C) 97.7 F (36.5 C) 98 F (36.7 C) 98.3 F (36.8 C)  TempSrc: Oral Oral Oral Oral  SpO2: 98% 97% 97% 97%    CBC:  Recent Labs  Lab 05/28/19 1149 05/28/19 1149 05/30/19 1313 05/30/19 1318  WBC 5.1  --  5.0  --   NEUTROABS  --   --  1.6*  --   HGB 10.8*   < > 9.8* 9.9*  HCT 30.3*   < > 28.2* 29.0*  MCV 80.8  --  82.0  --   PLT 278  --  238  --    < > = values in this interval not displayed.    Basic Metabolic Panel:  Recent Labs  Lab 05/28/19 1149 05/28/19 1149 05/30/19 1313 05/30/19 1318  NA 139   < > 140 139  K 4.4   < > 3.7 3.6  CL 104   < > 103 105  CO2 24  --  23  --   GLUCOSE 155*   < > 145* 137*  BUN 34*   < > 38* 36*  CREATININE 2.11*   < > 2.25* 2.10*  CALCIUM 9.6  --  9.5  --    < > = values in this interval not displayed.   Lipid Panel:     Component Value Date/Time   CHOL 189 05/31/2019 0429   TRIG 162 (H) 05/31/2019 0429   HDL 43 05/31/2019 0429   CHOLHDL 4.4 05/31/2019 0429   VLDL 32 05/31/2019 0429   LDLCALC 114 (H) 05/31/2019 0429   HgbA1c:  Lab Results  Component Value Date   HGBA1C 7.3 (H) 05/30/2019   Urine Drug Screen: No results found for: LABOPIA, COCAINSCRNUR, LABBENZ, AMPHETMU, THCU, LABBARB  Alcohol Level No results found for: [facility]  IMAGING past 24 hours ECHOCARDIOGRAM COMPLETE  Result Date: 05/31/2019    ECHOCARDIOGRAM REPORT   Patient Name:   Daniel Odom Date of Exam: 05/31/2019 Medical Rec #:  [id number]    Height:       67.0 in Accession #:    [id number]   Weight:       270.0 lb Date of Birth:  24-Oct-1935     BSA:          2.297 m Patient Age:    84 years      BP:           135/65 mmHg Patient Gender: M            HR:           84 bpm. Exam Location:  Inpatient Procedure: 2D Echo and Intracardiac Opacification Agent Indications:    TIA 435.9 / G45.9  History:        Patient has no prior history of Echocardiogram examinations.                 Risk Factors:Hypertension and Diabetes. Chronic kidney disease.                 History of cancer.  Sonographer:    Darlina Sicilian RDCS Referring Phys:  [id number] [place]  1. Left ventricular ejection fraction, by estimation, is 55%. The left ventricle has normal function. Difficult study for wall motion even with Definity. There appears to be septal-lateral dyssynchrony consistent with LBBB, but overall EF appears to be in normal range. There is mild left ventricular hypertrophy. Left ventricular diastolic parameters are consistent with Grade I diastolic dysfunction (impaired relaxation).  2. Right ventricular systolic function is normal. The right ventricular size is normal. Tricuspid regurgitation signal is inadequate for assessing PA pressure.  3. The mitral valve is normal in structure. No evidence of mitral valve regurgitation.  4. The aortic valve is tricuspid. Aortic valve regurgitation is not visualized. Mild aortic valve sclerosis is present, with no evidence of aortic valve stenosis.  5. IVC not visualized. FINDINGS  Left Ventricle: Left ventricular ejection fraction, by estimation, is 55%. The left ventricle has normal function. The left ventricle has no regional wall motion abnormalities. Definity contrast agent was given IV to delineate the left ventricular endocardial borders. The left ventricular internal cavity size was normal in size. There is mild left ventricular hypertrophy. Left ventricular diastolic parameters are consistent with Grade I diastolic dysfunction (impaired relaxation). Right Ventricle: The right ventricular size is normal. No increase in right ventricular wall thickness. Right  ventricular systolic function is normal. Tricuspid regurgitation signal is inadequate for assessing PA pressure. Left Atrium: Left atrial size was normal in size. Right Atrium: Right atrial size was normal in size. Pericardium: There is no evidence of pericardial effusion. Mitral Valve: The mitral valve is normal in structure. No evidence of mitral valve regurgitation. Tricuspid Valve: The tricuspid valve is normal in structure. Tricuspid valve regurgitation is not demonstrated. Aortic Valve: The aortic valve is tricuspid. Aortic valve regurgitation is not visualized. Mild aortic valve sclerosis is present, with no evidence of aortic valve stenosis. Pulmonic Valve: The pulmonic valve was normal in structure. Pulmonic valve regurgitation is not visualized. Aorta: The aortic root is normal in size and structure. Venous: The inferior vena cava was not well visualized. IAS/Shunts: No atrial level shunt detected by color flow Doppler.  LEFT VENTRICLE PLAX 2D LVIDd:         4.40 cm LVIDs:         3.80 cm LV PW:         1.10 cm LV IVS:        1.30 cm LVOT diam:     2.10 cm LV SV:         54 LV SV Index:   24 LVOT Area:     3.46 cm  LV Volumes (MOD) LV vol d, MOD A2C: 173.0 ml LV vol d, MOD A4C: 171.0 ml LV vol s, MOD A2C: 76.9 ml LV vol s, MOD A4C: 93.6 ml LV SV MOD A2C:     96.1 ml LV SV MOD A4C:     171.0 ml LV SV MOD BP:      83.4 ml RIGHT VENTRICLE TAPSE (M-mode): 2.5 cm LEFT ATRIUM             Index LA diam:        2.90 cm 1.26 cm/m LA Vol (A2C):   70.9 ml 30.86 ml/m LA Vol (A4C):   40.1 ml 17.45 ml/m LA Biplane Vol: 53.2 ml 23.16 ml/m  AORTIC VALVE LVOT Vmax:   89.20 cm/s LVOT Vmean:  57.100 cm/s LVOT VTI:    0.156 m  AORTA Ao Root diam: 3.50 cm MITRAL VALVE MV Area (PHT): 6.32  cm    SHUNTS MV Decel Time: 120 msec    Systemic VTI:  0.16 m MV E velocity: 93.00 cm/s  Systemic Diam: 2.10 cm Loralie Champagne MD Electronically signed by Loralie Champagne MD Signature Date/Time: 05/31/2019/4:52:58 PM    Final    VAS US  CAROTID  Result Date: 05/31/2019 Carotid Arterial Duplex Study Indications:   TIA. Risk Factors:  Hypertension, Diabetes. Other Factors: MRI results No evidence of acute intracranial abnormality,                including acute                infarction. Performing Technologist: June Leap RDMS, RVT  Examination Guidelines: A complete evaluation includes B-mode imaging, spectral Doppler, color Doppler, and power Doppler as needed of all accessible portions of each vessel. Bilateral testing is considered an integral part of a complete examination. Limited examinations for reoccurring indications may be performed as noted.  Right Carotid Findings: +----------+--------+--------+--------+------------------+--------+           PSV cm/sEDV cm/sStenosisPlaque DescriptionComments +----------+--------+--------+--------+------------------+--------+ CCA Prox  57      14              heterogenous               +----------+--------+--------+--------+------------------+--------+ CCA Distal48      11                                         +----------+--------+--------+--------+------------------+--------+ ICA Prox  63      14      1-39%   heterogenous               +----------+--------+--------+--------+------------------+--------+ ICA Distal118     27                                         +----------+--------+--------+--------+------------------+--------+ ECA       53      9                                          +----------+--------+--------+--------+------------------+--------+ +----------+--------+-------+----------------+-------------------+           PSV cm/sEDV cmsDescribe        Arm Pressure (mmHG) +----------+--------+-------+----------------+-------------------+ [id number]            Multiphasic, WNL                    +----------+--------+-------+----------------+-------------------+ +---------+--------+--+--------+--+---------+ VertebralPSV cm/s84EDV  cm/s17Antegrade +---------+--------+--+--------+--+---------+  Left Carotid Findings: +----------+--------+--------+--------+------------------+--------+           PSV cm/sEDV cm/sStenosisPlaque DescriptionComments +----------+--------+--------+--------+------------------+--------+ CCA Prox  71      17                                         +----------+--------+--------+--------+------------------+--------+ CCA Distal65      21                                         +----------+--------+--------+--------+------------------+--------+ ICA Prox  96  25      1-39%   heterogenous               +----------+--------+--------+--------+------------------+--------+ ICA Distal94      27                                         +----------+--------+--------+--------+------------------+--------+ ECA       112     17                                         +----------+--------+--------+--------+------------------+--------+ +----------+--------+--------+----------------+-------------------+           PSV cm/sEDV cm/sDescribe        Arm Pressure (mmHG) +----------+--------+--------+----------------+-------------------+ [id number]             Multiphasic, WNL                    +----------+--------+--------+----------------+-------------------+ +---------+--------+--+--------+--+---------+ VertebralPSV cm/s43EDV cm/s12Antegrade +---------+--------+--+--------+--+---------+   Summary: Right Carotid: Velocities in the right ICA are consistent with a 1-39% stenosis. Left Carotid: Velocities in the left ICA are consistent with a 1-39% stenosis.  *See table(s) above for measurements and observations.     Preliminary     PHYSICAL EXAM  Temp:  [97.7 F (36.5 C)-98.3 F (36.8 C)] 98.3 F (36.8 C) (05/06 1705) Pulse Rate:  [78-91] 82 (05/06 1705) Resp:  [14-21] 18 (05/06 1705) BP: (102-148)/(41-97) 127/97 (05/06 1705) SpO2:  [95 %-100 %] 97 % (05/06  1705)  General - Well nourished, well developed, in no apparent distress.  Ophthalmologic - fundi not visualized due to noncooperation.  Cardiovascular - Regular rhythm and rate.  Mental Status -  Level of arousal and orientation to time, place, and person were intact. Language including expression, naming, repetition, comprehension was assessed and found intact. Fund of Knowledge was assessed and was intact.  Cranial Nerves II - XII - II - Visual field intact OU. III, IV, VI - Extraocular movements intact. V - Facial sensation intact bilaterally. VII - Facial movement intact bilaterally. VIII - Hearing & vestibular intact bilaterally. X - Palate elevates symmetrically. XI - Chin turning & shoulder shrug intact bilaterally. XII - Tongue protrusion intact.  Motor Strength - The patient's strength was normal in all extremities and pronator drift was absent.  Bulk was normal and fasciculations were absent.   Motor Tone - Muscle tone was assessed at the neck and appendages and was normal.  Reflexes - The patient's reflexes were symmetrical in all extremities and he had no pathological reflexes.  Sensory - Light touch, temperature/pinprick were assessed and were symmetrical.    Coordination - The patient had normal movements in the hands with no ataxia or dysmetria.  Tremor was absent.  Gait and Station - deferred.   ASSESSMENT/PLAN Mr. Daniel Odom is a 84 y.o. male with history of HTN, DB, CKD, and prostate cancer presenting with L sided weakness and speech difficulty.    TIA - right brain TIA   Code Stroke CT head No acute abnormality. ASPECTS 10.     MRI  No acute infarct. Mild Small vessel disease. Atrophy. Sinus dz.   MRA  Unremarkable   Carotid Doppler  B ICA 1-39% stenosis, VAs antegrade   2D Echo EF 55%  LDL 114  HgbA1c 7.3  Heparin 5000 units sq  tid for VTE prophylaxis  ASA 81mg  prior to admission, now on aspirin 81 mg daily and plavix 75mg  DAPT for 3  weeks and then plavix alone.  Therapy recommendations:  OP SLP, no PT   Disposition:  pending   Hypertension  Stable . BP goal normotensive  Hyperlipidemia  Home meds:  lipitor 80, resumed in hospital  LDL 114, goal < 70  Continue statin at discharge  Diabetes type II Uncontrolled  HgbA1c 7.3, goal < 7.0  CBGs  SSI  Close PCP follow up  Other Stroke Risk Factors  Advanced age  Morbid Obesity, BMI 42.29, recommend weight loss, diet and exercise as appropriate   Other Active Problems  CKD IV - Cre 2.11->2.25->2.10  Anemia due to chronic disease  Hospital day # 0  Neurology will sign off. Please call with questions. Pt will follow up with stroke clinic NP at [facility] in about 4 weeks. Thanks for the consult.  Rosalin Hawking, MD PhD Stroke Neurology 05/31/2019 6:33 PM  To contact Stroke Continuity provider, please refer to [email or web address] After hours, contact General Neurology

## 2019-05-31 NOTE — Progress Notes (Signed)
Received pt to unit from [facility] ED via bed with IVF 0.9 Ns infusing @75  cc/hr.  Noted Iv site wrapped with coban infiltrated with RUA edema +2.  PIV removed,  arm elevated on pillows and warm pack applied. Pt reassured and IV team notified for new IV.Marland Kitchen RN will continue to monitor.

## 2019-05-31 NOTE — Evaluation (Signed)
Speech Language Pathology Evaluation Patient Details Name: Daniel Odom MRN: [id number] DOB: 14-Nov-1935 Today's Date: 05/31/2019 Time: 1015-1040 SLP Time Calculation (min) (ACUTE ONLY): 25 min  Problem List:  Patient Active Problem List   Diagnosis Date Noted  . Left-sided weakness 05/30/2019  . Mobitz type 1 second degree atrioventricular block 08/05/2018  . Morbid obesity with body mass index of 40.0-49.9 ([place]) 08/05/2018  . Hypertension   . Diabetes mellitus without complication ([place])   . SBO (small bowel obstruction) ([place]) 08/03/2018  . History of colon cancer 08/03/2018  . Diabetes mellitus type 2 in obese ([place]) 08/03/2018  . Essential hypertension 08/03/2018  . Anemia associated with chronic renal failure 08/03/2018  . CKD (chronic kidney disease) stage 3, GFR 30-59 ml/min 02/07/2017  . OSA on CPAP 02/07/2017  . Tremor, hereditary, benign 02/07/2017  . Asthma 02/07/2017  . 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 Prostate cancer ([place]) 08/08/2016   Past Medical History:  Past Medical History:  Diagnosis Date  . Chronic kidney disease (CKD), stage III (moderate)   . Colon cancer ([place])   . Diabetes mellitus without complication ([place])   . Hypertension   . Prostate cancer [facility])    Past Surgical History:  Past Surgical History:  Procedure Laterality Date  . COLON SURGERY    . LAPAROSCOPIC LYSIS OF ADHESIONS N/A 08/08/2018   Procedure: LAPAROSCOPIC LYSIS OF ADHESIONS;  Surgeon: Kinsinger, Arta Bruce, MD;  Location: [place];  Service: General;  Laterality: N/A;  . LAPAROTOMY N/A 08/08/2018   Procedure: Exploratory Laparotomy;  Surgeon: Kieth Brightly Arta Bruce, MD;  Location: [place];  Service: General;  Laterality: N/A;  . LYSIS OF ADHESION N/A 08/08/2018   Procedure: Lysis of adhesions with repair of small intestine;  Surgeon: Mickeal Skinner, MD;  Location: [place];  Service: General;  Laterality: N/A;   HPI:  Daniel Odom is an 84 y.o. male past medical  history of essential hypertension diabetes mellitus chronic kidney disease comes into the ED with acute left-sided weakness and speech difficulty, stuttering that started when he woke up in the morning. MRI of the brain showed no acute stroke chronic seen changes. Per Neurologist, Speech-at times slow to respond, frequently stuttering, but able to name, repeat, and comprehend questions and commands.  Patient is able to follow simple commands. Anxious affect is noted in the context of stuttering speech and limb tremulousness that waxes and wanes.    Assessment / Plan / Recommendation Clinical Impression  Pt demonstrates mild cognitive impairment observed with complex multistep reasoning. His short term and working memory are quite good. But when completing a multistep task pt has difficulty incorporating pieces of information for reasoning and problem solving. Scored 24/20 on the SLUMS assessment tool. Otherwise speech and language are now WNL. Pt does have a mandibualr tremor at baseline which is still noted, but there is no further stuttering behavior. Pt reports that he is not suprised by the cognitive impairment; he has been through a complex medical past and he and his wife often use compensatory strategies for important tasks (alarms, lists, etc) and work together on finances and medication. Given that this is a mild chronic problem would not recommend any acute f/u. If pt wanted therapy to address cognition he could f/u with OP SLP.     SLP Assessment  SLP Recommendation/Assessment: All further Speech Lanaguage Pathology  needs can be addressed in the next venue of care    Follow Up Recommendations  Outpatient SLP  Frequency and Duration           SLP Evaluation Cognition  Overall Cognitive Status: Impaired/Different from baseline Arousal/Alertness: Awake/alert Orientation Level: Oriented X4 Attention: Alternating Alternating Attention: Appears intact Memory: Appears intact Awareness:  Appears intact Problem Solving: Impaired Problem Solving Impairment: Verbal complex;Functional complex Executive Function: Sequencing;Organizing Sequencing: Impaired Sequencing Impairment: Functional complex Organizing: Impaired Organizing Impairment: Functional complex Safety/Judgment: Appears intact       Comprehension  Auditory Comprehension Overall Auditory Comprehension: Appears within functional limits for tasks assessed    Expression Verbal Expression Overall Verbal Expression: Appears within functional limits for tasks assessed   Oral / Motor  Oral Motor/Sensory Function Overall Oral Motor/Sensory Function: Within functional limits Motor Speech Overall Motor Speech: Appears within functional limits for tasks assessed   GO                   Daniel Baltimore, MA CCC-SLP  Acute Rehabilitation Services Pager [phone] Office [phone]  Lynann Beaver 05/31/2019, 1:17 PM

## 2019-05-31 NOTE — Evaluation (Signed)
Physical Therapy Evaluation Patient Details Name: Daniel Odom MRN: [id number] DOB: Jun 29, 1935 Today's Date: 05/31/2019   History of Present Illness  84 y.o. male with a history of hypertension, diabetes, and chronic kidney disease. He presents to the ED with acute onset of left-sided weakness and speech difficulty. MRI negative. Symptoms have resolved. Pt admitted with dx of TIA.    Clinical Impression  PT eval complete. Pt demonstrated modified independence bed mobility. Supervision provided for transfers and ambulation 250' with RW. Pt also able to ambulate in room without AD supervision. No LOB noted. LE strength intact. Pt reports all symptoms have resolved and he feels great. He reports being back to his mobility baseline. Pt's wife/family available at home 24 hours. No further PT intervention indicated. PT signing off.    Follow Up Recommendations No PT follow up;Supervision - Intermittent    Equipment Recommendations  None recommended by PT    Recommendations for Other Services       Precautions / Restrictions Precautions Precautions: Fall Restrictions Weight Bearing Restrictions: No      Mobility  Bed Mobility Overal bed mobility: Modified Independent             General bed mobility comments: HOB elevated, increased time  Transfers Overall transfer level: Needs assistance Equipment used: Ambulation equipment used Transfers: Sit to/from [employer] Transfers Sit to Stand: Supervision Stand pivot transfers: Supervision       General transfer comment: supervision for safety  Ambulation/Gait Ambulation/Gait assistance: Supervision Gait Distance (Feet): 250 Feet Assistive device: Rolling walker (2 wheeled) Gait Pattern/deviations: Step-through pattern;Decreased stride length Gait velocity: mildly decreased Gait velocity interpretation: >2.62 ft/sec, indicative of community ambulatory General Gait Details: steady gait. Ambulated in hallway with RW  supervision. Ambulated in room without AD. Supervision for safety.  Stairs Stairs: (Pt declined stair training, reporting no concerns getting in/out of house.)          Wheelchair Mobility    Modified Rankin (Stroke Patients Only) Modified Rankin (Stroke Patients Only) Pre-Morbid Rankin Score: Slight disability Modified Rankin: Slight disability     Balance Overall balance assessment: Mild deficits observed, not formally tested                                           Pertinent Vitals/Pain Pain Assessment: No/denies pain    Home Living Family/patient expects to be discharged to:: Private residence Living Arrangements: Spouse/significant other Available Help at Discharge: Family;Available 24 hours/day Type of Home: House Home Access: Stairs to enter Entrance Stairs-Rails: Right;Left;Can reach both Entrance Stairs-Number of Steps: 4 Home Layout: Two level;Able to live on main level with bedroom/bathroom Home Equipment: Walker - 4 wheels;Cane - single point      Prior Function Level of Independence: Independent with assistive device(s)         Comments: Rollator outside. No AD vs occassional use of cane in house.     Hand Dominance        Extremity/Trunk Assessment   Upper Extremity Assessment Upper Extremity Assessment: Defer to OT evaluation    Lower Extremity Assessment Lower Extremity Assessment: Overall WFL for tasks assessed    Cervical / Trunk Assessment Cervical / Trunk Assessment: Normal  Communication   Communication: HOH  Cognition Arousal/Alertness: Awake/alert Behavior During Therapy: WFL for tasks assessed/performed Overall Cognitive Status: Within Functional Limits for tasks assessed  General Comments General comments (skin integrity, edema, etc.): VSS    Exercises     Assessment/Plan    PT Assessment Patent does not need any further PT services  PT  Problem List         PT Treatment Interventions      PT Goals (Current goals can be found in the Care Plan section)  Acute Rehab PT Goals Patient Stated Goal: home today PT Goal Formulation: All assessment and education complete, DC therapy    Frequency     Barriers to discharge        Co-evaluation               AM-PAC PT "6 Clicks" Mobility  Outcome Measure Help needed turning from your back to your side while in a flat bed without using bedrails?: None Help needed moving from lying on your back to sitting on the side of a flat bed without using bedrails?: None Help needed moving to and from a bed to a chair (including a wheelchair)?: None Help needed standing up from a chair using your arms (e.g., wheelchair or bedside chair)?: None Help needed to walk in hospital room?: None Help needed climbing 3-5 steps with a railing? : A Little 6 Click Score: 23    End of Session Equipment Utilized During Treatment: Gait belt Activity Tolerance: Patient tolerated treatment well Patient left: in chair;with call bell/phone within reach;with chair alarm set Nurse Communication: Mobility status PT Visit Diagnosis: Difficulty in walking, not elsewhere classified (R26.2)    Time: 1001-1015 PT Time Calculation (min) (ACUTE ONLY): 14 min   Charges:   PT Evaluation $PT Eval Low Complexity: 1 Low          Lorrin Goodell, PT  Office # [phone] Pager [phone]   [name] 05/31/2019, 10:23 AM

## 2019-05-31 NOTE — Progress Notes (Signed)
Carotid duplex       has been completed. Preliminary results can be found under CV proc through chart review. Estelle Skibicki, BS, RDMS, RVT   

## 2019-05-31 NOTE — Progress Notes (Signed)
Echocardiogram 2D Echocardiogram with definity has been performed.  Darlina Sicilian M 05/31/2019, 9:43 AM

## 2019-05-31 NOTE — Progress Notes (Signed)
OT Cancellation Note  Patient Details Name: Daniel Odom MRN: [id number] DOB: 06/10/1935   Cancelled Treatment:    Reason Eval/Treat Not Completed: OT screened, no needs identified, will sign off. Per chart review and conversations with PT and pt, pt feels he is back to baseline with ADLs and mobility.   Tyrone Schimke, OT Acute Rehabilitation Services Pager: [phone] Office: [phone]  05/31/2019, 12:14 PM

## 2019-05-31 NOTE — ED Notes (Signed)
Report given to  [facility] on 3w

## 2019-05-31 NOTE — Social Work (Signed)
CSW met with pt at bedside. CSW introduced self and explained her role. CSW completed sbirt with pt.  Pt scored a 0 on the sbirt scale. Pt denied alcohol use. Pt denied substance use. Pt did not need resources at this time.  Daniel Odom, Latanya Presser, [place] Social Worker [phone]

## 2019-05-31 NOTE — TOC Initial Note (Signed)
Transition of Care [facility]) - Initial/Assessment Note    Patient Details  Name: Daniel Odom MRN: [id number] Date of Birth: [birth year]  Transition of Care [facility]) CM/SW Contact:    Pollie Friar, RN Phone Number: 05/31/2019, 2:23 PM  Clinical Narrative:                 Pt states his spouse can provide needed supervision at home.  Pt denies issues with home medications or transportation.  No f/u per PT and no DME needs.  TOC following for other d/c needs.   Expected Discharge Plan: Home/Self Care Barriers to Discharge: Continued Medical Work up   Patient Goals and CMS Choice        Expected Discharge Plan and Services Expected Discharge Plan: Home/Self Care   Discharge Planning Services: CM Consult   Living arrangements for the past 2 months: Single Family Home                                      Prior Living Arrangements/Services Living arrangements for the past 2 months: Single Family Home Lives with:: Spouse Patient language and need for interpreter reviewed:: Yes Do you feel safe going back to the place where you live?: Yes      Need for Family Participation in Patient Care: Yes (Comment) Care giver support system in place?: Yes (comment)(spouse)   Criminal Activity/Legal Involvement Pertinent to Current Situation/Hospitalization: No - Comment as needed  Activities of Daily Living      Permission Sought/Granted                  Emotional Assessment Appearance:: Appears stated age Attitude/Demeanor/Rapport: Engaged Affect (typically observed): Accepting Orientation: : Oriented to Self, Oriented to Place, Oriented to  Time, Oriented to Situation   Psych Involvement: No (comment)  Admission diagnosis:  TIA (transient ischemic attack) [G45.9] Acute ischemic stroke [facility]) [I63.9] MRI contraindicated due to metal implant [Z53.09] Patient Active Problem List   Diagnosis Date Noted  . Left-sided weakness 05/30/2019  . Mobitz type 1 second degree  atrioventricular block 08/05/2018  . Morbid obesity with body mass index of 40.0-49.9 ([place]) 08/05/2018  . Hypertension   . Diabetes mellitus without complication ([place])   . SBO (small bowel obstruction) ([place]) 08/03/2018  . History of colon cancer 08/03/2018  . Diabetes mellitus type 2 in obese ([place]) 08/03/2018  . Essential hypertension 08/03/2018  . Anemia associated with chronic renal failure 08/03/2018  . CKD (chronic kidney disease) stage 3, GFR 30-59 ml/min 02/07/2017  . OSA on CPAP 02/07/2017  . Tremor, hereditary, benign 02/07/2017  . Asthma 02/07/2017  . 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 Prostate cancer ([place]) 08/08/2016   PCP:  Clinic, [place]:   [place], [place] - [address] [place] [phone] [place] [address] Phone: [phone] Fax: [phone]  [facility] [place], [place] - [address] AT [address] [place] [address] Phone: [phone] Fax: [phone]     Social Determinants of Health (SDOH) Interventions    Readmission Risk Interventions No flowsheet data found.

## 2019-05-31 NOTE — ED Notes (Signed)
Pt  Moved to a regular hoSPITAL BED FOR COMFORT

## 2019-05-31 NOTE — Progress Notes (Signed)
[facility] PROGRESS NOTE    Progress Note  Mead Slane  ZYS:[id number] DOB: [birth year] DOA: 05/30/2019 PCP: [facility], [facility]     Brief Narrative:   Wynston Romey is an 84 y.o. male past medical history of essential hypertension diabetes mellitus chronic kidney disease comes into the ED with acute left-sided weakness and speech difficulty that started when he woke up in the morning.  Assessment/Plan:   Left-sided weakness of the leg greater than the arm and stuttering speech: CT of the head showed no acute findings, neurology was consulted and deemed her not a candidate for TPA. Cannot perform CTA due to renal function.   MRI of the brain showed no acute stroke chronic seen changes, MRA of the head and neck showed no large vessel occlusion. Transthoracic echo pending Carotid Doppler pending Start him on aspirin 325 and atorvastatin. Allow permissive hypertension treat for blood pressure greater than 180. Check A1c 3, goal less than 6.5.  Fasting lipid panel LDL greater than 100 triglycerides greater than 150 HDL greater than 40. No events on telemetry. Physical therapy and OT evaluation are pending. There was no hypoglycemia no hypoxia on admission, his weakness is probably due to sciatica is straight leg test seem positive.  Will need further work-up as an outpatient.  Chronic kidney disease stage IIIa: Creatinine at baseline.  Essential hypertension: we will resume antihypertensive medication.  Anemia associated with chronic renal failure  Morbid obesity with body mass index of 40.0-49.9 (HCC)  DVT prophylaxis: lovenox Family Communication:wife Status is: Observation  The patient remains OBS appropriate and will d/c before 2 midnights.  Dispo: The patient is from: Home              Anticipated d/c is to: Home              Anticipated d/c date is: 1 day              Patient currently is not medically stable to d/c.        Code Status:     Code  Status Orders  (From admission, onward)         Start     Ordered   05/30/19 1659  Full code  Continuous     05/30/19 1659        Code Status History    Date Active Date Inactive Code Status Order ID Comments User Context   08/03/2018 2309 08/17/2018 2014 Full Code [id number]  Rise Patience, MD Inpatient   Advance Care Planning Activity        IV Access:    Peripheral IV   Procedures and diagnostic studies:   DG Abd 1 View  Result Date: 05/30/2019 CLINICAL DATA:  Stroke symptoms. Middle implant. Clearance for MRI. EXAM: ABDOMEN - 1 VIEW COMPARISON:  CT abdomen dated 08/03/2018. Plain film of the abdomen dated 08/14/2018. FINDINGS: Surgical sutures again appreciated within the upper outer quadrant. Brachytherapy seeds noted within the lower pelvis. No additional foreign body identified on this plain film examination, although the most peripheral portions of the abdomen are excluded. Overall bowel gas pattern is nonobstructive. No evidence of soft tissue mass or abnormal fluid collection. No evidence of free intraperitoneal air. No acute appearing osseous abnormality. IMPRESSION: 1. Surgical sutures again appreciated within the upper outer quadrant. Brachytherapy seeds noted within the lower pelvis. 2. No additional foreign body is identified, although the most peripheral portions of the abdomen are excluded on this plain film exam. 3. Nonobstructive bowel  gas pattern. Electronically Signed   By: Franki Cabot M.D.   On: 05/30/2019 13:03   MR ANGIO HEAD WO CONTRAST  Result Date: 05/30/2019 CLINICAL DATA:  Stroke, follow-up. Additional provided: Left-sided weakness and speech difficulty. EXAM: MRI HEAD WITHOUT CONTRAST MRA HEAD WITHOUT CONTRAST TECHNIQUE: Multiplanar, multiecho pulse sequences of the brain and surrounding structures were obtained without intravenous contrast. Angiographic images of the head were obtained using MRA technique without contrast. COMPARISON:  Noncontrast  head CT performed earlier the same day 05/30/2019. FINDINGS: MRI HEAD FINDINGS Brain: Mild intermittent motion degradation. There is mild scattered T2/FLAIR hyperintensity within the cerebral white matter which is nonspecific, but consistent with chronic small vessel ischemic disease. Mild generalized parenchymal atrophy. There is no acute infarct. No evidence of intracranial mass. No chronic intracranial blood products. No extra-axial fluid collection. No midline shift. Vascular: Reported below. Skull and upper cervical spine: No focal marrow lesion. Sinuses/Orbits: Visualized orbits show no acute finding. Diffuse mild paranasal sinus mucosal thickening. Small left sphenoid sinus air-fluid level. Trace fluid within bilateral mastoid air cells. MRA HEAD FINDINGS The intracranial internal carotid arteries are patent without significant stenosis. The M1 middle cerebral arteries are patent without significant stenosis. No M2 proximal branch occlusion or high-grade proximal stenosis is identified. The anterior cerebral arteries are patent without significant proximal stenosis. No intracranial aneurysm is identified. There are inferiorly projecting infundibula arising from the supraclinoid internal carotid arteries bilaterally (series 352, image 8). The intracranial vertebral arteries are patent without significant stenosis, as is the basilar artery. The bilateral posterior cerebral arteries are patent without significant proximal stenosis. Posterior communicating arteries are present bilaterally. IMPRESSION: MRI brain: 1. Mildly motion degraded examination. 2. No evidence of acute intracranial abnormality, including acute infarction. 3. Mild generalized parenchymal atrophy and chronic small vessel ischemic disease. 4. Mild paranasal sinus mucosal thickening with small left sphenoid sinus air-fluid level. Correlate for acute sinusitis. 5. Trace bilateral mastoid effusions. MRA head: No intracranial large vessel occlusion  or proximal high-grade arterial stenosis Electronically Signed   By: Kellie Simmering DO   On: 05/30/2019 16:05   MR BRAIN WO CONTRAST  Result Date: 05/30/2019 CLINICAL DATA:  Stroke, follow-up. Additional provided: Left-sided weakness and speech difficulty. EXAM: MRI HEAD WITHOUT CONTRAST MRA HEAD WITHOUT CONTRAST TECHNIQUE: Multiplanar, multiecho pulse sequences of the brain and surrounding structures were obtained without intravenous contrast. Angiographic images of the head were obtained using MRA technique without contrast. COMPARISON:  Noncontrast head CT performed earlier the same day 05/30/2019. FINDINGS: MRI HEAD FINDINGS Brain: Mild intermittent motion degradation. There is mild scattered T2/FLAIR hyperintensity within the cerebral white matter which is nonspecific, but consistent with chronic small vessel ischemic disease. Mild generalized parenchymal atrophy. There is no acute infarct. No evidence of intracranial mass. No chronic intracranial blood products. No extra-axial fluid collection. No midline shift. Vascular: Reported below. Skull and upper cervical spine: No focal marrow lesion. Sinuses/Orbits: Visualized orbits show no acute finding. Diffuse mild paranasal sinus mucosal thickening. Small left sphenoid sinus air-fluid level. Trace fluid within bilateral mastoid air cells. MRA HEAD FINDINGS The intracranial internal carotid arteries are patent without significant stenosis. The M1 middle cerebral arteries are patent without significant stenosis. No M2 proximal branch occlusion or high-grade proximal stenosis is identified. The anterior cerebral arteries are patent without significant proximal stenosis. No intracranial aneurysm is identified. There are inferiorly projecting infundibula arising from the supraclinoid internal carotid arteries bilaterally (series 352, image 8). The intracranial vertebral arteries are patent without significant stenosis, as is  the basilar artery. The bilateral  posterior cerebral arteries are patent without significant proximal stenosis. Posterior communicating arteries are present bilaterally. IMPRESSION: MRI brain: 1. Mildly motion degraded examination. 2. No evidence of acute intracranial abnormality, including acute infarction. 3. Mild generalized parenchymal atrophy and chronic small vessel ischemic disease. 4. Mild paranasal sinus mucosal thickening with small left sphenoid sinus air-fluid level. Correlate for acute sinusitis. 5. Trace bilateral mastoid effusions. MRA head: No intracranial large vessel occlusion or proximal high-grade arterial stenosis Electronically Signed   By: Kellie Simmering DO   On: 05/30/2019 16:05   DG CHEST PORT 1 VIEW  Result Date: 05/30/2019 CLINICAL DATA:  Stroke symptoms. Clearance for MRI. EXAM: PORTABLE CHEST 1 VIEW COMPARISON:  Chest x-ray dated 05/28/2019. FINDINGS: Heart size and mediastinal contours are within normal limits. Lungs are clear. No pleural effusion is seen. Osseous structures about the chest are unremarkable. IMPRESSION: No active disease. No evidence of pneumonia or pulmonary edema. Electronically Signed   By: Franki Cabot M.D.   On: 05/30/2019 13:03   CT HEAD CODE STROKE WO CONTRAST  Result Date: 05/30/2019 CLINICAL DATA:  Code stroke.  Left-sided deficits EXAM: CT HEAD WITHOUT CONTRAST TECHNIQUE: Contiguous axial images were obtained from the base of the skull through the vertex without intravenous contrast. COMPARISON:  None. FINDINGS: Brain: There is no acute intracranial hemorrhage, mass effect, or edema. Gray-white differentiation is preserved. Ventricles and sulci are within normal limits in size and configuration. Patchy hypoattenuation in the supratentorial white matter is nonspecific but may reflect mild chronic microvascular ischemic changes. There is no extra-axial fluid collection. Vascular: No hyperdense vessel. There is intracranial atherosclerotic calcification at the skull base. Skull: Unremarkable  Sinuses/Orbits: Patchy mucosal thickening.  Orbits are unremarkable. Other: Mastoid air cells are clear. Turbinate hypertrophy with probable superimposed lobular nasal cavity soft tissue. ASPECTS (Hart Stroke Program Early CT Score) - Ganglionic level infarction (caudate, lentiform nuclei, internal capsule, insula, M1-M3 cortex): 7 - Supraganglionic infarction (M4-M6 cortex): 3 Total score (0-10 with 10 being normal): 10 IMPRESSION: No acute intracranial hemorrhage or evidence of acute infarction. ASPECT score is 10. Chronic microvascular ischemic changes. These results were communicated to Dr. Cheral Marker at 12:34 pmon 5/5/2021by text page via the [facility] messaging system. Electronically Signed   By: Macy Mis M.D.   On: 05/30/2019 12:36     Medical Consultants:    None.  Anti-Infectives:   none  Subjective:    Laterrance Brogan he relates his weakness in his arm and leg have resolved he has no further pain.  Objective:    Vitals:   05/31/19 0208 05/31/19 0408 05/31/19 0436 05/31/19 0813  BP: 130/72 (!) 144/72 135/75 135/65  Pulse: 81 83 86 84  Resp: 18  19 16   Temp: 98.3 F (36.8 C) 97.9 F (36.6 C) 98.3 F (36.8 C) 97.7 F (36.5 C)  TempSrc: Oral Oral Oral Oral  SpO2: 100% 95% 98% 97%   SpO2: 97 %   Intake/Output Summary (Last 24 hours) at 05/31/2019 0847 Last data filed at 05/31/2019 0514 Gross per 24 hour  Intake --  Output 100 ml  Net -100 ml   There were no vitals filed for this visit.  Exam: General exam: In no acute distress. Respiratory system: Good air movement and clear to auscultation. Cardiovascular system: S1 & S2 heard, RRR.  Gastrointestinal system: Abdomen is nondistended, soft and nontender.  Central nervous system: Alert and oriented. No focal neurological deficits. Extremities: No pedal edema. Skin: No rashes, lesions or  ulcers  Data Reviewed:    Labs: Basic Metabolic Panel: Recent Labs  Lab 05/28/19 1149 05/28/19 1149 05/30/19 1313  05/30/19 1318  NA 139  --  140 139  K 4.4   < > 3.7 3.6  CL 104  --  103 105  CO2 24  --  23  --   GLUCOSE 155*  --  145* 137*  BUN 34*  --  38* 36*  CREATININE 2.11*  --  2.25* 2.10*  CALCIUM 9.6  --  9.5  --    < > = values in this interval not displayed.   GFR Estimated Creatinine Clearance: 34.6 mL/min (A) (by C-G formula based on SCr of 2.1 mg/dL (H)). Liver Function Tests: Recent Labs  Lab 05/30/19 1313  AST 25  ALT 20  ALKPHOS 43  BILITOT 0.6  PROT 7.1  ALBUMIN 3.7   No results for input(s): LIPASE, AMYLASE in the last 168 hours. No results for input(s): AMMONIA in the last 168 hours. Coagulation profile Recent Labs  Lab 05/30/19 1313  INR 1.1   COVID-19 Labs  No results for input(s): DDIMER, FERRITIN, LDH, CRP in the last 72 hours.  Lab Results  Component Value Date   SARSCOV2NAA NEGATIVE 05/30/2019   [place] NEGATIVE 08/03/2018    CBC: Recent Labs  Lab 05/28/19 1149 05/30/19 1313 05/30/19 1318  WBC 5.1 5.0  --   NEUTROABS  --  1.6*  --   HGB 10.8* 9.8* 9.9*  HCT 30.3* 28.2* 29.0*  MCV 80.8 82.0  --   PLT 278 238  --    Cardiac Enzymes: No results for input(s): CKTOTAL, CKMB, CKMBINDEX, TROPONINI in the last 168 hours. BNP (last 3 results) No results for input(s): PROBNP in the last 8760 hours. CBG: Recent Labs  Lab 05/30/19 1745 05/30/19 2250 05/31/19 0630  GLUCAP 109* 221* 160*   D-Dimer: No results for input(s): DDIMER in the last 72 hours. Hgb A1c: Recent Labs    05/30/19 1313  HGBA1C 7.3*   Lipid Profile: Recent Labs    05/31/19 0429  CHOL 189  HDL 43  LDLCALC 114*  TRIG 162*  CHOLHDL 4.4   Thyroid function studies: No results for input(s): TSH, T4TOTAL, T3FREE, THYROIDAB in the last 72 hours.  Invalid input(s): FREET3 Anemia work up: No results for input(s): VITAMINB12, FOLATE, FERRITIN, TIBC, IRON, RETICCTPCT in the last 72 hours. Sepsis Labs: Recent Labs  Lab 05/28/19 1149 05/30/19 1313  WBC 5.1 5.0    Microbiology Recent Results (from the past 240 hour(s))  Respiratory Panel by RT PCR (Flu A&B, Covid) - Nasopharyngeal Swab     Status: None   Collection Time: 05/30/19  3:08 PM   Specimen: Nasopharyngeal Swab  Result Value Ref Range Status   SARS Coronavirus 2 by RT PCR NEGATIVE NEGATIVE Final    Comment: (NOTE) SARS-CoV-2 target nucleic acids are NOT DETECTED. The SARS-CoV-2 RNA is generally detectable in upper respiratoy specimens during the acute phase of infection. The lowest concentration of SARS-CoV-2 viral copies this assay can detect is 131 copies/mL. A negative result does not preclude SARS-Cov-2 infection and should not be used as the sole basis for treatment or other patient management decisions. A negative result may occur with  improper specimen collection/handling, submission of specimen other than nasopharyngeal swab, presence of viral mutation(s) within the areas targeted by this assay, and inadequate number of viral copies (<131 copies/mL). A negative result must be combined with clinical observations, patient history, and epidemiological information.  The expected result is Negative. Fact Sheet for Patients:  PinkCheek.be Fact Sheet for Healthcare Providers:  GravelBags.it This test is not yet ap proved or cleared by the [place] FDA and  has been authorized for detection and/or diagnosis of SARS-CoV-2 by FDA under an Emergency Use Authorization (EUA). This EUA will remain  in effect (meaning this test can be used) for the duration of the COVID-19 declaration under Section 564(b)(1) of the Act, 21 U.S.C. section 360bbb-3(b)(1), unless the authorization is terminated or revoked sooner.    Influenza A by PCR NEGATIVE NEGATIVE Final   Influenza B by PCR NEGATIVE NEGATIVE Final    Comment: (NOTE) The Xpert Xpress SARS-CoV-2/FLU/RSV assay is intended as an aid in  the diagnosis of influenza from  Nasopharyngeal swab specimens and  should not be used as a sole basis for treatment. Nasal washings and  aspirates are unacceptable for Xpert Xpress SARS-CoV-2/FLU/RSV  testing. Fact Sheet for Patients: PinkCheek.be Fact Sheet for Healthcare Providers: GravelBags.it This test is not yet approved or cleared by the [place] FDA and  has been authorized for detection and/or diagnosis of SARS-CoV-2 by  FDA under an Emergency Use Authorization (EUA). This EUA will remain  in effect (meaning this test can be used) for the duration of the  Covid-19 declaration under Section 564(b)(1) of the Act, 21  U.S.C. section 360bbb-3(b)(1), unless the authorization is  terminated or revoked. Performed at [facility], [place] [address] [place], [place] [address]      Medications:   . aspirin  81 mg Oral Daily  . atorvastatin  80 mg Oral QHS  . budesonide  2 mL Inhalation QHS  . cholecalciferol  1,000 Units Oral Daily  . enzalutamide  160 mg Oral q1800  . ferrous sulfate  325 mg Oral Q M,W,F  . gabapentin  100 mg Oral BID  . heparin  5,000 Units Subcutaneous Q12H  . insulin aspart  0-9 Units Subcutaneous TID WC  . lidocaine  1 patch Transdermal Q24H  . primidone  100 mg Oral Q0600  . primidone  150 mg Oral QHS  . sodium chloride flush  3 mL Intravenous Once   Continuous Infusions:    LOS: 0 days   [name]  [facility]  05/31/2019, 8:47 AM

## 2019-06-01 ENCOUNTER — Other Ambulatory Visit: Payer: Self-pay

## 2019-06-01 ENCOUNTER — Encounter (HOSPITAL_COMMUNITY): Payer: Self-pay | Admitting: Internal Medicine

## 2019-06-01 DIAGNOSIS — N1831 Chronic kidney disease, stage 3a: Secondary | ICD-10-CM | POA: Diagnosis not present

## 2019-06-01 DIAGNOSIS — R531 Weakness: Secondary | ICD-10-CM | POA: Diagnosis not present

## 2019-06-01 DIAGNOSIS — G459 Transient cerebral ischemic attack, unspecified: Secondary | ICD-10-CM

## 2019-06-01 DIAGNOSIS — I10 Essential (primary) hypertension: Secondary | ICD-10-CM | POA: Diagnosis not present

## 2019-06-01 HISTORY — DX: Transient cerebral ischemic attack, unspecified: G45.9

## 2019-06-01 LAB — GLUCOSE, CAPILLARY: Glucose-Capillary: 191 mg/dL — ABNORMAL HIGH (ref 70–99)

## 2019-06-01 MED ORDER — CLOPIDOGREL BISULFATE 75 MG PO TABS: 75.0000 mg | ORAL_TABLET | Freq: Every day | ORAL | 6 refills | Status: AC

## 2019-06-01 NOTE — Discharge Summary (Addendum)
Physician Discharge Summary  Daniel Odom CHE:[id number] DOB: 1935-05-21 DOA: 05/30/2019  PCP: [facility]  Admit date: 05/30/2019 Discharge date: 06/01/2019  Admitted From: Home Disposition:  Home  Recommendations for Outpatient Follow-up:  1. Follow up with PCP in 1-2 weeks 2. Please obtain BMP/CBC in one week   Home Health:Yes Equipment/Devices:None  Discharge Condition:stable CODE STATUS:Full Diet recommendation: Heart Healthy   Brief/Interim Summary:  84 y.o. male past medical history of essential hypertension diabetes mellitus chronic kidney disease comes into the ED with acute left-sided weakness and speech difficulty that started when he woke up in the morning  Discharge Diagnoses:  Principal Problem:   Left-sided weakness Active Problems:   Diabetes mellitus type 2 in obese ([place])   Essential hypertension   CKD (chronic kidney disease) stage 3, GFR 30-59 ml/min   Anemia associated with chronic renal failure   Morbid obesity with body mass index of 40.0-49.9 (HCC)  Left-sided weakness leg greater than arm due to TIA: CT of the head showed no acute findings neurology was consulted, MRI of the brain showed no acute stroke MRI of the head and neck showed no large vessel occlusion 2D echo showed an EF of 55% carotid Doppler showed no significant ICA stenosis he was started on aspirin and Plavix on admission. Physical therapy evaluated the patient and recommended home health PT. He will go home on aspirin and Plavix weeks and continue Plavix alone.  Chronic kidney disease stage IIIa: Creatinine remained at baseline change made.  Essential hypertension: Blood pressure medications were held on admission he will resume them as an outpatient.  Anemia of chronic disease due to renal failure.  Morbid obesity counseling.  Discharge Instructions  Discharge Instructions    Ambulatory referral to Neurology   Complete by: As directed    Follow up with stroke clinic  NP ([name] or [name], if both not available, consider [name], or [name]) at [facility] in about 4 weeks. Thanks.   Diet - low sodium heart healthy   Complete by: As directed    Increase activity slowly   Complete by: As directed      Allergies as of 06/01/2019      Reactions   Metformin And Related Hives   Penicillins Hives   Did it involve swelling of the face/tongue/throat, SOB, or low BP? Yes Did it involve sudden or severe rash/hives, skin peeling, or any reaction on the inside of your mouth or nose? No Did you need to seek medical attention at a hospital or doctor's office? Yes When did it last happen?1968 If all above answers are "NO", may proceed with cephalosporin use.      Medication List    TAKE these medications   aspirin 81 MG chewable tablet Chew 81 mg by mouth daily.   atorvastatin 80 MG tablet Commonly known as: LIPITOR Take 40 mg by mouth at bedtime.   Calcium 600+D3 600-400 MG-UNIT Tabs Generic drug: Calcium Carbonate-Vitamin D3 Take 1 tablet by mouth in the morning and at bedtime.   clopidogrel 75 MG tablet Commonly known as: PLAVIX Take 1 tablet (75 mg total) by mouth daily. Start taking on: Jun 02, 2019   cyclobenzaprine 10 MG tablet Commonly known as: FLEXERIL Take 10 mg by mouth 3 (three) times daily as needed for muscle spasms.   docusate sodium 100 MG capsule Commonly known as: COLACE Take 100 mg by mouth in the morning and at bedtime.   ferrous sulfate 325 (65 FE) MG tablet Take  325 mg by mouth every Monday, Wednesday, and Friday.   fluticasone 50 MCG/ACT nasal spray Commonly known as: FLONASE Place 1 spray into both nostrils daily as needed for allergies or rhinitis.   glucose 4 GM chewable tablet Chew 1 tablet by mouth as needed for low blood sugar.   lisinopril-hydrochlorothiazide 20-12.5 MG tablet Commonly known as: ZESTORETIC Take 1 tablet by mouth daily.   mometasone 220 MCG/INH inhaler Commonly known as:  ASMANEX Inhale 2 puffs into the lungs at bedtime.   NovoLIN 70/30 (70-30) 100 UNIT/ML injection Generic drug: insulin NPH-regular Human Inject 10-70 Units into the skin See admin instructions. Inject 70 units into the skin in the morning and 10-25 units in the evening   polyethylene glycol 17 g packet Commonly known as: MIRALAX / GLYCOLAX Take 17 g by mouth daily as needed for mild constipation or moderate constipation.   PRESCRIPTION MEDICATION CPAP- At bedtime   primidone 50 MG tablet Commonly known as: MYSOLINE Take 100-150 mg by mouth See admin instructions. Take 100 mg by mouth in the morning and 150 mg at bedtime   ProAir HFA 108 (90 Base) MCG/ACT inhaler Generic drug: albuterol Inhale 2 puffs into the lungs every 6 (six) hours as needed for wheezing or shortness of breath.   Refresh Plus 0.5 % Soln Generic drug: Carboxymethylcellulose Sod PF Place 1 drop into both eyes in the morning, at noon, and at bedtime.   Vitamin D-3 25 MCG (1000 UT) Caps Take 1,000 Units by mouth daily.   Xtandi 40 MG capsule Generic drug: enzalutamide Take 160 mg by mouth daily at 6 PM.      Follow-up Information    [facility]. Schedule an appointment as soon as possible for a visit in 4 week(s).   Specialty: Neurology Contact information: [place] [phone]         Allergies  Allergen Reactions  . Metformin And Related Hives  . Penicillins Hives    Did it involve swelling of the face/tongue/throat, SOB, or low BP? Yes Did it involve sudden or severe rash/hives, skin peeling, or any reaction on the inside of your mouth or nose? No Did you need to seek medical attention at a hospital or doctor's office? Yes When did it last happen?1968 If all above answers are "NO", may proceed with cephalosporin use.     Consultations:  Neurology   Procedures/Studies: DG Chest 2 View  Result Date:  05/28/2019 CLINICAL DATA:  Chest pain. EXAM: CHEST - 2 VIEW COMPARISON:  None. FINDINGS: The heart size and mediastinal contours are within normal limits. Both lungs are clear. No pneumothorax or pleural effusion is noted. The visualized skeletal structures are unremarkable. IMPRESSION: No active cardiopulmonary disease. Electronically Signed   By: Marijo Conception M.D.   On: 05/28/2019 11:58   DG Abd 1 View  Result Date: 05/30/2019 CLINICAL DATA:  Stroke symptoms. Middle implant. Clearance for MRI. EXAM: ABDOMEN - 1 VIEW COMPARISON:  CT abdomen dated 08/03/2018. Plain film of the abdomen dated 08/14/2018. FINDINGS: Surgical sutures again appreciated within the upper outer quadrant. Brachytherapy seeds noted within the lower pelvis. No additional foreign body identified on this plain film examination, although the most peripheral portions of the abdomen are excluded. Overall bowel gas pattern is nonobstructive. No evidence of soft tissue mass or abnormal fluid collection. No evidence of free intraperitoneal air. No acute appearing osseous abnormality. IMPRESSION: 1. Surgical sutures again appreciated within the upper outer quadrant.  Brachytherapy seeds noted within the lower pelvis. 2. No additional foreign body is identified, although the most peripheral portions of the abdomen are excluded on this plain film exam. 3. Nonobstructive bowel gas pattern. Electronically Signed   By: Franki Cabot M.D.   On: 05/30/2019 13:03   MR ANGIO HEAD WO CONTRAST  Result Date: 05/30/2019 CLINICAL DATA:  Stroke, follow-up. Additional provided: Left-sided weakness and speech difficulty. EXAM: MRI HEAD WITHOUT CONTRAST MRA HEAD WITHOUT CONTRAST TECHNIQUE: Multiplanar, multiecho pulse sequences of the brain and surrounding structures were obtained without intravenous contrast. Angiographic images of the head were obtained using MRA technique without contrast. COMPARISON:  Noncontrast head CT performed earlier the same day  05/30/2019. FINDINGS: MRI HEAD FINDINGS Brain: Mild intermittent motion degradation. There is mild scattered T2/FLAIR hyperintensity within the cerebral white matter which is nonspecific, but consistent with chronic small vessel ischemic disease. Mild generalized parenchymal atrophy. There is no acute infarct. No evidence of intracranial mass. No chronic intracranial blood products. No extra-axial fluid collection. No midline shift. Vascular: Reported below. Skull and upper cervical spine: No focal marrow lesion. Sinuses/Orbits: Visualized orbits show no acute finding. Diffuse mild paranasal sinus mucosal thickening. Small left sphenoid sinus air-fluid level. Trace fluid within bilateral mastoid air cells. MRA HEAD FINDINGS The intracranial internal carotid arteries are patent without significant stenosis. The M1 middle cerebral arteries are patent without significant stenosis. No M2 proximal branch occlusion or high-grade proximal stenosis is identified. The anterior cerebral arteries are patent without significant proximal stenosis. No intracranial aneurysm is identified. There are inferiorly projecting infundibula arising from the supraclinoid internal carotid arteries bilaterally (series 352, image 8). The intracranial vertebral arteries are patent without significant stenosis, as is the basilar artery. The bilateral posterior cerebral arteries are patent without significant proximal stenosis. Posterior communicating arteries are present bilaterally. IMPRESSION: MRI brain: 1. Mildly motion degraded examination. 2. No evidence of acute intracranial abnormality, including acute infarction. 3. Mild generalized parenchymal atrophy and chronic small vessel ischemic disease. 4. Mild paranasal sinus mucosal thickening with small left sphenoid sinus air-fluid level. Correlate for acute sinusitis. 5. Trace bilateral mastoid effusions. MRA head: No intracranial large vessel occlusion or proximal high-grade arterial  stenosis Electronically Signed   By: Kellie Simmering DO   On: 05/30/2019 16:05   MR BRAIN WO CONTRAST  Result Date: 05/30/2019 CLINICAL DATA:  Stroke, follow-up. Additional provided: Left-sided weakness and speech difficulty. EXAM: MRI HEAD WITHOUT CONTRAST MRA HEAD WITHOUT CONTRAST TECHNIQUE: Multiplanar, multiecho pulse sequences of the brain and surrounding structures were obtained without intravenous contrast. Angiographic images of the head were obtained using MRA technique without contrast. COMPARISON:  Noncontrast head CT performed earlier the same day 05/30/2019. FINDINGS: MRI HEAD FINDINGS Brain: Mild intermittent motion degradation. There is mild scattered T2/FLAIR hyperintensity within the cerebral white matter which is nonspecific, but consistent with chronic small vessel ischemic disease. Mild generalized parenchymal atrophy. There is no acute infarct. No evidence of intracranial mass. No chronic intracranial blood products. No extra-axial fluid collection. No midline shift. Vascular: Reported below. Skull and upper cervical spine: No focal marrow lesion. Sinuses/Orbits: Visualized orbits show no acute finding. Diffuse mild paranasal sinus mucosal thickening. Small left sphenoid sinus air-fluid level. Trace fluid within bilateral mastoid air cells. MRA HEAD FINDINGS The intracranial internal carotid arteries are patent without significant stenosis. The M1 middle cerebral arteries are patent without significant stenosis. No M2 proximal branch occlusion or high-grade proximal stenosis is identified. The anterior cerebral arteries are patent without significant proximal stenosis. No  intracranial aneurysm is identified. There are inferiorly projecting infundibula arising from the supraclinoid internal carotid arteries bilaterally (series 352, image 8). The intracranial vertebral arteries are patent without significant stenosis, as is the basilar artery. The bilateral posterior cerebral arteries are patent  without significant proximal stenosis. Posterior communicating arteries are present bilaterally. IMPRESSION: MRI brain: 1. Mildly motion degraded examination. 2. No evidence of acute intracranial abnormality, including acute infarction. 3. Mild generalized parenchymal atrophy and chronic small vessel ischemic disease. 4. Mild paranasal sinus mucosal thickening with small left sphenoid sinus air-fluid level. Correlate for acute sinusitis. 5. Trace bilateral mastoid effusions. MRA head: No intracranial large vessel occlusion or proximal high-grade arterial stenosis Electronically Signed   By: Kellie Simmering DO   On: 05/30/2019 16:05   DG CHEST PORT 1 VIEW  Result Date: 05/30/2019 CLINICAL DATA:  Stroke symptoms. Clearance for MRI. EXAM: PORTABLE CHEST 1 VIEW COMPARISON:  Chest x-ray dated 05/28/2019. FINDINGS: Heart size and mediastinal contours are within normal limits. Lungs are clear. No pleural effusion is seen. Osseous structures about the chest are unremarkable. IMPRESSION: No active disease. No evidence of pneumonia or pulmonary edema. Electronically Signed   By: Franki Cabot M.D.   On: 05/30/2019 13:03   ECHOCARDIOGRAM COMPLETE  Result Date: 05/31/2019    ECHOCARDIOGRAM REPORT   Patient Name:   AYUB Moor Date of Exam: 05/31/2019 Medical Rec #:  [id number]    Height:       67.0 in Accession #:    [id number]   Weight:       270.0 lb Date of Birth:  Feb 09, 1935     BSA:          2.297 m Patient Age:    73 years     BP:           135/65 mmHg Patient Gender: M            HR:           84 bpm. Exam Location:  Inpatient Procedure: 2D Echo and Intracardiac Opacification Agent Indications:    TIA 435.9 / G45.9  History:        Patient has no prior history of Echocardiogram examinations.                 Risk Factors:Hypertension and Diabetes. Chronic kidney disease.                 History of cancer.  Sonographer:    Darlina Sicilian RDCS Referring Phys: [id number] [place]  1. Left ventricular ejection  fraction, by estimation, is 55%. The left ventricle has normal function. Difficult study for wall motion even with Definity. There appears to be septal-lateral dyssynchrony consistent with LBBB, but overall EF appears to be in normal range. There is mild left ventricular hypertrophy. Left ventricular diastolic parameters are consistent with Grade I diastolic dysfunction (impaired relaxation).  2. Right ventricular systolic function is normal. The right ventricular size is normal. Tricuspid regurgitation signal is inadequate for assessing PA pressure.  3. The mitral valve is normal in structure. No evidence of mitral valve regurgitation.  4. The aortic valve is tricuspid. Aortic valve regurgitation is not visualized. Mild aortic valve sclerosis is present, with no evidence of aortic valve stenosis.  5. IVC not visualized. FINDINGS  Left Ventricle: Left ventricular ejection fraction, by estimation, is 55%. The left ventricle has normal function. The left ventricle has no regional wall motion abnormalities. Definity contrast agent was given IV to delineate  the left ventricular endocardial borders. The left ventricular internal cavity size was normal in size. There is mild left ventricular hypertrophy. Left ventricular diastolic parameters are consistent with Grade I diastolic dysfunction (impaired relaxation). Right Ventricle: The right ventricular size is normal. No increase in right ventricular wall thickness. Right ventricular systolic function is normal. Tricuspid regurgitation signal is inadequate for assessing PA pressure. Left Atrium: Left atrial size was normal in size. Right Atrium: Right atrial size was normal in size. Pericardium: There is no evidence of pericardial effusion. Mitral Valve: The mitral valve is normal in structure. No evidence of mitral valve regurgitation. Tricuspid Valve: The tricuspid valve is normal in structure. Tricuspid valve regurgitation is not demonstrated. Aortic Valve: The aortic  valve is tricuspid. Aortic valve regurgitation is not visualized. Mild aortic valve sclerosis is present, with no evidence of aortic valve stenosis. Pulmonic Valve: The pulmonic valve was normal in structure. Pulmonic valve regurgitation is not visualized. Aorta: The aortic root is normal in size and structure. Venous: The inferior vena cava was not well visualized. IAS/Shunts: No atrial level shunt detected by color flow Doppler.  LEFT VENTRICLE PLAX 2D LVIDd:         4.40 cm LVIDs:         3.80 cm LV PW:         1.10 cm LV IVS:        1.30 cm LVOT diam:     2.10 cm LV SV:         54 LV SV Index:   24 LVOT Area:     3.46 cm  LV Volumes (MOD) LV vol d, MOD A2C: 173.0 ml LV vol d, MOD A4C: 171.0 ml LV vol s, MOD A2C: 76.9 ml LV vol s, MOD A4C: 93.6 ml LV SV MOD A2C:     96.1 ml LV SV MOD A4C:     171.0 ml LV SV MOD BP:      83.4 ml RIGHT VENTRICLE TAPSE (M-mode): 2.5 cm LEFT ATRIUM             Index LA diam:        2.90 cm 1.26 cm/m LA Vol (A2C):   70.9 ml 30.86 ml/m LA Vol (A4C):   40.1 ml 17.45 ml/m LA Biplane Vol: 53.2 ml 23.16 ml/m  AORTIC VALVE LVOT Vmax:   89.20 cm/s LVOT Vmean:  57.100 cm/s LVOT VTI:    0.156 m  AORTA Ao Root diam: 3.50 cm MITRAL VALVE MV Area (PHT): 6.32 cm    SHUNTS MV Decel Time: 120 msec    Systemic VTI:  0.16 m MV E velocity: 93.00 cm/s  Systemic Diam: 2.10 cm Loralie Champagne MD Electronically signed by Loralie Champagne MD Signature Date/Time: 05/31/2019/4:52:58 PM    Final    CT HEAD CODE STROKE WO CONTRAST  Result Date: 05/30/2019 CLINICAL DATA:  Code stroke.  Left-sided deficits EXAM: CT HEAD WITHOUT CONTRAST TECHNIQUE: Contiguous axial images were obtained from the base of the skull through the vertex without intravenous contrast. COMPARISON:  None. FINDINGS: Brain: There is no acute intracranial hemorrhage, mass effect, or edema. Gray-white differentiation is preserved. Ventricles and sulci are within normal limits in size and configuration. Patchy hypoattenuation in the  supratentorial white matter is nonspecific but may reflect mild chronic microvascular ischemic changes. There is no extra-axial fluid collection. Vascular: No hyperdense vessel. There is intracranial atherosclerotic calcification at the skull base. Skull: Unremarkable Sinuses/Orbits: Patchy mucosal thickening.  Orbits are unremarkable. Other: Mastoid air cells  are clear. Turbinate hypertrophy with probable superimposed lobular nasal cavity soft tissue. ASPECTS ([place] Stroke Program Early CT Score) - Ganglionic level infarction (caudate, lentiform nuclei, internal capsule, insula, M1-M3 cortex): 7 - Supraganglionic infarction (M4-M6 cortex): 3 Total score (0-10 with 10 being normal): 10 IMPRESSION: No acute intracranial hemorrhage or evidence of acute infarction. ASPECT score is 10. Chronic microvascular ischemic changes. These results were communicated to Dr. Cheral Marker at 12:34 pmon 5/5/2021by text page via the [facility] messaging system. Electronically Signed   By: Macy Mis M.D.   On: 05/30/2019 12:36   VAS US CAROTID  Result Date: 05/31/2019 Carotid Arterial Duplex Study Indications:   TIA. Risk Factors:  Hypertension, Diabetes. Other Factors: MRI results No evidence of acute intracranial abnormality,                including acute                infarction. Performing Technologist: June Leap RDMS, RVT  Examination Guidelines: A complete evaluation includes B-mode imaging, spectral Doppler, color Doppler, and power Doppler as needed of all accessible portions of each vessel. Bilateral testing is considered an integral part of a complete examination. Limited examinations for reoccurring indications may be performed as noted.  Right Carotid Findings: +----------+--------+--------+--------+------------------+--------+           PSV cm/sEDV cm/sStenosisPlaque DescriptionComments +----------+--------+--------+--------+------------------+--------+ CCA Prox  57      14              heterogenous                +----------+--------+--------+--------+------------------+--------+ CCA Distal48      11                                         +----------+--------+--------+--------+------------------+--------+ ICA Prox  63      14      1-39%   heterogenous               +----------+--------+--------+--------+------------------+--------+ ICA Distal118     27                                         +----------+--------+--------+--------+------------------+--------+ ECA       53      9                                          +----------+--------+--------+--------+------------------+--------+ +----------+--------+-------+----------------+-------------------+           PSV cm/sEDV cmsDescribe        Arm Pressure (mmHG) +----------+--------+-------+----------------+-------------------+ [id number]            Multiphasic, WNL                    +----------+--------+-------+----------------+-------------------+ +---------+--------+--+--------+--+---------+ VertebralPSV cm/s84EDV cm/s17Antegrade +---------+--------+--+--------+--+---------+  Left Carotid Findings: +----------+--------+--------+--------+------------------+--------+           PSV cm/sEDV cm/sStenosisPlaque DescriptionComments +----------+--------+--------+--------+------------------+--------+ CCA Prox  71      17                                         +----------+--------+--------+--------+------------------+--------+ CCA Distal65  21                                         +----------+--------+--------+--------+------------------+--------+ ICA Prox  96      25      1-39%   heterogenous               +----------+--------+--------+--------+------------------+--------+ ICA Distal94      27                                         +----------+--------+--------+--------+------------------+--------+ ECA       112     17                                          +----------+--------+--------+--------+------------------+--------+ +----------+--------+--------+----------------+-------------------+           PSV cm/sEDV cm/sDescribe        Arm Pressure (mmHG) +----------+--------+--------+----------------+-------------------+ [id number]             Multiphasic, WNL                    +----------+--------+--------+----------------+-------------------+ +---------+--------+--+--------+--+---------+ VertebralPSV cm/s43EDV cm/s12Antegrade +---------+--------+--+--------+--+---------+   Summary: Right Carotid: Velocities in the right ICA are consistent with a 1-39% stenosis. Left Carotid: Velocities in the left ICA are consistent with a 1-39% stenosis.  *See table(s) above for measurements and observations.     Preliminary     (Echo, Carotid, EGD, Colonoscopy, ERCP)    Subjective: No complains  Discharge Exam: Vitals:   06/01/19 0656 06/01/19 0914  BP: (!) 142/77 136/74  Pulse: 93 78  Resp: 19 16  Temp: 98.7 F (37.1 C) (!) 97.5 F (36.4 C)  SpO2:  100%   Vitals:   05/31/19 2355 06/01/19 0334 06/01/19 0656 06/01/19 0914  BP: (!) 142/78 (!) 142/77 (!) 142/77 136/74  Pulse: 89 93 93 78  Resp:  19 19 16   Temp: 98.1 F (36.7 C) 98.7 F (37.1 C) 98.7 F (37.1 C) (!) 97.5 F (36.4 C)  TempSrc: Oral Oral Oral Oral  SpO2: 99% 99%  100%  Weight:   121.5 kg   Height:   5\' 7"  (1.702 m)     General: Pt is alert, awake, not in acute distress Cardiovascular: RRR, S1/S2 +, no rubs, no gallops Respiratory: CTA bilaterally, no wheezing, no rhonchi Abdominal: Soft, NT, ND, bowel sounds + Extremities: no edema, no cyanosis    The results of significant diagnostics from this hospitalization (including imaging, microbiology, ancillary and laboratory) are listed below for reference.     Microbiology: Recent Results (from the past 240 hour(s))  Respiratory Panel by RT PCR (Flu A&B, Covid) - Nasopharyngeal Swab     Status: None    Collection Time: 05/30/19  3:08 PM   Specimen: Nasopharyngeal Swab  Result Value Ref Range Status   SARS Coronavirus 2 by RT PCR NEGATIVE NEGATIVE Final    Comment: (NOTE) SARS-CoV-2 target nucleic acids are NOT DETECTED. The SARS-CoV-2 RNA is generally detectable in upper respiratoy specimens during the acute phase of infection. The lowest concentration of SARS-CoV-2 viral copies this assay can detect is 131 copies/mL. A negative result does not preclude SARS-Cov-2 infection and should not be used as the  sole basis for treatment or other patient management decisions. A negative result may occur with  improper specimen collection/handling, submission of specimen other than nasopharyngeal swab, presence of viral mutation(s) within the areas targeted by this assay, and inadequate number of viral copies (<131 copies/mL). A negative result must be combined with clinical observations, patient history, and epidemiological information. The expected result is Negative. Fact Sheet for Patients:  PinkCheek.be Fact Sheet for Healthcare Providers:  GravelBags.it This test is not yet ap proved or cleared by the [place] FDA and  has been authorized for detection and/or diagnosis of SARS-CoV-2 by FDA under an Emergency Use Authorization (EUA). This EUA will remain  in effect (meaning this test can be used) for the duration of the COVID-19 declaration under Section 564(b)(1) of the Act, 21 U.S.C. section 360bbb-3(b)(1), unless the authorization is terminated or revoked sooner.    Influenza A by PCR NEGATIVE NEGATIVE Final   Influenza B by PCR NEGATIVE NEGATIVE Final    Comment: (NOTE) The Xpert Xpress SARS-CoV-2/FLU/RSV assay is intended as an aid in  the diagnosis of influenza from Nasopharyngeal swab specimens and  should not be used as a sole basis for treatment. Nasal washings and  aspirates are unacceptable for Xpert Xpress  SARS-CoV-2/FLU/RSV  testing. Fact Sheet for Patients: PinkCheek.be Fact Sheet for Healthcare Providers: GravelBags.it This test is not yet approved or cleared by the [place] FDA and  has been authorized for detection and/or diagnosis of SARS-CoV-2 by  FDA under an Emergency Use Authorization (EUA). This EUA will remain  in effect (meaning this test can be used) for the duration of the  Covid-19 declaration under Section 564(b)(1) of the Act, 21  U.S.C. section 360bbb-3(b)(1), unless the authorization is  terminated or revoked. Performed at [facility], [place] [address] [place], [place] [address]      Labs: BNP (last 3 results) No results for input(s): BNP in the last 8760 hours. Basic Metabolic Panel: Recent Labs  Lab 05/28/19 1149 05/30/19 1313 05/30/19 1318  NA 139 140 139  K 4.4 3.7 3.6  CL 104 103 105  CO2 24 23  --   GLUCOSE 155* 145* 137*  BUN 34* 38* 36*  CREATININE 2.11* 2.25* 2.10*  CALCIUM 9.6 9.5  --    Liver Function Tests: Recent Labs  Lab 05/30/19 1313  AST 25  ALT 20  ALKPHOS 43  BILITOT 0.6  PROT 7.1  ALBUMIN 3.7   No results for input(s): LIPASE, AMYLASE in the last 168 hours. No results for input(s): AMMONIA in the last 168 hours. CBC: Recent Labs  Lab 05/28/19 1149 05/30/19 1313 05/30/19 1318  WBC 5.1 5.0  --   NEUTROABS  --  1.6*  --   HGB 10.8* 9.8* 9.9*  HCT 30.3* 28.2* 29.0*  MCV 80.8 82.0  --   PLT 278 238  --    Cardiac Enzymes: No results for input(s): CKTOTAL, CKMB, CKMBINDEX, TROPONINI in the last 168 hours. BNP: Invalid input(s): POCBNP CBG: Recent Labs  Lab 05/31/19 0630 05/31/19 1156 05/31/19 1630 05/31/19 2111 06/01/19 0615  GLUCAP 160* 240* 214* 219* 191*   D-Dimer No results for input(s): DDIMER in the last 72 hours. Hgb A1c Recent Labs    05/30/19 1313  HGBA1C 7.3*   Lipid Profile Recent Labs    05/31/19 0429  CHOL 189  HDL 43   LDLCALC 114*  TRIG 162*  CHOLHDL 4.4   Thyroid function studies No results for input(s): TSH,  T4TOTAL, T3FREE, THYROIDAB in the last 72 hours.  Invalid input(s): FREET3 Anemia work up No results for input(s): VITAMINB12, FOLATE, FERRITIN, TIBC, IRON, RETICCTPCT in the last 72 hours. Urinalysis    Component Value Date/Time   COLORURINE YELLOW 08/03/2018 2313   APPEARANCEUR CLEAR 08/03/2018 2313   LABSPEC 1.019 08/03/2018 2313   PHURINE 6.0 08/03/2018 2313   GLUCOSEU NEGATIVE 08/03/2018 2313   HGBUR NEGATIVE 08/03/2018 2313   BILIRUBINUR NEGATIVE 08/03/2018 2313   KETONESUR NEGATIVE 08/03/2018 2313   PROTEINUR 30 (A) 08/03/2018 2313   NITRITE NEGATIVE 08/03/2018 2313   LEUKOCYTESUR NEGATIVE 08/03/2018 2313   Sepsis Labs Invalid input(s): PROCALCITONIN,  WBC,  LACTICIDVEN Microbiology Recent Results (from the past 240 hour(s))  Respiratory Panel by RT PCR (Flu A&B, Covid) - Nasopharyngeal Swab     Status: None   Collection Time: 05/30/19  3:08 PM   Specimen: Nasopharyngeal Swab  Result Value Ref Range Status   SARS Coronavirus 2 by RT PCR NEGATIVE NEGATIVE Final    Comment: (NOTE) SARS-CoV-2 target nucleic acids are NOT DETECTED. The SARS-CoV-2 RNA is generally detectable in upper respiratoy specimens during the acute phase of infection. The lowest concentration of SARS-CoV-2 viral copies this assay can detect is 131 copies/mL. A negative result does not preclude SARS-Cov-2 infection and should not be used as the sole basis for treatment or other patient management decisions. A negative result may occur with  improper specimen collection/handling, submission of specimen other than nasopharyngeal swab, presence of viral mutation(s) within the areas targeted by this assay, and inadequate number of viral copies (<131 copies/mL). A negative result must be combined with clinical observations, patient history, and epidemiological information. The expected result is  Negative. Fact Sheet for Patients:  PinkCheek.be Fact Sheet for Healthcare Providers:  GravelBags.it This test is not yet ap proved or cleared by the [place] FDA and  has been authorized for detection and/or diagnosis of SARS-CoV-2 by FDA under an Emergency Use Authorization (EUA). This EUA will remain  in effect (meaning this test can be used) for the duration of the COVID-19 declaration under Section 564(b)(1) of the Act, 21 U.S.C. section 360bbb-3(b)(1), unless the authorization is terminated or revoked sooner.    Influenza A by PCR NEGATIVE NEGATIVE Final   Influenza B by PCR NEGATIVE NEGATIVE Final    Comment: (NOTE) The Xpert Xpress SARS-CoV-2/FLU/RSV assay is intended as an aid in  the diagnosis of influenza from Nasopharyngeal swab specimens and  should not be used as a sole basis for treatment. Nasal washings and  aspirates are unacceptable for Xpert Xpress SARS-CoV-2/FLU/RSV  testing. Fact Sheet for Patients: PinkCheek.be Fact Sheet for Healthcare Providers: GravelBags.it This test is not yet approved or cleared by the [place] FDA and  has been authorized for detection and/or diagnosis of SARS-CoV-2 by  FDA under an Emergency Use Authorization (EUA). This EUA will remain  in effect (meaning this test can be used) for the duration of the  Covid-19 declaration under Section 564(b)(1) of the Act, 21  U.S.C. section 360bbb-3(b)(1), unless the authorization is  terminated or revoked. Performed at [facility], [place] [address], [place], [place] [address]      Time coordinating discharge: Over 30 minutes  SIGNED:   [name], MD  [facility] 06/01/2019, 9:25 AM Pager   If 7PM-7AM, please contact night-coverage [email or web address] Password [email or web address]

## 2019-06-28 ENCOUNTER — Other Ambulatory Visit: Payer: Self-pay

## 2019-06-28 ENCOUNTER — Ambulatory Visit (INDEPENDENT_AMBULATORY_CARE_PROVIDER_SITE_OTHER): Payer: Non-veteran care | Admitting: Adult Health

## 2019-06-28 ENCOUNTER — Encounter: Payer: Self-pay | Admitting: Adult Health

## 2019-06-28 VITALS — BP 135/76 | HR 91 | Ht 67.0 in | Wt 274.0 lb

## 2019-06-28 DIAGNOSIS — E785 Hyperlipidemia, unspecified: Secondary | ICD-10-CM

## 2019-06-28 DIAGNOSIS — I10 Essential (primary) hypertension: Secondary | ICD-10-CM | POA: Diagnosis not present

## 2019-06-28 DIAGNOSIS — E119 Type 2 diabetes mellitus without complications: Secondary | ICD-10-CM

## 2019-06-28 DIAGNOSIS — R2990 Unspecified symptoms and signs involving the nervous system: Secondary | ICD-10-CM | POA: Diagnosis not present

## 2019-06-28 NOTE — Progress Notes (Signed)
[facility] [address] [place]. [place] [address] [phone]       HOSPITAL FOLLOW UP NOTE  Mr. Daniel Odom Date of Birth:  1935-07-16 Medical Record Number:  [id number]   Reason for Referral:  hospital stroke follow up    SUBJECTIVE:   CHIEF COMPLAINT:  Chief Complaint  Patient presents with  . Follow-up    hospital fu, rm 9, pt states he is doing well, doing well at PT    HPI:   Mr. Daniel Odom is a 84 y.o. male with history of HTN, DB, CKD, and prostate cancer  who presented on 05/30/2019 with L sided weakness and speech difficulty likely due to right brain TIA.  Work-up was largely unremarkable without acute abnormality and only mild small vessel disease.  Recommended DAPT for 3 weeks and Plavix alone as previously on aspirin.  HTN stable.  LDL 114 and recommended continuation of atorvastatin 80 mg daily.  Uncontrolled DM with A1c 7.3.  Other stroke risk factors include advanced age and one obesity.  Evaluated by therapies with speech therapy concern of mild cognitive impairment and recommended follow-up with outpatient therapy.  No other therapies recommended and was discharged home in stable condition.  TIA - right brain TIA   Code Stroke CT head No acute abnormality. ASPECTS 10.     MRI  No acute infarct. Mild Small vessel disease. Atrophy. Sinus dz.   MRA  Unremarkable   Carotid Doppler  B ICA 1-39% stenosis, VAs antegrade   2D Echo EF 55%  LDL 114  HgbA1c 7.3  Heparin 5000 units sq tid for VTE prophylaxis  ASA 81mg  prior to admission, now on aspirin 81 mg daily and plavix 75mg  DAPT for 3 weeks and then plavix alone.  Therapy recommendations:  OP SLP, no PT   Disposition:  home   Today, 06/28/2019, Daniel Odom is being seen for hospital follow-up accompanied by his wife.  He continues to have mild left sided weakness and gait difficulty but endorses ongoing improvement.  Per wife and patient, he has had mild residual left-sided weakness  since his hospitalization.  He denies residual speech difficulty.  Continues to work with home health PT with ongoing improvement.  Completed 3 weeks DAPT and continues on Plavix alone without bleeding or bruising.  Continues on atorvastatin 80 mg daily on myalgias.  Blood pressure today 135/76.  Glucose levels stable.  History of chronic tremors with ongoing use of primidone managed by PCP.  Normal no concerns at this time.       ROS:   14 system review of systems performed and negative with exception of weakness, gait impairment, tremors  PMH:  Past Medical History:  Diagnosis Date  . Chronic kidney disease (CKD), stage III (moderate)   . Colon cancer ([place])   . Diabetes mellitus without complication ([place])   . Hypertension   . Prostate cancer ([place])     PSH:  Past Surgical History:  Procedure Laterality Date  . COLON SURGERY    . LAPAROSCOPIC LYSIS OF ADHESIONS N/A 08/08/2018   Procedure: LAPAROSCOPIC LYSIS OF ADHESIONS;  Surgeon: Kinsinger, Arta Bruce, MD;  Location: [facility];  Service: General;  Laterality: N/A;  . LAPAROTOMY N/A 08/08/2018   Procedure: Exploratory Laparotomy;  Surgeon: Kieth Brightly Arta Bruce, MD;  Location: [facility];  Service: General;  Laterality: N/A;  . LYSIS OF ADHESION N/A 08/08/2018   Procedure: Lysis of adhesions with repair of small intestine;  Surgeon: Kieth Brightly Arta Bruce, MD;  Location:  [facility];  Service: General;  Laterality: N/A;    Social History:  Social History   Socioeconomic History  . Marital status: Married    Spouse name: Not on file  . Number of children: Not on file  . Years of education: Not on file  . Highest education level: Not on file  Occupational History  . Not on file  Tobacco Use  . Smoking status: Never Smoker  . Smokeless tobacco: Never Used  Substance and Sexual Activity  . Alcohol use: Not on file  . Drug use: Not on file  . Sexual activity: Not on file  Other Topics Concern  . Not on file  Social History Narrative  .  Not on file   Social Determinants of Health   Financial Resource Strain:   . Difficulty of Paying Living Expenses:   Food Insecurity:   . Worried About Charity fundraiser in the Last Year:   . Arboriculturist in the Last Year:   Transportation Needs:   . Film/video editor (Medical):   Marland Kitchen Lack of Transportation (Non-Medical):   Physical Activity:   . Days of Exercise per Week:   . Minutes of Exercise per Session:   Stress:   . Feeling of Stress :   Social Connections:   . Frequency of Communication with Friends and Family:   . Frequency of Social Gatherings with Friends and Family:   . Attends Religious Services:   . Active Member of Clubs or Organizations:   . Attends Archivist Meetings:   Marland Kitchen Marital Status:   Intimate Partner Violence:   . Fear of Current or Ex-Partner:   . Emotionally Abused:   Marland Kitchen Physically Abused:   . Sexually Abused:     Family History:  Family History  Problem Relation Age of Onset  . Diabetes Mellitus II Brother   . Colon cancer Neg Hx     Medications:   Current Outpatient Medications on File Prior to Visit  Medication Sig Dispense Refill  . albuterol (PROAIR HFA) 108 (90 Base) MCG/ACT inhaler Inhale 2 puffs into the lungs every 6 (six) hours as needed for wheezing or shortness of breath.    Marland Kitchen aspirin 81 MG chewable tablet Chew 81 mg by mouth daily.    Marland Kitchen atorvastatin (LIPITOR) 80 MG tablet Take 40 mg by mouth at bedtime.     . Calcium Carbonate-Vitamin D3 (CALCIUM 600+D3) 600-400 MG-UNIT TABS Take 1 tablet by mouth in the morning and at bedtime.    . Carboxymethylcellulose Sod PF (REFRESH PLUS) 0.5 % SOLN Place 1 drop into both eyes in the morning, at noon, and at bedtime.     . Cholecalciferol (VITAMIN D-3) 25 MCG (1000 UT) CAPS Take 1,000 Units by mouth daily.    . clopidogrel (PLAVIX) 75 MG tablet Take 1 tablet (75 mg total) by mouth daily. 30 tablet 6  . cyclobenzaprine (FLEXERIL) 10 MG tablet Take 10 mg by mouth 3 (three)  times daily as needed for muscle spasms.    Marland Kitchen docusate sodium (COLACE) 100 MG capsule Take 100 mg by mouth in the morning and at bedtime.    . enzalutamide (XTANDI) 40 MG capsule Take 80 mg by mouth daily at 6 PM.     . ferrous sulfate 325 (65 FE) MG tablet Take 325 mg by mouth every Monday, Wednesday, and Friday.    . fluticasone (FLONASE) 50 MCG/ACT nasal spray Place 1 spray into both nostrils daily as needed  for allergies or rhinitis.    Marland Kitchen glucose 4 GM chewable tablet Chew 1 tablet by mouth as needed for low blood sugar.    . insulin NPH-regular Human (NOVOLIN 70/30) (70-30) 100 UNIT/ML injection Inject 10-70 Units into the skin See admin instructions. Inject 70 units into the skin in the morning and 10-25 units in the evening    . lisinopril-hydrochlorothiazide (ZESTORETIC) 20-12.5 MG tablet Take 1 tablet by mouth daily.    . mometasone (ASMANEX) 220 MCG/INH inhaler Inhale 2 puffs into the lungs at bedtime.    . polyethylene glycol (MIRALAX / GLYCOLAX) 17 g packet Take 17 g by mouth daily as needed for mild constipation or moderate constipation.     Marland Kitchen PRESCRIPTION MEDICATION CPAP- At bedtime    . primidone (MYSOLINE) 50 MG tablet Take 100-150 mg by mouth See admin instructions. Take 100 mg by mouth in the morning and 150 mg at bedtime     No current facility-administered medications on file prior to visit.    Allergies:   Allergies  Allergen Reactions  . Metformin And Related Hives  . Penicillins Hives    Did it involve swelling of the face/tongue/throat, SOB, or low BP? Yes Did it involve sudden or severe rash/hives, skin peeling, or any reaction on the inside of your mouth or nose? No Did you need to seek medical attention at a hospital or doctor's office? Yes When did it last happen?1968 If all above answers are "NO", may proceed with cephalosporin use.       OBJECTIVE:  Physical Exam  Vitals:   06/28/19 1401  BP: 135/76  Pulse: 91  Weight: 274 lb (124.3 kg)  Height:  5\' 7"  (1.702 m)   Body mass index is 42.91 kg/m. No exam data present  No flowsheet data found.   General: Obese pleasant elderly African-American male, seated, in no evident distress Head: head normocephalic and atraumatic.   Neck: supple with no carotid or supraclavicular bruits Cardiovascular: regular rate and rhythm, no murmurs Musculoskeletal: no deformity Skin:  no rash/petichiae Vascular:  Normal pulses all extremities   Neurologic Exam Mental Status: Awake and fully alert. No evidence of dysarthria or aphasia.  Oriented to place and time. Recent and remote memory intact. Attention span, concentration and fund of knowledge appropriate. Mood and affect appropriate.  Cranial Nerves: Pupils equal, briskly reactive to light. Extraocular movements full without nystagmus. Visual fields full to confrontation. Hearing intact. Facial sensation intact. Face, tongue, palate moves normally and symmetrically.  Motor: Normal bulk and tone. Normal strength in all tested extremity muscles. Sensory.: intact to touch , pinprick , position and vibratory sensation.  Coordination: Rapid alternating movements normal in all extremities except decreased left hand dexterity. Finger-to-nose and heel-to-shin performed accurately bilaterally.  Orbits right arm over left arm.  Action tremors L > R chronic.  No evidence of tremors at rest, cogwheel rigidity or bradykinesia Gait and Station: Arises from chair without difficulty. Stance is normal. Gait demonstrates  broad-based gait with mild imbalance and use of cane Reflexes: 1+ and symmetric. Toes downgoing.     NIHSS  0 Modified Rankin  2      ASSESSMENT: Daniel Odom is a 84 y.o. year old male presented with left-sided weakness and speech difficulty on 05/30/2019 likely due to right brain TIA. Vascular risk factors include HTN, HLD, DM and morbid obesity.  Per review of hospital admission notes, symptoms resolved but per patient and wife, symptoms did  not completely resolve during admission and  has been gradually recovering with residual decrease left hand dexterity     PLAN:  1. Strokelike episode:  -Continue to work with home health therapies for ongoing hopeful improvement -Continue clopidogrel 75 mg daily  and atorvastatin for secondary stroke prevention.  No indication for ongoing use of aspirin as prolonged DAPT not indicated.  Maintain strict control of hypertension with blood pressure goal below 130/90, diabetes with hemoglobin A1c goal below 6.5% and cholesterol with LDL cholesterol (bad cholesterol) goal below 70 mg/dL.  I also advised the patient to eat a healthy diet with plenty of whole grains, cereals, fruits and vegetables, exercise regularly with at least 30 minutes of continuous activity daily and maintain ideal body weight. 2. HTN: Stable.  Continue to follow PCP for monitoring management 3. HLD: Continuation of atorvastatin and continue to follow with PCP for prescribing, monitoring and management 4. DMII: Continue to follow with PCP for monitoring and management    Follow up in 4 months or call earlier if needed   I spent 45 minutes of face-to-face and non-face-to-face time with patient and wife.  This included previsit chart review, lab review, study review, order entry, electronic health record documentation, patient education regarding recent stroke, residual deficits, importance of managing stroke risk factors and answered all questions to patient satisfaction     Frann Rider, [facility]  [facility] [address] [place], [place] [address]  Phone [phone] Fax [phone] Note: This document was prepared with digital dictation and possible smart phrase technology. Any transcriptional errors that result from this process are unintentional.

## 2019-06-28 NOTE — Patient Instructions (Signed)
Continue to work with therapies for ongoing improvement  Continue clopidogrel 75 mg daily  and atorvastatin 80 mg daily for secondary stroke prevention  Continue to follow up with PCP regarding cholesterol, blood pressure and diabetes management   Continue to monitor blood pressure at home  Maintain strict control of hypertension with blood pressure goal below 130/90, diabetes with hemoglobin A1c goal below 6.5% and cholesterol with LDL cholesterol (bad cholesterol) goal below 70 mg/dL. I also advised the patient to eat a healthy diet with plenty of whole grains, cereals, fruits and vegetables, exercise regularly and maintain ideal body weight.  Followup in the future with me in 4 months or call earlier if needed       Thank you for coming to see [place] at [facility]. I hope we have been able to provide you high quality care today.  You may receive a patient satisfaction survey over the next few weeks. We would appreciate your feedback and comments so that we may continue to improve ourselves and the health of our patients.

## 2019-06-29 NOTE — Progress Notes (Signed)
I agree with the above plan 

## 2019-08-21 ENCOUNTER — Emergency Department (HOSPITAL_COMMUNITY): Payer: No Typology Code available for payment source

## 2019-08-21 ENCOUNTER — Other Ambulatory Visit: Payer: Self-pay

## 2019-08-21 ENCOUNTER — Observation Stay (HOSPITAL_COMMUNITY)
Admission: EM | Admit: 2019-08-21 | Discharge: 2019-08-22 | Disposition: A | Discharge status: Home / Self Care | Payer: No Typology Code available for payment source | Attending: Internal Medicine | Admitting: Internal Medicine

## 2019-08-21 ENCOUNTER — Encounter (HOSPITAL_COMMUNITY): Payer: Self-pay

## 2019-08-21 ENCOUNTER — Encounter (HOSPITAL_COMMUNITY)
Admission: EM | Disposition: A | Discharge status: Home / Self Care | Payer: Self-pay | Source: Home / Self Care | Attending: Emergency Medicine

## 2019-08-21 DIAGNOSIS — Z85038 Personal history of other malignant neoplasm of large intestine: Secondary | ICD-10-CM | POA: Diagnosis not present

## 2019-08-21 DIAGNOSIS — N1832 Chronic kidney disease, stage 3b: Secondary | ICD-10-CM | POA: Diagnosis not present

## 2019-08-21 DIAGNOSIS — Z8673 Personal history of transient ischemic attack (TIA), and cerebral infarction without residual deficits: Secondary | ICD-10-CM | POA: Diagnosis not present

## 2019-08-21 DIAGNOSIS — E1122 Type 2 diabetes mellitus with diabetic chronic kidney disease: Secondary | ICD-10-CM | POA: Diagnosis not present

## 2019-08-21 DIAGNOSIS — R55 Syncope and collapse: Secondary | ICD-10-CM | POA: Diagnosis not present

## 2019-08-21 DIAGNOSIS — I4891 Unspecified atrial fibrillation: Secondary | ICD-10-CM | POA: Insufficient documentation

## 2019-08-21 DIAGNOSIS — Z794 Long term (current) use of insulin: Secondary | ICD-10-CM | POA: Diagnosis not present

## 2019-08-21 DIAGNOSIS — Z7901 Long term (current) use of anticoagulants: Secondary | ICD-10-CM | POA: Insufficient documentation

## 2019-08-21 DIAGNOSIS — G4733 Obstructive sleep apnea (adult) (pediatric): Secondary | ICD-10-CM

## 2019-08-21 DIAGNOSIS — E669 Obesity, unspecified: Secondary | ICD-10-CM

## 2019-08-21 DIAGNOSIS — Z79899 Other long term (current) drug therapy: Secondary | ICD-10-CM | POA: Insufficient documentation

## 2019-08-21 DIAGNOSIS — I10 Essential (primary) hypertension: Secondary | ICD-10-CM

## 2019-08-21 DIAGNOSIS — N184 Chronic kidney disease, stage 4 (severe): Secondary | ICD-10-CM

## 2019-08-21 DIAGNOSIS — I441 Atrioventricular block, second degree: Secondary | ICD-10-CM | POA: Diagnosis not present

## 2019-08-21 DIAGNOSIS — Z20822 Contact with and (suspected) exposure to covid-19: Secondary | ICD-10-CM | POA: Diagnosis not present

## 2019-08-21 DIAGNOSIS — I129 Hypertensive chronic kidney disease with stage 1 through stage 4 chronic kidney disease, or unspecified chronic kidney disease: Secondary | ICD-10-CM | POA: Diagnosis not present

## 2019-08-21 DIAGNOSIS — Z950 Presence of cardiac pacemaker: Secondary | ICD-10-CM

## 2019-08-21 DIAGNOSIS — E1169 Type 2 diabetes mellitus with other specified complication: Secondary | ICD-10-CM

## 2019-08-21 DIAGNOSIS — N183 Chronic kidney disease, stage 3 unspecified: Secondary | ICD-10-CM | POA: Diagnosis present

## 2019-08-21 DIAGNOSIS — Z95818 Presence of other cardiac implants and grafts: Secondary | ICD-10-CM

## 2019-08-21 DIAGNOSIS — Z9989 Dependence on other enabling machines and devices: Secondary | ICD-10-CM

## 2019-08-21 DIAGNOSIS — Z8546 Personal history of malignant neoplasm of prostate: Secondary | ICD-10-CM | POA: Insufficient documentation

## 2019-08-21 HISTORY — DX: Presence of cardiac pacemaker: Z95.0

## 2019-08-21 HISTORY — PX: PACEMAKER IMPLANT: EP1218

## 2019-08-21 HISTORY — PX: INSERT / REPLACE / REMOVE PACEMAKER: SUR710

## 2019-08-21 LAB — COMPREHENSIVE METABOLIC PANEL
ALT: 14 U/L (ref 0–44)
AST: 18 U/L (ref 15–41)
Albumin: 3.6 g/dL (ref 3.5–5.0)
Alkaline Phosphatase: 49 U/L (ref 38–126)
Anion gap: 9 (ref 5–15)
BUN: 30 mg/dL — ABNORMAL HIGH (ref 8–23)
CO2: 26 mmol/L (ref 22–32)
Calcium: 9 mg/dL (ref 8.9–10.3)
Chloride: 103 mmol/L (ref 98–111)
Creatinine, Ser: 2.04 mg/dL — ABNORMAL HIGH (ref 0.61–1.24)
GFR calc Af Amer: 34 mL/min — ABNORMAL LOW (ref >60)
GFR calc non Af Amer: 29 mL/min — ABNORMAL LOW (ref >60)
Glucose, Bld: 159 mg/dL — ABNORMAL HIGH (ref 70–99)
Potassium: 4 mmol/L (ref 3.5–5.1)
Sodium: 138 mmol/L (ref 135–145)
Total Bilirubin: 0.4 mg/dL (ref 0.3–1.2)
Total Protein: 7.2 g/dL (ref 6.5–8.1)

## 2019-08-21 LAB — GLUCOSE, CAPILLARY
Glucose-Capillary: 177 mg/dL — ABNORMAL HIGH (ref 70–99)
Glucose-Capillary: 197 mg/dL — ABNORMAL HIGH (ref 70–99)

## 2019-08-21 LAB — IRON AND TIBC
Iron: 70 ug/dL (ref 45–182)
Saturation Ratios: 30 % (ref 17.9–39.5)
TIBC: 230 ug/dL — ABNORMAL LOW (ref 250–450)
UIBC: 160 ug/dL

## 2019-08-21 LAB — CBC WITH DIFFERENTIAL/PLATELET
Abs Immature Granulocytes: 0.01 10*3/uL (ref 0.00–0.07)
Basophils Absolute: 0 10*3/uL (ref 0.0–0.1)
Basophils Relative: 1 %
Eosinophils Absolute: 0.2 10*3/uL (ref 0.0–0.5)
Eosinophils Relative: 3 %
HCT: 25.6 % — ABNORMAL LOW (ref 39.0–52.0)
Hemoglobin: 8.9 g/dL — ABNORMAL LOW (ref 13.0–17.0)
Immature Granulocytes: 0 %
Lymphocytes Relative: 35 %
Lymphs Abs: 1.7 10*3/uL (ref 0.7–4.0)
MCH: 28.6 pg (ref 26.0–34.0)
MCHC: 34.8 g/dL (ref 30.0–36.0)
MCV: 82.3 fL (ref 80.0–100.0)
Monocytes Absolute: 0.5 10*3/uL (ref 0.1–1.0)
Monocytes Relative: 10 %
Neutro Abs: 2.4 10*3/uL (ref 1.7–7.7)
Neutrophils Relative %: 51 %
Platelets: 242 10*3/uL (ref 150–400)
RBC: 3.11 MIL/uL — ABNORMAL LOW (ref 4.22–5.81)
RDW: 14.8 % (ref 11.5–15.5)
WBC: 4.8 10*3/uL (ref 4.0–10.5)
nRBC: 0 % (ref 0.0–0.2)

## 2019-08-21 LAB — BASIC METABOLIC PANEL
Anion gap: 4 — ABNORMAL LOW (ref 5–15)
BUN: 26 mg/dL — ABNORMAL HIGH (ref 8–23)
CO2: 29 mmol/L (ref 22–32)
Calcium: 9 mg/dL (ref 8.9–10.3)
Chloride: 105 mmol/L (ref 98–111)
Creatinine, Ser: 1.81 mg/dL — ABNORMAL HIGH (ref 0.61–1.24)
GFR calc Af Amer: 39 mL/min — ABNORMAL LOW (ref >60)
GFR calc non Af Amer: 34 mL/min — ABNORMAL LOW (ref >60)
Glucose, Bld: 137 mg/dL — ABNORMAL HIGH (ref 70–99)
Potassium: 4.3 mmol/L (ref 3.5–5.1)
Sodium: 138 mmol/L (ref 135–145)

## 2019-08-21 LAB — CBG MONITORING, ED
Glucose-Capillary: 148 mg/dL — ABNORMAL HIGH (ref 70–99)
Glucose-Capillary: 118 mg/dL — ABNORMAL HIGH (ref 70–99)

## 2019-08-21 LAB — RETICULOCYTES
Immature Retic Fract: 13.3 % (ref 2.3–15.9)
RBC.: 3.21 MIL/uL — ABNORMAL LOW (ref 4.22–5.81)
Retic Count, Absolute: 36.6 10*3/uL (ref 19.0–186.0)
Retic Ct Pct: 1.1 % (ref 0.4–3.1)

## 2019-08-21 LAB — CBC
HCT: 27.5 % — ABNORMAL LOW (ref 39.0–52.0)
Hemoglobin: 9.2 g/dL — ABNORMAL LOW (ref 13.0–17.0)
MCH: 28 pg (ref 26.0–34.0)
MCHC: 33.5 g/dL (ref 30.0–36.0)
MCV: 83.6 fL (ref 80.0–100.0)
Platelets: 253 10*3/uL (ref 150–400)
RBC: 3.29 MIL/uL — ABNORMAL LOW (ref 4.22–5.81)
RDW: 14.7 % (ref 11.5–15.5)
WBC: 4.2 10*3/uL (ref 4.0–10.5)
nRBC: 0 % (ref 0.0–0.2)

## 2019-08-21 LAB — MAGNESIUM: Magnesium: 2 mg/dL (ref 1.7–2.4)

## 2019-08-21 LAB — FOLATE: Folate: 11.9 ng/mL (ref >5.9)

## 2019-08-21 LAB — BRAIN NATRIURETIC PEPTIDE: B Natriuretic Peptide: 50.5 pg/mL (ref 0.0–100.0)

## 2019-08-21 LAB — TSH: TSH: 1.214 u[IU]/mL (ref 0.350–4.500)

## 2019-08-21 LAB — VITAMIN B12: Vitamin B-12: 245 pg/mL (ref 180–914)

## 2019-08-21 LAB — FERRITIN: Ferritin: 172 ng/mL (ref 24–336)

## 2019-08-21 LAB — PROTIME-INR
INR: 1.6 — ABNORMAL HIGH (ref 0.8–1.2)
Prothrombin Time: 18.8 seconds — ABNORMAL HIGH (ref 11.4–15.2)

## 2019-08-21 LAB — SARS CORONAVIRUS 2 BY RT PCR (HOSPITAL ORDER, PERFORMED IN ~~LOC~~ HOSPITAL LAB): SARS Coronavirus 2: NEGATIVE

## 2019-08-21 LAB — TROPONIN I (HIGH SENSITIVITY)
Troponin I (High Sensitivity): 16 ng/L (ref <18)
Troponin I (High Sensitivity): 21 ng/L — ABNORMAL HIGH (ref <18)

## 2019-08-21 SURGERY — PACEMAKER IMPLANT

## 2019-08-21 MED ORDER — CHLORHEXIDINE GLUCONATE 4 % EX LIQD
60.0000 mL | Freq: Once | CUTANEOUS | Status: DC
  Filled 2019-08-21: qty 60

## 2019-08-21 MED ORDER — ONDANSETRON HCL 4 MG/2ML IJ SOLN: 4.0000 mg | Freq: Four times a day (QID) | INTRAMUSCULAR | Status: DC | PRN

## 2019-08-21 MED ORDER — ACETAMINOPHEN 650 MG RE SUPP: 650.0000 mg | Freq: Four times a day (QID) | RECTAL | Status: DC | PRN

## 2019-08-21 MED ORDER — HEPARIN (PORCINE) IN NACL 1000-0.9 UT/500ML-% IV SOLN
INTRAVENOUS | Status: AC
  Filled 2019-08-21: qty 500

## 2019-08-21 MED ORDER — LIDOCAINE HCL (PF) 1 % IJ SOLN
INTRAMUSCULAR | Status: DC | PRN
  Administered 2019-08-21: 60 mL

## 2019-08-21 MED ORDER — SODIUM CHLORIDE 0.9 % IV SOLN
250.0000 mL | INTRAVENOUS | Status: DC | PRN
  Administered 2019-08-22: 250 mL via INTRAVENOUS

## 2019-08-21 MED ORDER — MIDAZOLAM HCL 5 MG/5ML IJ SOLN
INTRAMUSCULAR | Status: DC | PRN
  Administered 2019-08-21: 1 mg via INTRAVENOUS

## 2019-08-21 MED ORDER — ONDANSETRON HCL 4 MG PO TABS: 4.0000 mg | ORAL_TABLET | Freq: Four times a day (QID) | ORAL | Status: DC | PRN

## 2019-08-21 MED ORDER — LIDOCAINE HCL (PF) 1 % IJ SOLN
INTRAMUSCULAR | Status: AC
  Filled 2019-08-21: qty 60

## 2019-08-21 MED ORDER — SENNOSIDES-DOCUSATE SODIUM 8.6-50 MG PO TABS: 1.0000 | ORAL_TABLET | Freq: Every evening | ORAL | Status: DC | PRN

## 2019-08-21 MED ORDER — INSULIN ASPART 100 UNIT/ML ~~LOC~~ SOLN
0.0000 [IU] | SUBCUTANEOUS | Status: DC
  Administered 2019-08-21: 2 [IU] via SUBCUTANEOUS
  Administered 2019-08-21 – 2019-08-22 (×2): 1 [IU] via SUBCUTANEOUS
  Administered 2019-08-22: 3 [IU] via SUBCUTANEOUS
  Administered 2019-08-22 (×2): 1 [IU] via SUBCUTANEOUS

## 2019-08-21 MED ORDER — SODIUM CHLORIDE 0.9 % IV SOLN: INTRAVENOUS | Status: DC

## 2019-08-21 MED ORDER — IOHEXOL 350 MG/ML SOLN
INTRAVENOUS | Status: DC | PRN
  Administered 2019-08-21: 15 mL

## 2019-08-21 MED ORDER — ACETAMINOPHEN 325 MG PO TABS: 650.0000 mg | ORAL_TABLET | Freq: Four times a day (QID) | ORAL | Status: DC | PRN

## 2019-08-21 MED ORDER — SODIUM CHLORIDE 0.9% FLUSH
3.0000 mL | Freq: Two times a day (BID) | INTRAVENOUS | Status: DC
  Administered 2019-08-21: 3 mL via INTRAVENOUS

## 2019-08-21 MED ORDER — FENTANYL CITRATE (PF) 100 MCG/2ML IJ SOLN
INTRAMUSCULAR | Status: DC | PRN
  Administered 2019-08-21: 25 ug via INTRAVENOUS

## 2019-08-21 MED ORDER — FENTANYL CITRATE (PF) 100 MCG/2ML IJ SOLN
INTRAMUSCULAR | Status: AC
  Filled 2019-08-21: qty 2

## 2019-08-21 MED ORDER — MIDAZOLAM HCL 5 MG/5ML IJ SOLN
INTRAMUSCULAR | Status: AC
  Filled 2019-08-21: qty 5

## 2019-08-21 MED ORDER — SODIUM CHLORIDE 0.9% FLUSH: 3.0000 mL | INTRAVENOUS | Status: DC | PRN

## 2019-08-21 MED ORDER — HEPARIN SODIUM (PORCINE) 5000 UNIT/ML IJ SOLN
5000.0000 [IU] | Freq: Three times a day (TID) | INTRAMUSCULAR | Status: DC
  Administered 2019-08-21 – 2019-08-22 (×3): 5000 [IU] via SUBCUTANEOUS
  Filled 2019-08-21 (×3): qty 1

## 2019-08-21 MED ORDER — SODIUM CHLORIDE 0.9 % IV SOLN: 250.0000 mL | INTRAVENOUS | Status: DC

## 2019-08-21 MED ORDER — SODIUM CHLORIDE 0.9 % IV SOLN
INTRAVENOUS | Status: AC
  Filled 2019-08-21: qty 2

## 2019-08-21 MED ORDER — VANCOMYCIN HCL 1500 MG/300ML IV SOLN
1500.0000 mg | Freq: Two times a day (BID) | INTRAVENOUS | Status: AC
  Administered 2019-08-22: 1500 mg via INTRAVENOUS
  Filled 2019-08-21: qty 300

## 2019-08-21 MED ORDER — HEPARIN (PORCINE) IN NACL 2-0.9 UNITS/ML
INTRAMUSCULAR | Status: AC | PRN
  Administered 2019-08-21: 500 mL

## 2019-08-21 MED ORDER — VANCOMYCIN HCL 1500 MG/300ML IV SOLN
1500.0000 mg | INTRAVENOUS | Status: AC
  Administered 2019-08-21: 1500 mg via INTRAVENOUS
  Filled 2019-08-21 (×2): qty 300

## 2019-08-21 MED ORDER — VANCOMYCIN HCL IN DEXTROSE 1-5 GM/200ML-% IV SOLN
INTRAVENOUS | Status: AC
  Filled 2019-08-21: qty 200

## 2019-08-21 MED ORDER — SODIUM CHLORIDE 0.9 % IV SOLN
80.0000 mg | INTRAVENOUS | Status: AC
  Administered 2019-08-21: 80 mg
  Filled 2019-08-21: qty 2

## 2019-08-21 MED ORDER — ALBUTEROL SULFATE (2.5 MG/3ML) 0.083% IN NEBU: 3.0000 mL | INHALATION_SOLUTION | RESPIRATORY_TRACT | Status: DC | PRN

## 2019-08-21 SURGICAL SUPPLY — 8 items
CABLE SURGICAL S-101-97-12 (CABLE) ×3 IMPLANT
HOVERMATT SINGLE USE (MISCELLANEOUS) ×2 IMPLANT
LEAD TENDRIL MRI 52CM LPA1200M (Lead) ×2 IMPLANT
LEAD TENDRIL MRI 58CM LPA1200M (Lead) ×2 IMPLANT
PACEMAKER ASSURITY DR-RF (Pacemaker) ×2 IMPLANT
PAD PRO RADIOLUCENT 2001M-C (PAD) ×3 IMPLANT
SHEATH 8FR PRELUDE SNAP 13 (SHEATH) ×4 IMPLANT
TRAY PACEMAKER INSERTION (PACKS) ×3 IMPLANT

## 2019-08-21 NOTE — Consult Note (Signed)
Cardiology Consultation: Syncope   Patient ID: Daniel Odom MRN: [id number]; DOB: 17-Mar-1935  Admit date: 08/21/2019 Date of Consult: 08/21/2019  Primary Care Provider: Clinic, [place] Cardiologist: No primary care provider on file. None [facility] Electrophysiologist:  None    Patient Profile:   Daniel Odom is a 84 y.o. male with a hx of CKD Stage 4-5, Prostate Cancer, HTN, DB, Colon Cancer s/p resction, prior stroke (6/21 on coumadin per patient and family with no coumadin recommendation at last neuro progress note 06/28/19) who is being seen today for the evaluation of syncope with Mobitz Type II HB at the request of ED Team (Dr. Dina Rich).  History of Present Illness:   Daniel Odom has a prior history of conduction disturbances NOS.  Found incidentally at time of upper and lower endoscopy.  Patient had no symptoms at that time; planned for ILR through [facility].  Patient has had two episodes of syncope; 08/18/19 and 08/20/19.  Both occurred with walking:  Felt like he must had closed his eyes, but thinks he was awake, and fell.  Couldn't move arms or legs.  Fall was broken by the couch.  In the ED, K 4.0.  Patient denies any AV nodal agents.  EKG showed sinus rhythm with Mobitz Type II HB.  Patient noted no fevers, chills, nightsweats, burning urination.  No open sores or skin wounds.  Patient is right handed and walks with a cane and walker.  Patient has had prior radiation but all of it has been abdominopelvic.     Past Medical History:  Diagnosis Date  . Chronic kidney disease (CKD), stage III (moderate)   . Colon cancer ([place])   . Diabetes mellitus without complication ([place])   . Hypertension   . Prostate cancer [facility])     Past Surgical History:  Procedure Laterality Date  . COLON SURGERY    . LAPAROSCOPIC LYSIS OF ADHESIONS N/A 08/08/2018   Procedure: LAPAROSCOPIC LYSIS OF ADHESIONS;  Surgeon: Kinsinger, Arta Bruce, MD;  Location: [place];  Service:  General;  Laterality: N/A;  . LAPAROTOMY N/A 08/08/2018   Procedure: Exploratory Laparotomy;  Surgeon: Kieth Brightly Arta Bruce, MD;  Location: [place];  Service: General;  Laterality: N/A;  . LYSIS OF ADHESION N/A 08/08/2018   Procedure: Lysis of adhesions with repair of small intestine;  Surgeon: Kieth Brightly Arta Bruce, MD;  Location: [place];  Service: General;  Laterality: N/A;     Allergies:    Allergies  Allergen Reactions  . Metformin And Related Hives  . Penicillins Hives    Did it involve swelling of the face/tongue/throat, SOB, or low BP? Yes Did it involve sudden or severe rash/hives, skin peeling, or any reaction on the inside of your mouth or nose? No Did you need to seek medical attention at a hospital or doctor's office? Yes When did it last happen?1968 If all above answers are "NO", may proceed with cephalosporin use.    Social History:   Veteran  Social History   Socioeconomic History  . Marital status: Married    Spouse name: Not on file  . Number of children: Not on file  . Years of education: Not on file  . Highest education level: Not on file  Occupational History  . Not on file  Tobacco Use  . Smoking status: Never Smoker  . Smokeless tobacco: Never Used  Substance and Sexual Activity  . Alcohol use: Not on file  . Drug use: Not on file  .  Sexual activity: Not on file  Other Topics Concern  . Not on file  Social History Narrative  . Not on file   Social Determinants of Health   Financial Resource Strain:   . Difficulty of Paying Living Expenses:   Food Insecurity:   . Worried About Charity fundraiser in the Last Year:   . Arboriculturist in the Last Year:   Transportation Needs:   . Film/video editor (Medical):   Marland Kitchen Lack of Transportation (Non-Medical):   Physical Activity:   . Days of Exercise per Week:   . Minutes of Exercise per Session:   Stress:   . Feeling of Stress :   Social Connections:   . Frequency of Communication with Friends  and Family:   . Frequency of Social Gatherings with Friends and Family:   . Attends Religious Services:   . Active Member of Clubs or Organizations:   . Attends Archivist Meetings:   Marland Kitchen Marital Status:   Intimate Partner Violence:   . Fear of Current or Ex-Partner:   . Emotionally Abused:   Marland Kitchen Physically Abused:   . Sexually Abused:     Family History:   No other members of the family needing pacemakers or having conduction issues. Family History  Problem Relation Age of Onset  . Diabetes Mellitus II Brother   . Colon cancer Neg Hx      ROS:  Please see the history of present illness.  Notes persistent left arm weakness after his stroke and notes that he has tightness in his stomach from prior cancer. All other ROS reviewed and negative.     Physical Exam/Data:   Vitals:   08/21/19 0024 08/21/19 0029 08/21/19 0130  BP:  (!) 176/84 (!) 151/67  Pulse:  54 62  Resp:  18 20  Temp:  98.1 F (36.7 C)   TempSrc:  Oral   SpO2: 100% 100% 99%   No intake or output data in the 24 hours ending 08/21/19 0219 Last 3 Weights 06/28/2019 06/01/2019 05/28/2019  Weight (lbs) 274 lb 267 lb 13.7 oz 270 lb  Weight (kg) 124.286 kg 121.5 kg 122.471 kg     There is no height or weight on file to calculate BMI.  General:  Obese male in no acute distress HEENT: normal, has hearing aid Lymph: no adenopathy Neck: no JVD Endocrine:  No thryomegaly Vascular: No carotid bruits; FA pulses 2+ bilaterally without bruits  Cardiac:  Irregularly irregular bradycardia S1, S2; RRR; no murmur  Lungs:  clear to auscultation bilaterally, no wheezing, rhonchi or rales  Abd: soft, nontender, no hepatomegaly  Ext: no edema Musculoskeletal:  No deformities, Left arm 4/5 strength Skin: warm and dry  Neuro:  CNs 2-12 intact, no focal abnormalities noted Psych:  Normal affect   EKG:  The EKG was personally reviewed and demonstrates:  Sinus rhythm with Mobitz II HB Telemetry:  Telemetry was personally  reviewed and demonstrates:  Sinus bradycardia with Mobitz II HB; as of 220 AM; Bradycardia noted with patient awake  Relevant CV Studies: Prior Echo LV and RV WNL 1. Left ventricular ejection fraction, by estimation, is 55%. The left  ventricle has normal function. Difficult study for wall motion even with  Definity. There appears to be septal-lateral dyssynchrony consistent with  LBBB, but overall EF appears to be  in normal range. There is mild left ventricular hypertrophy. Left  ventricular diastolic parameters are consistent with Grade I diastolic  dysfunction (impaired  relaxation).  2. Right ventricular systolic function is normal. The right ventricular  size is normal. Tricuspid regurgitation signal is inadequate for assessing  PA pressure.  3. The mitral valve is normal in structure. No evidence of mitral valve  regurgitation.  4. The aortic valve is tricuspid. Aortic valve regurgitation is not  visualized. Mild aortic valve sclerosis is present, with no evidence of  aortic valve stenosis.  5. IVC not visualized.   Laboratory Data:  High Sensitivity Troponin:   Recent Labs  Lab 08/21/19 0050  TROPONINIHS 16     Chemistry Recent Labs  Lab 08/21/19 0050  NA 138  K 4.0  CL 103  CO2 26  GLUCOSE 159*  BUN 30*  CREATININE 2.04*  CALCIUM 9.0  GFRNONAA 29*  GFRAA 34*  ANIONGAP 9    Recent Labs  Lab 08/21/19 0050  PROT 7.2  ALBUMIN 3.6  AST 18  ALT 14  ALKPHOS 49  BILITOT 0.4   Hematology Recent Labs  Lab 08/21/19 0050  WBC 4.8  RBC 3.11*  HGB 8.9*  HCT 25.6*  MCV 82.3  MCH 28.6  MCHC 34.8  RDW 14.8  PLT 242   Radiology/Studies:  DG Chest Portable 1 View  Result Date: 08/21/2019 CLINICAL DATA:  Recent syncopal episode EXAM: PORTABLE CHEST 1 VIEW COMPARISON:  05/30/2019 FINDINGS: Cardiac shadow is stable. Aortic calcifications are again seen. Lungs are well aerated bilaterally. Elevation of the right hemidiaphragm is again seen. No focal  infiltrate or sizable effusion is noted. IMPRESSION: No active disease. Electronically Signed   By: Inez Catalina M.D.   On: 08/21/2019 01:16   { Assessment and Plan:   1. Syncope with Mobitz Type II HB; in the setting of prior TIA - Atypical presentation of syncope (based on description), but significant concern that PPM is indication, patient on no AVN agents - K WNL, INR 1.6; Echo ordered for AM - Keep NPO for possible PPM 2. CVA- may be contributing to atypical syncope 3. CKD 3B; monitor K 4. Insuilin-dependent DM- per primary  Discussed with patient, wife, and ED Team.  For questions or updates, please contact [facility] Please consult [email or web address] for contact info under    Signed, Werner Lean, MD  08/21/2019 2:19 AM

## 2019-08-21 NOTE — Consult Note (Addendum)
Cardiology Consultation:   Patient ID: Daniel Odom MRN: [id number]; DOB: 26-May-1935  Admit date: 08/21/2019 Date of Consult: 08/21/2019  Primary Care Provider: Clinic, [place] Cardiologist: No primary care provider on file.  [facility] Electrophysiologist:  None    Patient Profile:   Daniel Odom is a 84 y.o. male with a hx of HTN, DM, CKD (III), ?stroke May 2021, h/o colon and prostate cancer who is being seen today for the evaluation of advanced heart block and syncope at the request of Dr. Cyndia Skeeters.  History of Present Illness:   [name] was recently hospitalized with stroke like symptoms May 2021, neurology noted R brain TIA with an unremarkable MRI, planned for 3 weeks of DAPT and outpt follow up, discharged 06/01/2019  He saw neurology in follow up note reports completed his DAPT > plavix alone  Home medicine includes warfarin and plavix  He came to [facility] last night with recurrent syncope, weak spells. He was found with abnormal EKG, cardiology consulted noted Mobitz II, on no nodal blocking agents and EP consulted.   He follows at the [facility] in [place], states he has been told that he likely needs a pacemaker though they were planning to have him wear a monitor first.  He reports that there is where he was placed on Warfarin, 2/2 his TIAs, no clear or reported hx of AFib  EMS records reports his presenting rhythm as AFib though in my review are sinus 1st degree AVblock 60's, his vitals  Reported not normal to high BPs HR 60's-80's and pt unable to stand for orthostatics with them too weak.    LABS K+ 4.0 Mag 2.0 BUN/Creat 30/2.04 (appears about his baseline of late) BNP 50.5 HS Trop 16 (was 19 and 15 in May)  WBC 4.8 H/H 8.9/25.6 (hgb May 10.8 > 9.8 > 9.9) Plts 242  INR 1.6  COVID neg   The patient reports that for the last few days on 2 occassions that he stood to go or use the bathroom he back very weak and collapsed.  After this  he reports the 1st time had an unusual tremor or shaking of his L arm, and both times remained profoundly weak for hours.  Last night after the last he reports so weak he could not move either leg under his own power and not for hours here did he feel like he has gotten strength back.  He reports remaining very weak in general, "I don't think I am getting enough oxygen to my brain".   While we are with his he is in SR conduction in the 60's and he reports still feeling lightheaded and weak.  He has not had any CP, palpitations or cardiac awareness of any kind, no reports of SOB.   Past Medical History:  Diagnosis Date   Chronic kidney disease (CKD), stage III (moderate)    Colon cancer ([place])    Diabetes mellitus without complication ([place])    Hypertension    Prostate cancer [facility])     Past Surgical History:  Procedure Laterality Date   COLON SURGERY     LAPAROSCOPIC LYSIS OF ADHESIONS N/A 08/08/2018   Procedure: LAPAROSCOPIC LYSIS OF ADHESIONS;  Surgeon: Kieth Brightly, Arta Bruce, MD;  Location: [place];  Service: General;  Laterality: N/A;   LAPAROTOMY N/A 08/08/2018   Procedure: Exploratory Laparotomy;  Surgeon: Mickeal Skinner, MD;  Location: [place];  Service: General;  Laterality: N/A;   LYSIS OF ADHESION N/A 08/08/2018  Procedure: Lysis of adhesions with repair of small intestine;  Surgeon: Kinsinger, Arta Bruce, MD;  Location: [place];  Service: General;  Laterality: N/A;     Home Medications:  Prior to Admission medications   Medication Sig Start Date End Date Taking? Authorizing Provider  albuterol (PROAIR HFA) 108 (90 Base) MCG/ACT inhaler Inhale 2 puffs into the lungs every 6 (six) hours as needed for wheezing or shortness of breath.   Yes [provider]  atorvastatin (LIPITOR) 80 MG tablet Take 40 mg by mouth at bedtime.    Yes [provider]  calcium citrate (CALCITRATE - DOSED IN MG ELEMENTAL CALCIUM) 950 (200 Ca) MG tablet Take 400 mg of elemental calcium by  mouth 2 (two) times daily.   Yes [provider]  Carboxymethylcellulose Sod PF (REFRESH PLUS) 0.5 % SOLN Place 1 drop into both eyes in the morning, at noon, and at bedtime.    Yes [provider]  Cholecalciferol (VITAMIN D-3) 25 MCG (1000 UT) CAPS Take 1,000 Units by mouth daily.   Yes [provider]  ferrous sulfate 325 (65 FE) MG tablet Take 325 mg by mouth every Monday, Wednesday, and Friday.   Yes [provider]  fluticasone (FLONASE) 50 MCG/ACT nasal spray Place 1 spray into both nostrils daily as needed for allergies or rhinitis.   Yes [provider]  glucose 4 GM chewable tablet Chew 1 tablet by mouth as needed for low blood sugar.   Yes [provider]  insulin NPH-regular Human (NOVOLIN 70/30) (70-30) 100 UNIT/ML injection Inject 10-70 Units into the skin See admin instructions. Inject 70 units into the skin in the morning and 25-26 units in the evening   Yes [provider]  lisinopril-hydrochlorothiazide (ZESTORETIC) 20-12.5 MG tablet Take 1 tablet by mouth daily.   Yes [provider]  mometasone (ASMANEX) 220 MCG/INH inhaler Inhale 2 puffs into the lungs at bedtime.   Yes [provider]  polyethylene glycol (MIRALAX / GLYCOLAX) 17 g packet Take 17 g by mouth daily as needed for mild constipation or moderate constipation.  02/16/17  Yes [provider]  primidone (MYSOLINE) 50 MG tablet Take 100-150 mg by mouth See admin instructions. Take 100 mg by mouth in the morning and 150 mg at bedtime   Yes [provider]  warfarin (COUMADIN) 10 MG tablet Take 10 mg by mouth daily at 6 PM.   Yes [provider]  clopidogrel (PLAVIX) 75 MG tablet Take 1 tablet (75 mg total) by mouth daily. Patient not taking: Reported on 08/21/2019 06/02/19   [name], MD  PRESCRIPTION MEDICATION CPAP- At bedtime    [provider]    Inpatient Medications: Scheduled Meds:  heparin   5,000 Units Subcutaneous Q8H   insulin aspart  0-9 Units Subcutaneous Q4H   sodium chloride flush  3 mL Intravenous Q12H   sodium chloride flush  3 mL Intravenous Q12H   Continuous Infusions:  sodium chloride     PRN Meds: sodium chloride, acetaminophen **OR** acetaminophen, albuterol, ondansetron **OR** ondansetron (ZOFRAN) IV, senna-docusate, sodium chloride flush  Allergies:    Allergies  Allergen Reactions   Metformin And Related Hives   Penicillins Hives    Did it involve swelling of the face/tongue/throat, SOB, or low BP? Yes Did it involve sudden or severe rash/hives, skin peeling, or any reaction on the inside of your mouth or nose? No Did you need to seek medical attention at a hospital or doctor's office?  Yes When did it last happen? 1968 If all above answers are "NO", may proceed with cephalosporin use.     Social History:   Social History   Socioeconomic History   Marital status: Married    Spouse name: Not on file   Number of children: Not on file   Years of education: Not on file   Highest education level: Not on file  Occupational History   Not on file  Tobacco Use   Smoking status: Never Smoker   Smokeless tobacco: Never Used  Substance and Sexual Activity   Alcohol use: Not on file   Drug use: Not on file   Sexual activity: Not on file  Other Topics Concern   Not on file  Social History Narrative   Not on file   Social Determinants of Health   Financial Resource Strain:    Difficulty of Paying Living Expenses:   Food Insecurity:    Worried About [place] in the Last Year:    Arboriculturist in the Last Year:   Transportation Needs:    Film/video editor (Medical):    Lack of Transportation (Non-Medical):   Physical Activity:    Days of Exercise per Week:    Minutes of Exercise per Session:   Stress:    Feeling of Stress :   Social Connections:    Frequency of Communication with Friends and Family:    Frequency of Social  Gatherings with Friends and Family:    Attends Religious Services:    Active Member of Clubs or Organizations:    Attends Music therapist:    Marital Status:   Intimate Partner Violence:    Fear of Current or Ex-Partner:    Emotionally Abused:    Physically Abused:    Sexually Abused:     Family History:   Family History  Problem Relation Age of Onset   Diabetes Mellitus II Brother    Colon cancer Neg Hx      ROS:  Please see the history of present illness.  All other ROS reviewed and negative.     Physical Exam/Data:   Vitals:   08/21/19 0315 08/21/19 0330 08/21/19 0345 08/21/19 0447  BP: (!) 153/70 (!) 171/70 (!) 144/61 (!) 154/51  Pulse: 59 57 54 (!) 41  Resp: 13 16 13 14   Temp:      TempSrc:      SpO2: 98% 100% 98% 100%   No intake or output data in the 24 hours ending 08/21/19 1001 Last 3 Weights 06/28/2019 06/01/2019 05/28/2019  Weight (lbs) 274 lb 267 lb 13.7 oz 270 lb  Weight (kg) 124.286 kg 121.5 kg 122.471 kg     There is no height or weight on file to calculate BMI.  General:  Well nourished, well developed, in no acute distress HEENT: normal Lymph: no adenopathy Neck: no JVD Endocrine:  No thryomegaly Vascular: No carotid bruits Cardiac:  RRR; no murmurs, gallops or rubs Lungs:  CTA b/l, no wheezing, rhonchi or rales  Abd: soft, nontender, obese  Ext: no edema Musculoskeletal:  No deformities Skin: warm and dry  Neuro:  Moving all extremities without clear focal motor abnormalities noted Psych:  Normal affect, appears somewhat anxious  EKG:  The EKG was personally reviewed and demonstrates:    SB 59bpm, Mobitz I (with noted PR prolongation), PVC, IVCD, QRS 178ms SB 57, Mbitz II, IVCD, PVC  05/30/19 read as AF though is regular, baseline artifact, PVC, (  likey is SR) 05/28/2019 SR 93bpm, 1st degree AVblock, PR 356ms  08/04/2018: regular rhythm, looks accel junctional, has some beats with prolonged PR, though not noted regularly, and some  suggestion of AV block   Telemetry:  Telemetry was personally reviewed with Dr. Curt Bears and demonstrates:   SR 1st degree AVblock, intermittent episodes of Mobitz II block noted, 40's-50's    Relevant CV Studies:  05/31/2019: TTE  IMPRESSIONS  1. Left ventricular ejection fraction, by estimation, is 55%. The left  ventricle has normal function. Difficult study for wall motion even with  Definity. There appears to be septal-lateral dyssynchrony consistent with  LBBB, but overall EF appears to be  in normal range. There is mild left ventricular hypertrophy. Left  ventricular diastolic parameters are consistent with Grade I diastolic  dysfunction (impaired relaxation).   2. Right ventricular systolic function is normal. The right ventricular  size is normal. Tricuspid regurgitation signal is inadequate for assessing  PA pressure.   3. The mitral valve is normal in structure. No evidence of mitral valve  regurgitation.   4. The aortic valve is tricuspid. Aortic valve regurgitation is not  visualized. Mild aortic valve sclerosis is present, with no evidence of  aortic valve stenosis.   5. IVC not visualized.    Laboratory Data:  High Sensitivity Troponin:   Recent Labs  Lab 08/21/19 0050  TROPONINIHS 16     Chemistry Recent Labs  Lab 08/21/19 0050  NA 138  K 4.0  CL 103  CO2 26  GLUCOSE 159*  BUN 30*  CREATININE 2.04*  CALCIUM 9.0  GFRNONAA 29*  GFRAA 34*  ANIONGAP 9    Recent Labs  Lab 08/21/19 0050  PROT 7.2  ALBUMIN 3.6  AST 18  ALT 14  ALKPHOS 49  BILITOT 0.4   Hematology Recent Labs  Lab 08/21/19 0050  WBC 4.8  RBC 3.11*  HGB 8.9*  HCT 25.6*  MCV 82.3  MCH 28.6  MCHC 34.8  RDW 14.8  PLT 242   BNP Recent Labs  Lab 08/21/19 0050  BNP 50.5    DDimer No results for input(s): DDIMER in the last 168 hours.   Radiology/Studies:   DG Chest Portable 1 View Result Date: 08/21/2019 CLINICAL DATA:  Recent syncopal episode EXAM: PORTABLE  CHEST 1 VIEW COMPARISON:  05/30/2019 FINDINGS: Cardiac shadow is stable. Aortic calcifications are again seen. Lungs are well aerated bilaterally. Elevation of the right hemidiaphragm is again seen. No focal infiltrate or sizable effusion is noted. IMPRESSION: No active disease. Electronically Signed   By: Inez Catalina M.D.   On: 08/21/2019 01:16     Assessment and Plan:   1. Syncope     Strikes as orthostatic initially, though reports remains weak for hours, discussed above 2. Known baseline conduction system disease     Mobitz II noted here is likely just a progression of this     No nodal blocking meds     TSH ordered    He Kedra Mcglade need pacing, though not certain his intermittent bradycardia explains his symptoms entirely Orthostatics are pending, supine BPs have been normotensive and hypertensive TSH pending Vastie Douty reach out to IM regarding any further evaluation they may want to pursue for his complaints as well as his anemia  (had Hgb 7.03 August 2018 associated with SBO admission, he denies any belly pain, reports new melena)     For questions or updates, please contact [facility] Please consult [email or web address] for contact info under  Signed, Baldwin Jamaica, PA-C  08/21/2019 10:01 AM  I have seen and examined this patient with Tommye Standard.  Agree with above, note added to reflect my findings.  On exam, RRR, no murmurs.  Presented with syncope x2 over the last 3 days. Syncope sounds orthostatic, but patient also found to have mobitz II AV block. Amerie Beaumont need pacemaker implant. It is possible that he also had bradycardia complicating during his syncopal episodes.   Pleas Carneal M. Rylee Nuzum MD 08/21/2019 2:10 PM

## 2019-08-21 NOTE — H&P (Signed)
History and Physical    Daniel Odom KCM:[id number] DOB: 1935-11-30 DOA: 08/21/2019  PCP: [facility]   Patient coming from: Home   Chief Complaint: Lightheadedness   HPI: Daniel Odom is a 84 y.o. male with medical history significant for hypertension, type 2 diabetes mellitus, chronic kidney disease, suspected CVA in May 2021, presenting to the emergency department after multiple episodes of lightheadedness and diaphoresis with syncope or near syncope.  Patient reports that he became lightheaded, sweaty, and felt as though he was losing consciousness on 08/18/2019.  Reports that symptoms developed shortly after standing and he did not feel better for roughly 2 or 3 hours.  Since then, he has had additional episodes, also occurring shortly after standing up and resolving over the course of 2 to 3 hours.  He denies any associated chest discomfort.  He had a another episode last night that resulted in a fall onto a couch without any appreciable injury.  Patient reports that he feels completely paralyzed after these episodes unable to move any of his extremities or lift his head but able to open and close his eyes.  He reports feeling back to normal by time of admission.  He denies any recent fevers or chills.  ED Course: Upon arrival to the ED, patient is found to be afebrile, saturating well on room air, and with stable blood pressure.  EKG features sinus rhythm with PVC and second-degree AV nodal block Mobitz type II.  Chest x-rays negative for acute cardiopulmonary disease.  Chemistry panel features a creatinine of 2.04, similar to priors.  CBC notable for a chronic normocytic anemia.  INR is elevated to 1.6.  Troponin is normal.  COVID-19 screening test not yet resulted.  Cardiology was consulted by the ED physician and recommends medical admission.  Review of Systems:  All other systems reviewed and apart from HPI, are negative.  Past Medical History:  Diagnosis Date  . Chronic  kidney disease (CKD), stage III (moderate)   . Colon cancer ([place])   . Diabetes mellitus without complication ([place])   . Hypertension   . Prostate cancer [facility])     Past Surgical History:  Procedure Laterality Date  . COLON SURGERY    . LAPAROSCOPIC LYSIS OF ADHESIONS N/A 08/08/2018   Procedure: LAPAROSCOPIC LYSIS OF ADHESIONS;  Surgeon: Kinsinger, Arta Bruce, MD;  Location: [place];  Service: General;  Laterality: N/A;  . LAPAROTOMY N/A 08/08/2018   Procedure: Exploratory Laparotomy;  Surgeon: Kieth Brightly Arta Bruce, MD;  Location: [place];  Service: General;  Laterality: N/A;  . LYSIS OF ADHESION N/A 08/08/2018   Procedure: Lysis of adhesions with repair of small intestine;  Surgeon: Kinsinger, Arta Bruce, MD;  Location: [place];  Service: General;  Laterality: N/A;    Social History:   reports that he has never smoked. He has never used smokeless tobacco. No history on file for alcohol use and drug use.  Allergies  Allergen Reactions  . Metformin And Related Hives  . Penicillins Hives    Did it involve swelling of the face/tongue/throat, SOB, or low BP? Yes Did it involve sudden or severe rash/hives, skin peeling, or any reaction on the inside of your mouth or nose? No Did you need to seek medical attention at a hospital or doctor's office? Yes When did it last happen?1968 If all above answers are "NO", may proceed with cephalosporin use.     Family History  Problem Relation Age of Onset  . Diabetes Mellitus II Brother   .  Colon cancer Neg Hx      Prior to Admission medications   Medication Sig Start Date End Date Taking? Authorizing Provider  albuterol (PROAIR HFA) 108 (90 Base) MCG/ACT inhaler Inhale 2 puffs into the lungs every 6 (six) hours as needed for wheezing or shortness of breath.    [provider]  atorvastatin (LIPITOR) 80 MG tablet Take 40 mg by mouth at bedtime.     [provider]  Calcium Carbonate-Vitamin D3 (CALCIUM 600+D3) 600-400 MG-UNIT TABS  Take 1 tablet by mouth in the morning and at bedtime.    [provider]  Carboxymethylcellulose Sod PF (REFRESH PLUS) 0.5 % SOLN Place 1 drop into both eyes in the morning, at noon, and at bedtime.     [provider]  Cholecalciferol (VITAMIN D-3) 25 MCG (1000 UT) CAPS Take 1,000 Units by mouth daily.    [provider]  clopidogrel (PLAVIX) 75 MG tablet Take 1 tablet (75 mg total) by mouth daily. 06/02/19   [name], MD  cyclobenzaprine (FLEXERIL) 10 MG tablet Take 10 mg by mouth 3 (three) times daily as needed for muscle spasms.    [provider]  docusate sodium (COLACE) 100 MG capsule Take 100 mg by mouth in the morning and at bedtime.    [provider]  enzalutamide Gillermina Phy) 40 MG capsule Take 80 mg by mouth daily at 6 PM.     [provider]  ferrous sulfate 325 (65 FE) MG tablet Take 325 mg by mouth every Monday, Wednesday, and Friday.    [provider]  fluticasone (FLONASE) 50 MCG/ACT nasal spray Place 1 spray into both nostrils daily as needed for allergies or rhinitis.    [provider]  glucose 4 GM chewable tablet Chew 1 tablet by mouth as needed for low blood sugar.    [provider]  insulin NPH-regular Human (NOVOLIN 70/30) (70-30) 100 UNIT/ML injection Inject 10-70 Units into the skin See admin instructions. Inject 70 units into the skin in the morning and 10-25 units in the evening    [provider]  lisinopril-hydrochlorothiazide (ZESTORETIC) 20-12.5 MG tablet Take 1 tablet by mouth daily.    [provider]  mometasone [facility]) 220 MCG/INH inhaler Inhale 2 puffs into the lungs at bedtime.    [provider]  polyethylene glycol (MIRALAX / GLYCOLAX) 17 g packet Take 17 g by mouth daily as needed for mild constipation or moderate constipation.  02/16/17   [provider]  PRESCRIPTION MEDICATION CPAP- At bedtime    [provider]  primidone  (MYSOLINE) 50 MG tablet Take 100-150 mg by mouth See admin instructions. Take 100 mg by mouth in the morning and 150 mg at bedtime    [provider]    Physical Exam: Vitals:   08/21/19 0024 08/21/19 0029 08/21/19 0130  BP:  (!) 176/84 (!) 151/67  Pulse:  54 62  Resp:  18 20  Temp:  98.1 F (36.7 C)   TempSrc:  Oral   SpO2: 100% 100% 99%    Constitutional: NAD, calm  Eyes: PERTLA, lids and conjunctivae normal ENMT: Mucous membranes are moist. Posterior pharynx clear of any exudate or lesions.   Neck: normal, supple, no masses, no thyromegaly Respiratory: no wheezing, no crackles. No accessory muscle use.  Cardiovascular: S1 & S2 heard, regular rate and rhythm. Pretibial pitting edema b/l.   Abdomen: No distension, no tenderness, soft. Bowel sounds active.  Musculoskeletal: no clubbing / cyanosis.  No joint deformity upper and lower extremities.   Skin: no significant rashes, lesions, ulcers. Warm, dry, well-perfused. Neurologic: CN 2-12 grossly intact. Sensation intact. Moving all extremities.  Psychiatric: Alert and oriented to person, place, and situation. Pleasant and cooperative.    Labs and Imaging on Admission: I have personally reviewed following labs and imaging studies  CBC: Recent Labs  Lab 08/21/19 0050  WBC 4.8  NEUTROABS 2.4  HGB 8.9*  HCT 25.6*  MCV 82.3  PLT 330   Basic Metabolic Panel: Recent Labs  Lab 08/21/19 0050  NA 138  K 4.0  CL 103  CO2 26  GLUCOSE 159*  BUN 30*  CREATININE 2.04*  CALCIUM 9.0  MG 2.0   GFR: CrCl cannot be calculated (Unknown ideal weight.). Liver Function Tests: Recent Labs  Lab 08/21/19 0050  AST 18  ALT 14  ALKPHOS 49  BILITOT 0.4  PROT 7.2  ALBUMIN 3.6   No results for input(s): LIPASE, AMYLASE in the last 168 hours. No results for input(s): AMMONIA in the last 168 hours. Coagulation Profile: Recent Labs  Lab 08/21/19 0050  INR 1.6*   Cardiac Enzymes: No results for input(s): CKTOTAL,  CKMB, CKMBINDEX, TROPONINI in the last 168 hours. BNP (last 3 results) No results for input(s): PROBNP in the last 8760 hours. HbA1C: No results for input(s): HGBA1C in the last 72 hours. CBG: No results for input(s): GLUCAP in the last 168 hours. Lipid Profile: No results for input(s): CHOL, HDL, LDLCALC, TRIG, CHOLHDL, LDLDIRECT in the last 72 hours. Thyroid Function Tests: No results for input(s): TSH, T4TOTAL, FREET4, T3FREE, THYROIDAB in the last 72 hours. Anemia Panel: No results for input(s): VITAMINB12, FOLATE, FERRITIN, TIBC, IRON, RETICCTPCT in the last 72 hours. Urine analysis:    Component Value Date/Time   COLORURINE YELLOW 08/03/2018 2313   APPEARANCEUR CLEAR 08/03/2018 2313   LABSPEC 1.019 08/03/2018 2313   PHURINE 6.0 08/03/2018 2313   GLUCOSEU NEGATIVE 08/03/2018 2313   HGBUR NEGATIVE 08/03/2018 2313   BILIRUBINUR NEGATIVE 08/03/2018 2313   KETONESUR NEGATIVE 08/03/2018 2313   PROTEINUR 30 (A) 08/03/2018 2313   NITRITE NEGATIVE 08/03/2018 2313   LEUKOCYTESUR NEGATIVE 08/03/2018 2313   Sepsis Labs: @LABRCNTIP (procalcitonin:4,lacticidven:4) )No results found for this or any previous visit (from the past 240 hour(s)).   Radiological Exams on Admission: DG Chest Portable 1 View  Result Date: 08/21/2019 CLINICAL DATA:  Recent syncopal episode EXAM: PORTABLE CHEST 1 VIEW COMPARISON:  05/30/2019 FINDINGS: Cardiac shadow is stable. Aortic calcifications are again seen. Lungs are well aerated bilaterally. Elevation of the right hemidiaphragm is again seen. No focal infiltrate or sizable effusion is noted. IMPRESSION: No active disease. Electronically Signed   By: Inez Catalina M.D.   On: 08/21/2019 01:16    EKG: Independently reviewed. Sinus rhythm, PVC, 2nd degree AV block Mobitz type II.   Assessment/Plan   1. Syncope; Mobitz type II 2nd degree AV block  - Presents after multiple episodes of lightheadedness and diaphoresis with syncope or near-syncope, notes that  [place] physicians had been discussing loop recorder and/or pacemaker with him, and is found to have 2nd degree AV block Mobitz type II  - Cardiology is consulting and much appreciated, will follow-up on recommendations   2. History of CVA  - Admitted with suspected CVA in May 2021, completed 3 weeks of DAPT and was recommended by neurology to continue Plavix and statin  - Pharmacy medication-reconciliation pending   3. CKD 3b  - SCr is 2.04 on admission, similar  to priors  - Renally-dose medications, monitor    4. Insulin-dependent DM  - A1c was 7.3% in May 2021  - Continue CBG checks and insulin     DVT prophylaxis: sq heparin  Code Status: Full  Family Communication: Wife updated in ED  Disposition Plan:  Patient is from: Home  Anticipated d/c is to: TBD Anticipated d/c date is: 08/22/19 Patient currently: Pending cardiology consultation  Consults called: Cardiology consulted by ED physician  Admission status: Observation      Vianne Bulls, MD [facility]  08/21/2019, 2:08 AM

## 2019-08-21 NOTE — H&P (Signed)
Daniel Odom has presented today for surgery, with the diagnosis of mobitz II AV block.  The various methods of treatment have been discussed with the patient and family. After consideration of risks, benefits and other options for treatment, the patient has consented to  Procedure(s): Pacemaker implant as a surgical intervention .  Risks include but not limited to bleeding, tamponade, infection, pneumothorax, among others. The patient's history has been reviewed, patient examined, no change in status, stable for surgery.  I have reviewed the patient's chart and labs.  Questions were answered to the patient's satisfaction.    Shaelynn Dragos Curt Bears, MD 08/21/2019 2:12 PM

## 2019-08-21 NOTE — Progress Notes (Signed)
Admission after midnight. Briefly, 84 year old male with history of CKD-4, AVB, prostate cancer, HTN, colon cancer s/p resection, CVA and possible recent diagnosis of A. fib on warfarin presenting with syncope likely due to Mobitz type II HB.  Evaluated by cardiology.  Echocardiogram pending.  Plan for possible PPM.  Subjective This morning, feels better.  Reports "a little lightheadedness".  Denies chest pain, palpitation, shortness of breath, GI or UTI symptoms.  Objective Vitals:   08/21/19 0315 08/21/19 0330 08/21/19 0345 08/21/19 0447  BP: (!) 153/70 (!) 171/70 (!) 144/61 (!) 154/51  Pulse: 59 57 54 (!) 41  Resp: 13 16 13 14   Temp:      TempSrc:      SpO2: 98% 100% 98% 100%   GENERAL: No apparent distress.  Nontoxic. HEENT: MMM.  Vision and hearing grossly intact.  NECK: Supple.  No apparent JVD.  RESP:  No IWOB.  Fair aeration bilaterally. CVS: HR 38-55. Heart sounds normal.  ABD/GI/GU: BS+. Abd soft, NTND.  MSK/EXT:  Moves extremities. No apparent deformity.  1+ pitting edema in BLE SKIN: Old abdominal surgical scar NEURO: Awake, alert and oriented appropriately.  No apparent focal neuro deficit. PSYCH: Calm. Normal affect.  Assessment and plan Syncope likely due to Mobitz type II AVB -Plan for PPM per cardiology -Follow echocardiogram  History of CVA/TIA: Hospitalized in May 2021 and discharged on DAPT for 3 weeks followed by Plavix.  CT and MRI was negative at that time.  Per patient's wife, he was taken off Plavix and started on warfarin about 10 days ago for A. Fib  Chronic diastolic CHF: Last echo in 5/21 with EF of 55%, G1-DD.  Has 1+ pitting edema in BLE.  No respiratory distress.  CXR without acute finding. -Monitor fluid status   Recent diagnosis of A. Fib?  Per patient's wife, he was started on warfarin about 10 days ago. -Continue holding warfarin for now.  CKD-4: Cr 2.04 (about baseline). -Continue monitoring  Uncontrolled IDDM-2 with hyperglycemia and  CKD-4: A1c 7.3 in 05/2019. Recent Labs  Lab 08/21/19 0337  GLUCAP 148*  -Continue SSI  Essential hypertension: Normotensive -Continue holding lisinopril and HCTZ -May benefit from Lasix and instead of HCTZ going forward.  History of colon cancer status post resection- History of prostate cancer-followed by urology at [facility]

## 2019-08-21 NOTE — Progress Notes (Signed)
Patient refused CPAP HS tonight. Patient stated hes going home tomorrow and does not need CPAP tonight.

## 2019-08-21 NOTE — ED Provider Notes (Signed)
[facility] EMERGENCY DEPARTMENT Provider Note   CSN: [id number] Arrival date & time: 08/21/19  0011     History No chief complaint on file.   Daniel Odom is a 84 y.o. male.  HPI     This is an 84 year old male with a history of chronic kidney disease, colon cancer status post small bowel obstruction and resection, diabetes, hypertension, stroke on Coumadin who presents with syncope.  Patient reports that he got up from his chair earlier this evening.  He states that he walked 2 steps and "I was on the ground."  He reports he is unsure whether he fully lost consciousness.  He was unable to get up on his own and called for help from his wife.  He states he had a similar episode on Saturday.  He has had intermittent episodes of lightheadedness.  Denies room spinning dizziness.  Denies weakness, numbness, strokelike symptoms.  He has not had any chest pain or significant palpitations.  He does report that he follows with cardiology at the [facility] and "they talked about putting a pacemaker in."  Denies any recent illnesses or fevers.  He has had both of his Covid vaccinations.  Patient denies any recent medication changes or AV nodal blocking agents.  Wife is now at the bedside.  Confirms that he has had several recent episodes of lightheadedness and diaphoresis.  [facility]  She is unclear of the indication and is also unsure of why they were talking about pacemaker placement.  Past Medical History:  Diagnosis Date  . Chronic kidney disease (CKD), stage III (moderate)   . Colon cancer ([place])   . Diabetes mellitus without complication ([place])   . Hypertension   . Prostate cancer [facility])     Patient Active Problem List   Diagnosis Date Noted  . TIA (transient ischemic attack) 06/01/2019  . Left-sided weakness 05/30/2019  . Mobitz type 1 second degree atrioventricular block 08/05/2018  . Morbid obesity with body mass index of 40.0-49.9  ([place]) 08/05/2018  . Hypertension   . Diabetes mellitus without complication ([place])   . SBO (small bowel obstruction) ([place]) 08/03/2018  . History of colon cancer 08/03/2018  . Diabetes mellitus type 2 in obese ([place]) 08/03/2018  . Essential hypertension 08/03/2018  . Anemia associated with chronic renal failure 08/03/2018  . CKD (chronic kidney disease) stage 3, GFR 30-59 ml/min 02/07/2017  . OSA on CPAP 02/07/2017  . Tremor, hereditary, benign 02/07/2017  . Asthma 02/07/2017  . 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 Prostate cancer ([place]) 08/08/2016    Past Surgical History:  Procedure Laterality Date  . COLON SURGERY    . LAPAROSCOPIC LYSIS OF ADHESIONS N/A 08/08/2018   Procedure: LAPAROSCOPIC LYSIS OF ADHESIONS;  Surgeon: Kinsinger, Arta Bruce, MD;  Location: [place];  Service: General;  Laterality: N/A;  . LAPAROTOMY N/A 08/08/2018   Procedure: Exploratory Laparotomy;  Surgeon: Kieth Brightly Arta Bruce, MD;  Location: [place];  Service: General;  Laterality: N/A;  . LYSIS OF ADHESION N/A 08/08/2018   Procedure: Lysis of adhesions with repair of small intestine;  Surgeon: Kieth Brightly Arta Bruce, MD;  Location: [place];  Service: General;  Laterality: N/A;       Family History  Problem Relation Age of Onset  . Diabetes Mellitus II Brother   . Colon cancer Neg Hx     Social History   Tobacco Use  . Smoking status: Never Smoker  .  Smokeless tobacco: Never Used  Substance Use Topics  . Alcohol use: Not on file  . Drug use: Not on file    Home Medications Prior to Admission medications   Medication Sig Start Date End Date Taking? Authorizing Provider  albuterol (PROAIR HFA) 108 (90 Base) MCG/ACT inhaler Inhale 2 puffs into the lungs every 6 (six) hours as needed for wheezing or shortness of breath.    [provider]  atorvastatin (LIPITOR) 80 MG tablet Take 40 mg by mouth at bedtime.     [provider]  Calcium Carbonate-Vitamin D3 (CALCIUM  600+D3) 600-400 MG-UNIT TABS Take 1 tablet by mouth in the morning and at bedtime.    [provider]  Carboxymethylcellulose Sod PF (REFRESH PLUS) 0.5 % SOLN Place 1 drop into both eyes in the morning, at noon, and at bedtime.     [provider]  Cholecalciferol (VITAMIN D-3) 25 MCG (1000 UT) CAPS Take 1,000 Units by mouth daily.    [provider]  clopidogrel (PLAVIX) 75 MG tablet Take 1 tablet (75 mg total) by mouth daily. 06/02/19   [name], MD  cyclobenzaprine (FLEXERIL) 10 MG tablet Take 10 mg by mouth 3 (three) times daily as needed for muscle spasms.    [provider]  docusate sodium (COLACE) 100 MG capsule Take 100 mg by mouth in the morning and at bedtime.    [provider]  enzalutamide Gillermina Phy) 40 MG capsule Take 80 mg by mouth daily at 6 PM.     [provider]  ferrous sulfate 325 (65 FE) MG tablet Take 325 mg by mouth every Monday, Wednesday, and Friday.    [provider]  fluticasone (FLONASE) 50 MCG/ACT nasal spray Place 1 spray into both nostrils daily as needed for allergies or rhinitis.    [provider]  glucose 4 GM chewable tablet Chew 1 tablet by mouth as needed for low blood sugar.    [provider]  insulin NPH-regular Human (NOVOLIN 70/30) (70-30) 100 UNIT/ML injection Inject 10-70 Units into the skin See admin instructions. Inject 70 units into the skin in the morning and 10-25 units in the evening    [provider]  lisinopril-hydrochlorothiazide (ZESTORETIC) 20-12.5 MG tablet Take 1 tablet by mouth daily.    [provider]  mometasone [facility]) 220 MCG/INH inhaler Inhale 2 puffs into the lungs at bedtime.    [provider]  polyethylene glycol (MIRALAX / GLYCOLAX) 17 g packet Take 17 g by mouth daily as needed for mild constipation or moderate constipation.  02/16/17   [provider]  PRESCRIPTION MEDICATION CPAP- At bedtime     [provider]  primidone (MYSOLINE) 50 MG tablet Take 100-150 mg by mouth See admin instructions. Take 100 mg by mouth in the morning and 150 mg at bedtime    [provider]    Allergies    Metformin and related and Penicillins  Review of Systems   Review of Systems  Constitutional: Negative for fever.  Respiratory: Negative for shortness of breath.   Cardiovascular: Negative for chest pain.  Gastrointestinal: Negative for abdominal pain, nausea and vomiting.  Genitourinary: Negative for difficulty urinating.  Neurological: Positive for dizziness, syncope and light-headedness. Negative for weakness and numbness.  All other systems reviewed and are negative.   Physical Exam Updated Vital Signs BP (!) 151/67   Pulse 62   Temp 98.1 F (36.7 C) (Oral)   Resp 20   SpO2 99%  Physical Exam Vitals and nursing note reviewed.  Constitutional:      Appearance: He is well-developed. He is obese. He is not ill-appearing.  HENT:     Head: Normocephalic and atraumatic.     Nose: Nose normal.     Mouth/Throat:     Mouth: Mucous membranes are moist.  Eyes:     Pupils: Pupils are equal, round, and reactive to light.  Cardiovascular:     Rate and Rhythm: Bradycardia present. Rhythm irregular.     Heart sounds: Normal heart sounds. No murmur heard.   Pulmonary:     Effort: Pulmonary effort is normal. No respiratory distress.     Breath sounds: Normal breath sounds. No wheezing.  Abdominal:     General: Bowel sounds are normal.     Palpations: Abdomen is soft.     Tenderness: There is no abdominal tenderness. There is no rebound.     Comments: Extensive abdominal scarring, abdomen otherwise soft with normal bowel sounds  Musculoskeletal:     Cervical back: Neck supple.     Right lower leg: Edema present.     Left lower leg: Edema present.  Lymphadenopathy:     Cervical: No cervical adenopathy.  Skin:    General: Skin is warm and dry.  Neurological:      Mental Status: He is alert and oriented to person, place, and time.  Psychiatric:        Mood and Affect: Mood normal.     ED Results / Procedures / Treatments   Labs (all labs ordered are listed, but only abnormal results are displayed) Labs Reviewed  CBC WITH DIFFERENTIAL/PLATELET - Abnormal; Notable for the following components:      Result Value   RBC 3.11 (*)    Hemoglobin 8.9 (*)    HCT 25.6 (*)    All other components within normal limits  COMPREHENSIVE METABOLIC PANEL - Abnormal; Notable for the following components:   Glucose, Bld 159 (*)    BUN 30 (*)    Creatinine, Ser 2.04 (*)    GFR calc non Af Amer 29 (*)    GFR calc Af Amer 34 (*)    All other components within normal limits  PROTIME-INR - Abnormal; Notable for the following components:   Prothrombin Time 18.8 (*)    INR 1.6 (*)    All other components within normal limits  SARS CORONAVIRUS 2 BY RT PCR (HOSPITAL ORDER, [facility])  MAGNESIUM  BRAIN NATRIURETIC PEPTIDE  TROPONIN I (HIGH SENSITIVITY)    EKG EKG Interpretation  Date/Time:  Tuesday August 21 2019 00:25:46 EDT Ventricular Rate:  59 PR Interval:    QRS Duration: 126 QT Interval:  481 QTC Calculation: 477 R Axis:   130 Text Interpretation: Sinus rhythm Ventricular premature complex Mobitz II 2-degree AV block Prolonged PR interval Nonspecific intraventricular conduction delay Probable anterolateral infarct, age indeterm Abnormal T, consider ischemia, lateral leads Reconfirmed by [facility] Jew [phone]) on 08/21/2019 1:27:44 AM   Radiology DG Chest Portable 1 View  Result Date: 08/21/2019 CLINICAL DATA:  Recent syncopal episode EXAM: PORTABLE CHEST 1 VIEW COMPARISON:  05/30/2019 FINDINGS: Cardiac shadow is stable. Aortic calcifications are again seen. Lungs are well aerated bilaterally. Elevation of the right hemidiaphragm is again seen. No focal infiltrate or sizable effusion is noted. IMPRESSION: No active disease.  Electronically Signed   By: Inez Catalina M.D.   On: 08/21/2019 01:16    Procedures Procedures (including critical care time)  CRITICAL CARE Performed by: Merryl Hacker   Total critical care time: 40 minutes  Critical care time was exclusive of separately billable procedures and treating other patients.  Critical care was necessary to treat or prevent imminent or life-threatening deterioration.  Critical care was time spent personally by me on the following activities: development of treatment plan with patient and/or surrogate as well as nursing, discussions with consultants, evaluation of patient's response to treatment, examination of patient, obtaining history from patient or surrogate, ordering and performing treatments and interventions, ordering and review of laboratory studies, ordering and review of radiographic studies, pulse oximetry and re-evaluation of patient's condition.   Medications Ordered in ED Medications - No data to display  ED Course  I have reviewed the triage vital signs and the nursing notes.  Pertinent labs & imaging results that were available during my care of the patient were reviewed by me and considered in my medical decision making (see chart for details).  Clinical Course as of Aug 21 143  Tue Aug 21, 2019  0143 Spoke with cardiology.  They will assess patient.  Patient made n.p.o. for possible pacemaker placement.  Given recent ischemic stroke and syncope, would like hospitalist to admit for primary.   [CH]    Clinical Course User Index [CH] Yuriy Cui, Barbette Hair, MD   MDM Rules/Calculators/A&P                           Patient presents with progressive lightheadedness and a syncopal episode this evening.  Reports similar episode on Saturday and progressively worsening lightheadedness at increasing intervals.  He is chronically ill-appearing but nontoxic.  Vital signs initially notable for some bradycardia.  EKG is concerning for Mobitz type  II AV block.  This could be the etiology of the patient's symptoms.  Doubt stroke.  Primary ACS also consideration although he is without chest pain or shortness of breath.  He is otherwise nonfocal on exam although he did have a recent stroke and was placed on Coumadin.  INR is 1.6.  CBC is reassuring.  CMP with stable metabolites.  EKG shows no ischemic changes and chest x-ray is without pneumothorax or pneumonia.  This was independently reviewed by myself.  See discussion with cardiology above.  Patient was placed on pacer pads in case he further decompensates.  He was made n.p.o. after midnight.  Plan for admission to the hospitalist with cardiology consultation for pacemaker placement.  Final Clinical Impression(s) / ED Diagnoses Final diagnoses:  Mobitz type 2 second degree AV block  Syncope, unspecified syncope type    Rx / DC Orders ED Discharge Orders    None       Blanchie Zeleznik, Barbette Hair, MD 08/21/19 [phone]

## 2019-08-21 NOTE — ED Triage Notes (Signed)
Pt comes via [facility] from home for generalized weakness, unable to get up off the couch, dizziness, headache, similar episode on Sat with nausea

## 2019-08-21 NOTE — Progress Notes (Signed)
Received pt post pacemaker placement,alert and oriented. Left chest dressing CDI.

## 2019-08-22 ENCOUNTER — Encounter (HOSPITAL_COMMUNITY): Payer: Self-pay | Admitting: Cardiology

## 2019-08-22 ENCOUNTER — Observation Stay (HOSPITAL_COMMUNITY): Payer: No Typology Code available for payment source

## 2019-08-22 DIAGNOSIS — I129 Hypertensive chronic kidney disease with stage 1 through stage 4 chronic kidney disease, or unspecified chronic kidney disease: Secondary | ICD-10-CM | POA: Diagnosis not present

## 2019-08-22 DIAGNOSIS — I441 Atrioventricular block, second degree: Secondary | ICD-10-CM

## 2019-08-22 DIAGNOSIS — R55 Syncope and collapse: Secondary | ICD-10-CM | POA: Diagnosis not present

## 2019-08-22 DIAGNOSIS — N1832 Chronic kidney disease, stage 3b: Secondary | ICD-10-CM | POA: Diagnosis not present

## 2019-08-22 DIAGNOSIS — E1169 Type 2 diabetes mellitus with other specified complication: Secondary | ICD-10-CM | POA: Diagnosis not present

## 2019-08-22 DIAGNOSIS — E1122 Type 2 diabetes mellitus with diabetic chronic kidney disease: Secondary | ICD-10-CM | POA: Diagnosis not present

## 2019-08-22 DIAGNOSIS — Z20822 Contact with and (suspected) exposure to covid-19: Secondary | ICD-10-CM | POA: Diagnosis not present

## 2019-08-22 LAB — CBC
HCT: 27.5 % — ABNORMAL LOW (ref 39.0–52.0)
Hemoglobin: 9.4 g/dL — ABNORMAL LOW (ref 13.0–17.0)
MCH: 28 pg (ref 26.0–34.0)
MCHC: 34.2 g/dL (ref 30.0–36.0)
MCV: 81.8 fL (ref 80.0–100.0)
Platelets: 225 10*3/uL (ref 150–400)
RBC: 3.36 MIL/uL — ABNORMAL LOW (ref 4.22–5.81)
RDW: 14.5 % (ref 11.5–15.5)
WBC: 5.5 10*3/uL (ref 4.0–10.5)
nRBC: 0 % (ref 0.0–0.2)

## 2019-08-22 LAB — BASIC METABOLIC PANEL
Anion gap: 9 (ref 5–15)
BUN: 25 mg/dL — ABNORMAL HIGH (ref 8–23)
CO2: 25 mmol/L (ref 22–32)
Calcium: 9 mg/dL (ref 8.9–10.3)
Chloride: 104 mmol/L (ref 98–111)
Creatinine, Ser: 1.74 mg/dL — ABNORMAL HIGH (ref 0.61–1.24)
GFR calc Af Amer: 41 mL/min — ABNORMAL LOW (ref >60)
GFR calc non Af Amer: 36 mL/min — ABNORMAL LOW (ref >60)
Glucose, Bld: 149 mg/dL — ABNORMAL HIGH (ref 70–99)
Potassium: 4 mmol/L (ref 3.5–5.1)
Sodium: 138 mmol/L (ref 135–145)

## 2019-08-22 LAB — GLUCOSE, CAPILLARY
Glucose-Capillary: 134 mg/dL — ABNORMAL HIGH (ref 70–99)
Glucose-Capillary: 138 mg/dL — ABNORMAL HIGH (ref 70–99)
Glucose-Capillary: 143 mg/dL — ABNORMAL HIGH (ref 70–99)
Glucose-Capillary: 206 mg/dL — ABNORMAL HIGH (ref 70–99)

## 2019-08-22 LAB — MAGNESIUM: Magnesium: 1.7 mg/dL (ref 1.7–2.4)

## 2019-08-22 MED ORDER — POLYETHYLENE GLYCOL 3350 17 G PO PACK: 17.0000 g | PACK | Freq: Every day | ORAL | Status: DC | PRN

## 2019-08-22 MED ORDER — ATORVASTATIN CALCIUM 40 MG PO TABS: 40.0000 mg | ORAL_TABLET | Freq: Every day | ORAL | Status: DC

## 2019-08-22 NOTE — Progress Notes (Signed)
[facility] PROGRESS NOTE    Progress Note  Daniel Odom  TDD:[id number] DOB: [birth year] DOA: 08/21/2019 PCP: [facility]     Brief Narrative:   Daniel Odom is an 84 y.o. male past medical history of chronic kidney disease stage IV, AVB, prostate cancer, diabetes mellitus type 2 colon cancer status post resection CVA and possibly recently diagnosed with atrial fibrillation on Coumadin comes in with syncope likely due to Mobitz type II heart block.  EP was consulted for possible PPM  Assessment/Plan:  Syncope likely due to Mobitz type II heart block: EP was consulted that recommended PPM placed on 08/21/2019. Will discuss with cardiology to restart Coumadin.  History of CVA: Admitted on 06/14/2019 and was treated of 3 weeks of DAPT, now only on Plavix and statins.  Chronic kidney disease stage IV: With a baseline creatinine around 1.7 continue to monitor closely.  Diabetes mellitus type 2: With an A1c of 7.3 on 06/14/2019. Continue long-acting insulin plus sliding scale.  Essential hypertension: Blood pressure seems to be fairly controlled continue current regimen. Holding lisinopril and hydrochlorothiazide, he did receive gentamicin and vancomycin prior to procedure.  DVT prophylaxis: heparin Family Communication:wife Status is: Observation  The patient remains OBS appropriate and will d/c before 2 midnights.  Dispo: The patient is from: Home              Anticipated d/c is to: Home              Anticipated d/c date is: 1 day              Patient currently is medically stable to d/c.        Code Status:     Code Status Orders  (From admission, onward)         Start     Ordered   08/21/19 0256  Full code  Continuous        08/21/19 0255        Code Status History    Date Active Date Inactive Code Status Order ID Comments User Context   05/30/2019 1700 06/01/2019 1623 Full Code [id number]  Lequita Halt, MD ED   08/03/2018 2309 08/17/2018 2014  Full Code [id number]  Rise Patience, MD Inpatient   Advance Care Planning Activity        IV Access:    Peripheral IV   Procedures and diagnostic studies:   DG Chest 2 View  Result Date: 08/22/2019 CLINICAL DATA:  Cardiac device EXAM: CHEST - 2 VIEW COMPARISON:  Yesterday FINDINGS: Interval dual-chamber pacer implant from the left with leads over the right atrial appendage and right ventricle. No pneumothorax or interval mediastinal widening. Stable elevation of the right diaphragm. No failure or effusion. IMPRESSION: Dual-chamber pacer implant without complicating feature. Electronically Signed   By: Monte Fantasia M.D.   On: 08/22/2019 06:25   EP PPM/ICD IMPLANT  Result Date: 08/21/2019 SURGEON:  Allegra Lai, MD   PREPROCEDURE DIAGNOSIS:  Mobitz II AV block, syncope   POSTPROCEDURE DIAGNOSIS:  Mobitz II AV block, syncope    PROCEDURES:  1. Pacemaker implantation.  2. Left upper extremity venography.   INTRODUCTION:  Daniel Odom is a 84 y.o. male with a history of bradycardia who presents today for pacemaker implantation.  The patient reports intermittent episodes of dizziness over the past few months.  No reversible causes have been identified.  The patient therefore presents today for pacemaker implantation.   DESCRIPTION OF PROCEDURE:  Informed written consent  was obtained, and  the patient was brought to the electrophysiology lab in a fasting state.  The patient required no sedation for the procedure today.  The patients left chest was prepped and draped in the usual sterile fashion by the EP lab staff. The skin overlying the left deltopectoral region was infiltrated with lidocaine for local analgesia.  A 4-cm incision was made over the left deltopectoral region.  A left subcutaneous pacemaker pocket was fashioned using a combination of sharp and blunt dissection. Electrocautery was required to assure hemostasis.  Left Upper Extremity Venography: A venogram of the left upper  extremity was performed, which revealed a large left cephalic vein, which emptied into a large left subclavian vein.  The left axillary vein was moderate in size.  RA/RV Lead Placement: The left axillary vein was therefore cannulated.  Through the left axillary vein, a [employer] model Tendril MRI [id number] (serial number  [id number]) right atrial lead and a [employer] model Tendril MRI [id number] (serial number  [id number]) right ventricular lead were advanced with fluoroscopic visualization into the right atrial appendage and right ventricular apex positions respectively.  Initial atrial lead P- waves measured 3.3 mV with impedance of 511 ohms and a threshold of 0.6 V at 0.5 msec.  Right ventricular lead R-waves measured 11.9 mV with an impedance of 458 ohms and a threshold of 0.5 V at 0.5 msec.  Both leads were secured to the pectoralis fascia using #2-0 silk over the suture sleeves. Device Placement:  The leads were then connected to a [place] MRI  model [id number] (serial number  [id number] ) pacemaker.  The pocket was irrigated with copious gentamicin solution.  The pacemaker was then placed into the pocket.  The pocket was then closed in 3 layers with 2.0 Vicryl suture for the 3.0 Vicryl suture subcutaneous and subcuticular layers.  Steri-  Strips and a sterile dressing were then applied. EBL<28ml.  There were no early apparent complications.   CONCLUSIONS:  1. Successful implantation of a [employer] Assurity MRI dual-chamber pacemaker for symptomatic bradycardia  2. No early apparent complications.       Will Curt Bears, MD 08/21/2019 3:39 PM   DG Chest Portable 1 View  Result Date: 08/21/2019 CLINICAL DATA:  Recent syncopal episode EXAM: PORTABLE CHEST 1 VIEW COMPARISON:  05/30/2019 FINDINGS: Cardiac shadow is stable. Aortic calcifications are again seen. Lungs are well aerated bilaterally. Elevation of the right hemidiaphragm is again seen. No focal infiltrate or sizable effusion is  noted. IMPRESSION: No active disease. Electronically Signed   By: Inez Catalina M.D.   On: 08/21/2019 01:16     Medical Consultants:    None.  Anti-Infectives:   Single dose of vancomycin and gentamicin  Subjective:    Daniel Odom feels great no new complaints.  Objective:    Vitals:   08/21/19 2115 08/21/19 2356 08/22/19 0000 08/22/19 0330  BP:  (!) 179/82  (!) 159/71  Pulse:  61  59  Resp: (!) 24 20  20   Temp:  98.8 F (37.1 C)  98.4 F (36.9 C)  TempSrc:  Oral  Oral  SpO2:  100%  100%  Weight:   (!) 120.6 kg   Height:       SpO2: 100 %   Intake/Output Summary (Last 24 hours) at 08/22/2019 0658 Last data filed at 08/22/2019 0331 Gross per 24 hour  Intake 433.75 ml  Output 875 ml  Net -441.25 ml   [employer]  08/21/19 1236 08/22/19 0000  Weight: (!) 122.5 kg (!) 120.6 kg    Exam: General exam: In no acute distress. Respiratory system: Good air movement and clear to auscultation. Cardiovascular system: S1 & S2 heard, RRR. No JVD.  Gastrointestinal system: Abdomen is nondistended, soft and nontender.  Extremities: No pedal edema. Skin: No rashes, lesions or ulcers Psychiatry: Judgement and insight appear normal. Mood & affect appropriate.    Data Reviewed:    Labs: Basic Metabolic Panel: Recent Labs  Lab 08/21/19 0050 08/21/19 0050 08/21/19 1309 08/22/19 0527  NA 138  --  138 138  K 4.0   < > 4.3 4.0  CL 103  --  105 104  CO2 26  --  29 25  GLUCOSE 159*  --  137* 149*  BUN 30*  --  26* 25*  CREATININE 2.04*  --  1.81* 1.74*  CALCIUM 9.0  --  9.0 9.0  MG 2.0  --   --  1.7   < > = values in this interval not displayed.   GFR Estimated Creatinine Clearance: 40.7 mL/min (A) (by C-G formula based on SCr of 1.74 mg/dL (H)). Liver Function Tests: Recent Labs  Lab 08/21/19 0050  AST 18  ALT 14  ALKPHOS 49  BILITOT 0.4  PROT 7.2  ALBUMIN 3.6   No results for input(s): LIPASE, AMYLASE in the last 168 hours. No results for input(s):  AMMONIA in the last 168 hours. Coagulation profile Recent Labs  Lab 08/21/19 0050  INR 1.6*   COVID-19 Labs  Recent Labs    08/21/19 1309  FERRITIN 172    Lab Results  Component Value Date   SARSCOV2NAA NEGATIVE 08/21/2019   SARSCOV2NAA NEGATIVE 05/30/2019   [place] NEGATIVE 08/03/2018    CBC: Recent Labs  Lab 08/21/19 0050 08/21/19 1309 08/22/19 0527  WBC 4.8 4.2 5.5  NEUTROABS 2.4  --   --   HGB 8.9* 9.2* 9.4*  HCT 25.6* 27.5* 27.5*  MCV 82.3 83.6 81.8  PLT 242 253 225   Cardiac Enzymes: No results for input(s): CKTOTAL, CKMB, CKMBINDEX, TROPONINI in the last 168 hours. BNP (last 3 results) No results for input(s): PROBNP in the last 8760 hours. CBG: Recent Labs  Lab 08/21/19 1341 08/21/19 2032 08/21/19 2128 08/21/19 2354 08/22/19 0348  GLUCAP 118* 197* 177* 134* 138*   D-Dimer: No results for input(s): DDIMER in the last 72 hours. Hgb A1c: No results for input(s): HGBA1C in the last 72 hours. Lipid Profile: No results for input(s): CHOL, HDL, LDLCALC, TRIG, CHOLHDL, LDLDIRECT in the last 72 hours. Thyroid function studies: Recent Labs    08/21/19 1309  TSH 1.214   Anemia work up: Recent Labs    08/21/19 1309 08/21/19 1310  VITAMINB12 245  --   FOLATE 11.9  --   FERRITIN 172  --   TIBC 230*  --   IRON 70  --   RETICCTPCT  --  1.1   Sepsis Labs: Recent Labs  Lab 08/21/19 0050 08/21/19 1309 08/22/19 0527  WBC 4.8 4.2 5.5   Microbiology Recent Results (from the past 240 hour(s))  SARS Coronavirus 2 by RT PCR (hospital order, performed in [facility] hospital lab) Nasopharyngeal Nasopharyngeal Swab     Status: None   Collection Time: 08/21/19  1:04 AM   Specimen: Nasopharyngeal Swab  Result Value Ref Range Status   SARS Coronavirus 2 NEGATIVE NEGATIVE Final    Comment: (NOTE) SARS-CoV-2 target nucleic acids are NOT DETECTED.  The  SARS-CoV-2 RNA is generally detectable in upper and lower respiratory specimens during the  acute phase of infection. The lowest concentration of SARS-CoV-2 viral copies this assay can detect is 250 copies / mL. A negative result does not preclude SARS-CoV-2 infection and should not be used as the sole basis for treatment or other patient management decisions.  A negative result may occur with improper specimen collection / handling, submission of specimen other than nasopharyngeal swab, presence of viral mutation(s) within the areas targeted by this assay, and inadequate number of viral copies (<250 copies / mL). A negative result must be combined with clinical observations, patient history, and epidemiological information.  Fact Sheet for Patients:   StrictlyIdeas.no  Fact Sheet for Healthcare Providers: BankingDealers.co.za  This test is not yet approved or  cleared by the [place] FDA and has been authorized for detection and/or diagnosis of SARS-CoV-2 by FDA under an Emergency Use Authorization (EUA).  This EUA will remain in effect (meaning this test can be used) for the duration of the COVID-19 declaration under Section 564(b)(1) of the Act, 21 U.S.C. section 360bbb-3(b)(1), unless the authorization is terminated or revoked sooner.  Performed at [facility], [place] [address] [place], [place] [address]      Medications:   . heparin  5,000 Units Subcutaneous Q8H  . insulin aspart  0-9 Units Subcutaneous Q4H  . sodium chloride flush  3 mL Intravenous Q12H  . sodium chloride flush  3 mL Intravenous Q12H  . sodium chloride flush  3 mL Intravenous Q12H   Continuous Infusions: . sodium chloride Stopped (08/22/19 0252)      LOS: 0 days   [name]  [facility]  08/22/2019, 6:58 AM

## 2019-08-22 NOTE — TOC Progression Note (Addendum)
Transition of Care [facility]) - Progression Note    Patient Details  Name: Daniel Odom MRN: [id number] Date of Birth: [birth year]  Transition of Care [facility]) CM/SW Contact  Zenon Mayo, RN Phone Number: 08/22/2019, 12:47 PM  Clinical Narrative:    NCM spoke with patient, he states he does not need HHPT, HHOT, he has no other needs.       Expected Discharge Plan and Services           Expected Discharge Date: 08/22/19                                     Social Determinants of Health (SDOH) Interventions    Readmission Risk Interventions No flowsheet data found.

## 2019-08-22 NOTE — Care Management Obs Status (Signed)
[place] NOTIFICATION   Patient Details  Name: Sebastien Jackson MRN: [id number] Date of Birth: [birth year]   Medicare Observation Status Notification Given:  Yes    Zenon Mayo, RN 08/22/2019, 9:26 AM

## 2019-08-22 NOTE — Discharge Instructions (Signed)
    Supplemental Discharge Instructions for  Pacemaker/Defibrillator Patients  Activity No heavy lifting or vigorous activity with your left/right arm for 6 to 8 weeks.  Do not raise your left/right arm above your head for one week.  Gradually raise your affected arm as drawn below.             08/25/2019                 08/26/2019                  08/27/2019                 08/28/2019 __  NO DRIVING 6 months or until cleared to by your doctor  WOUND CARE - Keep the wound area clean and dry.  Do not get this area wet, no showers until cleared to at your wound check visit. - The tape/steri-strips on your wound will fall off; do not pull them off.  No bandage is needed on the site.  DO  NOT apply any creams, oils, or ointments to the wound area. - If you notice any drainage or discharge from the wound, any swelling or bruising at the site, or you develop a fever > 101? F after you are discharged home, call the office at once.  Special Instructions - You are still able to use cellular telephones; use the ear opposite the side where you have your pacemaker/defibrillator.  Avoid carrying your cellular phone near your device. - When traveling through airports, show security personnel your identification card to avoid being screened in the metal detectors.  Ask the security personnel to use the hand wand. - Avoid arc welding equipment, MRI testing (magnetic resonance imaging), TENS units (transcutaneous nerve stimulators).  Call the office for questions about other devices. - Avoid electrical appliances that are in poor condition or are not properly grounded. - Microwave ovens are safe to be near or to operate.

## 2019-08-22 NOTE — Discharge Summary (Signed)
Physician Discharge Summary  Parv Pezzullo IRC:[id number] DOB: 08/06/1935 DOA: 08/21/2019  PCP: [facility]  Admit date: 08/21/2019 Discharge date: 08/22/2019  Admitted From: Home Disposition:  Home  Recommendations for Outpatient Follow-up:  1. Follow up with [facility] in 1-2 weeks 2.   Home Health:Yes Equipment/Devices:None  Discharge Condition:Stable CODE STATUS:Full Diet recommendation: Heart Healthy   Brief/Interim Summary:  84 y.o. male past medical history of chronic kidney disease stage IV, AVB, prostate cancer, diabetes mellitus type 2 colon cancer status post resection CVA and possibly recently diagnosed with atrial fibrillation on Coumadin comes in with syncope likely due to Mobitz type II heart block.  EP was consulted for possible PPM  Discharge Diagnoses:  Principal Problem:   Syncope Active Problems:   Diabetes mellitus type 2 in obese [facility])   Essential hypertension   CKD (chronic kidney disease) stage 3, GFR 30-59 ml/min   OSA on CPAP   Mobitz type 2 second degree AV block  Syncope likely due to Mobitz 2 heart block: Anticoagulation was held EP was consulted recommended pacemaker which was done on 08/21/2019. He will follow up with EP as an outpatient. They recommended to resume Coumadin and Plavix.  History of CVA: Continue Plavix and Eliquis as an outpatient. Continue Plavix.  Chronic kidney disease stage IV: Creatinine remained at baseline.  Insulin-dependent diabetes mellitus type 2: With an A1c of 7.3 on 06/14/2019. No change made to his medication.  Essential hypertension: Blood pressure is fairly controlled, he will resume his antihypertensive medication as an outpatient no changes were made to his medication.  Discharge Instructions  Discharge Instructions    Diet - low sodium heart healthy   Complete by: As directed    Increase activity slowly   Complete by: As directed    No wound care   Complete by: As directed      Allergies  as of 08/22/2019      Reactions   Metformin And Related Hives   Penicillins Hives   Did it involve swelling of the face/tongue/throat, SOB, or low BP? Yes Did it involve sudden or severe rash/hives, skin peeling, or any reaction on the inside of your mouth or nose? No Did you need to seek medical attention at a hospital or doctor's office? Yes When did it last happen?1968 If all above answers are "NO", may proceed with cephalosporin use.      Medication List    TAKE these medications   atorvastatin 80 MG tablet Commonly known as: LIPITOR Take 40 mg by mouth at bedtime.   calcium citrate 950 (200 Ca) MG tablet Commonly known as: CALCITRATE - dosed in mg elemental calcium Take 400 mg of elemental calcium by mouth 2 (two) times daily.   clopidogrel 75 MG tablet Commonly known as: PLAVIX Take 1 tablet (75 mg total) by mouth daily.   ferrous sulfate 325 (65 FE) MG tablet Take 325 mg by mouth every Monday, Wednesday, and Friday.   fluticasone 50 MCG/ACT nasal spray Commonly known as: FLONASE Place 1 spray into both nostrils daily as needed for allergies or rhinitis.   glucose 4 GM chewable tablet Chew 1 tablet by mouth as needed for low blood sugar.   lisinopril-hydrochlorothiazide 20-12.5 MG tablet Commonly known as: ZESTORETIC Take 1 tablet by mouth daily.   mometasone 220 MCG/INH inhaler Commonly known as: ASMANEX Inhale 2 puffs into the lungs at bedtime.   NovoLIN 70/30 (70-30) 100 UNIT/ML injection Generic drug: insulin NPH-regular Human Inject 10-70 Units into the skin  See admin instructions. Inject 70 units into the skin in the morning and 25-26 units in the evening   polyethylene glycol 17 g packet Commonly known as: MIRALAX / GLYCOLAX Take 17 g by mouth daily as needed for mild constipation or moderate constipation.   PRESCRIPTION MEDICATION CPAP- At bedtime   primidone 50 MG tablet Commonly known as: MYSOLINE Take 100-150 mg by mouth See admin  instructions. Take 100 mg by mouth in the morning and 150 mg at bedtime   ProAir HFA 108 (90 Base) MCG/ACT inhaler Generic drug: albuterol Inhale 2 puffs into the lungs every 6 (six) hours as needed for wheezing or shortness of breath.   Refresh Plus 0.5 % Soln Generic drug: Carboxymethylcellulose Sod PF Place 1 drop into both eyes in the morning, at noon, and at bedtime.   Vitamin D-3 25 MCG (1000 UT) Caps Take 1,000 Units by mouth daily.   warfarin 10 MG tablet Commonly known as: COUMADIN Take 10 mg by mouth daily at 6 PM.       Follow-up Information    [facility] Follow up.   Specialty: Cardiology Why: 09/04/2019 @ 12:00PM (noon), wound check visit Contact information: [address], [address] [place]       Constance Haw, MD Follow up.   Specialty: Cardiology Why: 11/29/2019 @ 1:45PM Contact information: [place]  [id number] [phone]        [facility]   Why: Please follow up in a week Contact information: [place] [address] [phone]              Allergies  Allergen Reactions  . Metformin And Related Hives  . Penicillins Hives    Did it involve swelling of the face/tongue/throat, SOB, or low BP? Yes Did it involve sudden or severe rash/hives, skin peeling, or any reaction on the inside of your mouth or nose? No Did you need to seek medical attention at a hospital or doctor's office? Yes When did it last happen?1968 If all above answers are "NO", may proceed with cephalosporin use.     Consultations:  EP   Procedures/Studies: DG Chest 2 View  Result Date: 08/22/2019 CLINICAL DATA:  Cardiac device EXAM: CHEST - 2 VIEW COMPARISON:  Yesterday FINDINGS: Interval dual-chamber pacer implant from the left with leads over the right atrial appendage and right ventricle. No pneumothorax or interval  mediastinal widening. Stable elevation of the right diaphragm. No failure or effusion. IMPRESSION: Dual-chamber pacer implant without complicating feature. Electronically Signed   By: Monte Fantasia M.D.   On: 08/22/2019 06:25   EP PPM/ICD IMPLANT  Result Date: 08/21/2019 SURGEON:  Allegra Lai, MD   PREPROCEDURE DIAGNOSIS:  Mobitz II AV block, syncope   POSTPROCEDURE DIAGNOSIS:  Mobitz II AV block, syncope    PROCEDURES:  1. Pacemaker implantation.  2. Left upper extremity venography.   INTRODUCTION:  Daniel Odom is a 84 y.o. male with a history of bradycardia who presents today for pacemaker implantation.  The patient reports intermittent episodes of dizziness over the past few months.  No reversible causes have been identified.  The patient therefore presents today for pacemaker implantation.   DESCRIPTION OF PROCEDURE:  Informed written consent was obtained, and  the patient was brought to the electrophysiology lab in a fasting state.  The patient required no sedation for the procedure today.  The patients left chest was prepped and  draped in the usual sterile fashion by the EP lab staff. The skin overlying the left deltopectoral region was infiltrated with lidocaine for local analgesia.  A 4-cm incision was made over the left deltopectoral region.  A left subcutaneous pacemaker pocket was fashioned using a combination of sharp and blunt dissection. Electrocautery was required to assure hemostasis.  Left Upper Extremity Venography: A venogram of the left upper extremity was performed, which revealed a large left cephalic vein, which emptied into a large left subclavian vein.  The left axillary vein was moderate in size.  RA/RV Lead Placement: The left axillary vein was therefore cannulated.  Through the left axillary vein, a [employer] model Tendril MRI [id number] (serial number  [id number]) right atrial lead and a [employer] model Tendril MRI [id number] (serial number  [id number]) right ventricular lead  were advanced with fluoroscopic visualization into the right atrial appendage and right ventricular apex positions respectively.  Initial atrial lead P- waves measured 3.3 mV with impedance of 511 ohms and a threshold of 0.6 V at 0.5 msec.  Right ventricular lead R-waves measured 11.9 mV with an impedance of 458 ohms and a threshold of 0.5 V at 0.5 msec.  Both leads were secured to the pectoralis fascia using #2-0 silk over the suture sleeves. Device Placement:  The leads were then connected to a [place] MRI  model [id number] (serial number  [id number] ) pacemaker.  The pocket was irrigated with copious gentamicin solution.  The pacemaker was then placed into the pocket.  The pocket was then closed in 3 layers with 2.0 Vicryl suture for the 3.0 Vicryl suture subcutaneous and subcuticular layers.  Steri-  Strips and a sterile dressing were then applied. EBL<2ml.  There were no early apparent complications.   CONCLUSIONS:  1. Successful implantation of a [employer] Assurity MRI dual-chamber pacemaker for symptomatic bradycardia  2. No early apparent complications.       Will Curt Bears, MD 08/21/2019 3:39 PM   DG Chest Portable 1 View  Result Date: 08/21/2019 CLINICAL DATA:  Recent syncopal episode EXAM: PORTABLE CHEST 1 VIEW COMPARISON:  05/30/2019 FINDINGS: Cardiac shadow is stable. Aortic calcifications are again seen. Lungs are well aerated bilaterally. Elevation of the right hemidiaphragm is again seen. No focal infiltrate or sizable effusion is noted. IMPRESSION: No active disease. Electronically Signed   By: Inez Catalina M.D.   On: 08/21/2019 01:16      Subjective: No complaints feels great wants to go home.  Discharge Exam: Vitals:   08/22/19 0738 08/22/19 1120  BP: (!) 153/74 (!) 134/56  Pulse: 60 62  Resp: 20 20  Temp: 98.3 F (36.8 C) 97.7 F (36.5 C)  SpO2: 100% 99%   Vitals:   08/22/19 0000 08/22/19 0330 08/22/19 0738 08/22/19 1120  BP:  (!) 159/71 (!) 153/74 (!)  134/56  Pulse:  59 60 62  Resp:  20 20 20   Temp:  98.4 F (36.9 C) 98.3 F (36.8 C) 97.7 F (36.5 C)  TempSrc:  Oral Oral Oral  SpO2:  100% 100% 99%  Weight: (!) 120.6 kg     Height:        General: Pt is alert, awake, not in acute distress Cardiovascular: RRR, S1/S2 +, no rubs, no gallops Respiratory: CTA bilaterally, no wheezing, no rhonchi Abdominal: Soft, NT, ND, bowel sounds + Extremities: no edema, no cyanosis    The results of significant diagnostics from this hospitalization (including imaging, microbiology, ancillary and laboratory)  are listed below for reference.     Microbiology: Recent Results (from the past 240 hour(s))  SARS Coronavirus 2 by RT PCR (hospital order, performed in [facility] hospital lab) Nasopharyngeal Nasopharyngeal Swab     Status: None   Collection Time: 08/21/19  1:04 AM   Specimen: Nasopharyngeal Swab  Result Value Ref Range Status   SARS Coronavirus 2 NEGATIVE NEGATIVE Final    Comment: (NOTE) SARS-CoV-2 target nucleic acids are NOT DETECTED.  The SARS-CoV-2 RNA is generally detectable in upper and lower respiratory specimens during the acute phase of infection. The lowest concentration of SARS-CoV-2 viral copies this assay can detect is 250 copies / mL. A negative result does not preclude SARS-CoV-2 infection and should not be used as the sole basis for treatment or other patient management decisions.  A negative result may occur with improper specimen collection / handling, submission of specimen other than nasopharyngeal swab, presence of viral mutation(s) within the areas targeted by this assay, and inadequate number of viral copies (<250 copies / mL). A negative result must be combined with clinical observations, patient history, and epidemiological information.  Fact Sheet for Patients:   StrictlyIdeas.no  Fact Sheet for Healthcare Providers: BankingDealers.co.za  This test is  not yet approved or  cleared by the [place] FDA and has been authorized for detection and/or diagnosis of SARS-CoV-2 by FDA under an Emergency Use Authorization (EUA).  This EUA will remain in effect (meaning this test can be used) for the duration of the COVID-19 declaration under Section 564(b)(1) of the Act, 21 U.S.C. section 360bbb-3(b)(1), unless the authorization is terminated or revoked sooner.  Performed at [facility], [place] [address], [place], [place] [address]      Labs: BNP (last 3 results) Recent Labs    08/21/19 0050  BNP 64.4   Basic Metabolic Panel: Recent Labs  Lab 08/21/19 0050 08/21/19 1309 08/22/19 0527  NA 138 138 138  K 4.0 4.3 4.0  CL 103 105 104  CO2 26 29 25   GLUCOSE 159* 137* 149*  BUN 30* 26* 25*  CREATININE 2.04* 1.81* 1.74*  CALCIUM 9.0 9.0 9.0  MG 2.0  --  1.7   Liver Function Tests: Recent Labs  Lab 08/21/19 0050  AST 18  ALT 14  ALKPHOS 49  BILITOT 0.4  PROT 7.2  ALBUMIN 3.6   No results for input(s): LIPASE, AMYLASE in the last 168 hours. No results for input(s): AMMONIA in the last 168 hours. CBC: Recent Labs  Lab 08/21/19 0050 08/21/19 1309 08/22/19 0527  WBC 4.8 4.2 5.5  NEUTROABS 2.4  --   --   HGB 8.9* 9.2* 9.4*  HCT 25.6* 27.5* 27.5*  MCV 82.3 83.6 81.8  PLT 242 253 225   Cardiac Enzymes: No results for input(s): CKTOTAL, CKMB, CKMBINDEX, TROPONINI in the last 168 hours. BNP: Invalid input(s): POCBNP CBG: Recent Labs  Lab 08/21/19 2128 08/21/19 2354 08/22/19 0348 08/22/19 0734 08/22/19 1116  GLUCAP 177* 134* 138* 143* 206*   D-Dimer No results for input(s): DDIMER in the last 72 hours. Hgb A1c No results for input(s): HGBA1C in the last 72 hours. Lipid Profile No results for input(s): CHOL, HDL, LDLCALC, TRIG, CHOLHDL, LDLDIRECT in the last 72 hours. Thyroid function studies Recent Labs    08/21/19 1309  TSH 1.214   Anemia work up Recent Labs    08/21/19 1309 08/21/19 1310   VITAMINB12 245  --   FOLATE 11.9  --   FERRITIN  172  --   TIBC 230*  --   IRON 70  --   RETICCTPCT  --  1.1   Urinalysis    Component Value Date/Time   COLORURINE YELLOW 08/03/2018 2313   APPEARANCEUR CLEAR 08/03/2018 2313   LABSPEC 1.019 08/03/2018 2313   PHURINE 6.0 08/03/2018 2313   GLUCOSEU NEGATIVE 08/03/2018 2313   HGBUR NEGATIVE 08/03/2018 2313   BILIRUBINUR NEGATIVE 08/03/2018 2313   KETONESUR NEGATIVE 08/03/2018 2313   PROTEINUR 30 (A) 08/03/2018 2313   NITRITE NEGATIVE 08/03/2018 2313   LEUKOCYTESUR NEGATIVE 08/03/2018 2313   Sepsis Labs Invalid input(s): PROCALCITONIN,  WBC,  LACTICIDVEN Microbiology Recent Results (from the past 240 hour(s))  SARS Coronavirus 2 by RT PCR (hospital order, performed in [facility] hospital lab) Nasopharyngeal Nasopharyngeal Swab     Status: None   Collection Time: 08/21/19  1:04 AM   Specimen: Nasopharyngeal Swab  Result Value Ref Range Status   SARS Coronavirus 2 NEGATIVE NEGATIVE Final    Comment: (NOTE) SARS-CoV-2 target nucleic acids are NOT DETECTED.  The SARS-CoV-2 RNA is generally detectable in upper and lower respiratory specimens during the acute phase of infection. The lowest concentration of SARS-CoV-2 viral copies this assay can detect is 250 copies / mL. A negative result does not preclude SARS-CoV-2 infection and should not be used as the sole basis for treatment or other patient management decisions.  A negative result may occur with improper specimen collection / handling, submission of specimen other than nasopharyngeal swab, presence of viral mutation(s) within the areas targeted by this assay, and inadequate number of viral copies (<250 copies / mL). A negative result must be combined with clinical observations, patient history, and epidemiological information.  Fact Sheet for Patients:   StrictlyIdeas.no  Fact Sheet for Healthcare  Providers: BankingDealers.co.za  This test is not yet approved or  cleared by the [place] FDA and has been authorized for detection and/or diagnosis of SARS-CoV-2 by FDA under an Emergency Use Authorization (EUA).  This EUA will remain in effect (meaning this test can be used) for the duration of the COVID-19 declaration under Section 564(b)(1) of the Act, 21 U.S.C. section 360bbb-3(b)(1), unless the authorization is terminated or revoked sooner.  Performed at [facility], [place] [address], [place], [place] [address]      Time coordinating discharge: Over 30 minutes  SIGNED:   [name], MD  [facility] 08/22/2019, 12:29 PM Pager   If 7PM-7AM, please contact night-coverage [email or web address] Password [email or web address]

## 2019-08-22 NOTE — Progress Notes (Addendum)
Progress Note  Patient Name: Daniel Odom Date of Encounter: 08/22/2019  [facility] Cardiologist: [facility]  Subjective   "I feel so good today", "so much better"  Denies SOB, CP, no site discomfort  Inpatient Medications    Scheduled Meds:  atorvastatin  40 mg Oral QHS   heparin  5,000 Units Subcutaneous Q8H   insulin aspart  0-9 Units Subcutaneous Q4H   Continuous Infusions:   PRN Meds: acetaminophen **OR** acetaminophen, albuterol, ondansetron **OR** ondansetron (ZOFRAN) IV, polyethylene glycol, senna-docusate   Vital Signs    Vitals:   08/21/19 2356 08/22/19 0000 08/22/19 0330 08/22/19 0738  BP: (!) 179/82  (!) 159/71 (!) 153/74  Pulse: 61  59 60  Resp: 20  20 20   Temp: 98.8 F (37.1 C)  98.4 F (36.9 C) 98.3 F (36.8 C)  TempSrc: Oral  Oral Oral  SpO2: 100%  100% 100%  Weight:  (!) 120.6 kg    Height:        Intake/Output Summary (Last 24 hours) at 08/22/2019 0926 Last data filed at 08/22/2019 0800 Gross per 24 hour  Intake 553.75 ml  Output 875 ml  Net -321.25 ml   Last 3 Weights 08/22/2019 08/21/2019 06/28/2019  Weight (lbs) 265 lb 14.4 oz 270 lb 274 lb  Weight (kg) 120.611 kg 122.471 kg 124.286 kg      Telemetry    AV paced - Personally Reviewed  ECG    AV paced - Personally Reviewed  Physical Exam   GEN: No acute distress.   Neck: No JVD Cardiac: RRR, no murmurs, rubs, or gallops.  Respiratory: CTA b/l. GI: Soft, nontender, non-distended  MS: No edema; No deformity. Neuro:  Nonfocal  Psych: Normal affect   L chest: pacer site is clean and dry, no ecchymosis or hematoma  Labs    High Sensitivity Troponin:   Recent Labs  Lab 08/21/19 0050 08/21/19 1309  TROPONINIHS 16 21*      Chemistry Recent Labs  Lab 08/21/19 0050 08/21/19 1309 08/22/19 0527  NA 138 138 138  K 4.0 4.3 4.0  CL 103 105 104  CO2 26 29 25   GLUCOSE 159* 137* 149*  BUN 30* 26* 25*  CREATININE 2.04* 1.81* 1.74*  CALCIUM 9.0 9.0 9.0  PROT 7.2   --   --   ALBUMIN 3.6  --   --   AST 18  --   --   ALT 14  --   --   ALKPHOS 49  --   --   BILITOT 0.4  --   --   GFRNONAA 29* 34* 36*  GFRAA 34* 39* 41*  ANIONGAP 9 4* 9     Hematology Recent Labs  Lab 08/21/19 0050 08/21/19 0050 08/21/19 1309 08/21/19 1310 08/22/19 0527  WBC 4.8  --  4.2  --  5.5  RBC 3.11*   < > 3.29* 3.21* 3.36*  HGB 8.9*  --  9.2*  --  9.4*  HCT 25.6*  --  27.5*  --  27.5*  MCV 82.3  --  83.6  --  81.8  MCH 28.6  --  28.0  --  28.0  MCHC 34.8  --  33.5  --  34.2  RDW 14.8  --  14.7  --  14.5  PLT 242  --  253  --  225   < > = values in this interval not displayed.    BNP Recent Labs  Lab 08/21/19 0050  BNP 50.5  DDimer No results for input(s): DDIMER in the last 168 hours.   Radiology    DG Chest 2 View Result Date: 08/22/2019 CLINICAL DATA:  Cardiac device EXAM: CHEST - 2 VIEW COMPARISON:  Yesterday FINDINGS: Interval dual-chamber pacer implant from the left with leads over the right atrial appendage and right ventricle. No pneumothorax or interval mediastinal widening. Stable elevation of the right diaphragm. No failure or effusion. IMPRESSION: Dual-chamber pacer implant without complicating feature. Electronically Signed   By: Monte Fantasia M.D.   On: 08/22/2019 06:25      Cardiac Studies   05/31/2019: TTE  IMPRESSIONS  1. Left ventricular ejection fraction, by estimation, is 55%. The left  ventricle has normal function. Difficult study for wall motion even with  Definity. There appears to be septal-lateral dyssynchrony consistent with  LBBB, but overall EF appears to be  in normal range. There is mild left ventricular hypertrophy. Left  ventricular diastolic parameters are consistent with Grade I diastolic  dysfunction (impaired relaxation).   2. Right ventricular systolic function is normal. The right ventricular  size is normal. Tricuspid regurgitation signal is inadequate for assessing  PA pressure.   3. The mitral valve is  normal in structure. No evidence of mitral valve  regurgitation.   4. The aortic valve is tricuspid. Aortic valve regurgitation is not  visualized. Mild aortic valve sclerosis is present, with no evidence of  aortic valve stenosis.   5. IVC not visualized.   Patient Profile     84 y.o. male with a hx of HTN, DM, CKD (III), TIA May 2021, h/o colon and prostate cancer admitted with recurrent syncope.  Assessment & Plan      1. Syncope     orthostatics negative yesterday     Unusual presentation of syncope 2. Known baseline conduction system disease     Mobitz II noted here intermittently         No reversible causes  S/p PPM yesterday with Dr. Curt Bears The patient feels much better today, also looks better, more animated  AVpaced on telemetry CXR this AM without ptx Device check this morning with stable measurements Site is stable Wound care and activity instructions were discussed with the patient and his wife at bedside by Dr. Curt Bears Discussed initial follow up is in place with [place], though afterwards can transfer pacer care to [facility] should he want to   Dr. Curt Bears discussed [place] law, no driving for 25mo.    OK to discharge from EP perspective   3. Wife reported [facility]     CHA2DS2Vasc is 35, on warfarin     INR yesterday 1.6, none given here     OK to resume warfarin, please avoid rapid rise in INR      For questions or updates, please contact [facility] Please consult [email or web address] for contact info under        Signed, Baldwin Jamaica, PA-C  08/22/2019, 9:26 AM    I have seen and examined this patient with Tommye Standard.  Agree with above, note added to reflect my findings.  On exam, RRR, no murmurs.  She is now status post [employer] pacemaker for [employer] II av block.  Device functioning appropriately.  Chest x-ray and interrogation without issue.  OK for discharge today. Follow up in device clinic to be arranged.  Raynell Scott M. Blayklee Mable  MD 08/22/2019 11:39 AM

## 2019-08-22 NOTE — Progress Notes (Signed)
D/C instructions given and reviewed. No questions voiced at this time. Tele and IV removed. Tolerated well. Spouse will arrive between 2-3pm to transport pt home.

## 2019-09-04 ENCOUNTER — Ambulatory Visit (INDEPENDENT_AMBULATORY_CARE_PROVIDER_SITE_OTHER): Payer: No Typology Code available for payment source | Admitting: Emergency Medicine

## 2019-09-04 ENCOUNTER — Other Ambulatory Visit: Payer: Self-pay

## 2019-09-04 DIAGNOSIS — I441 Atrioventricular block, second degree: Secondary | ICD-10-CM

## 2019-09-04 DIAGNOSIS — Z950 Presence of cardiac pacemaker: Secondary | ICD-10-CM

## 2019-09-04 LAB — CUP PACEART INCLINIC DEVICE CHECK
Battery Remaining Longevity: 123 mo
Battery Voltage: 3.1 V
Brady Statistic RA Percent Paced: 72 %
Brady Statistic RV Percent Paced: 99.16 %
Date Time Interrogation Session: 20210810130534
Implantable Lead Implant Date: 20210727
Implantable Lead Implant Date: 20210727
Implantable Lead Location: 753859
Implantable Lead Location: 753860
Implantable Pulse Generator Implant Date: 20210727
Lead Channel Impedance Value: 437.5 Ohm
Lead Channel Impedance Value: 437.5 Ohm
Lead Channel Pacing Threshold Amplitude: 0.375 V
Lead Channel Pacing Threshold Amplitude: 0.5 V
Lead Channel Pacing Threshold Pulse Width: 0.5 ms
Lead Channel Pacing Threshold Pulse Width: 0.5 ms
Lead Channel Sensing Intrinsic Amplitude: 2.6 mV
Lead Channel Sensing Intrinsic Amplitude: 6.1 mV
Lead Channel Setting Pacing Amplitude: 0.625
Lead Channel Setting Pacing Amplitude: 1.5 V
Lead Channel Setting Pacing Pulse Width: 0.5 ms
Lead Channel Setting Sensing Sensitivity: 2 mV
Pulse Gen Model: 2272
Pulse Gen Serial Number: 3848418

## 2019-09-04 NOTE — Progress Notes (Signed)
Wound check appointment. Steri-strips removed. Wound without redness or edema. Incision edges approximated, wound well healed. Normal device function. Thresholds, sensing, and impedances consistent with implant measurements. Auto capture programmed on for extra safety margin until 3 month visit. Histogram distribution appropriate for patient and level of activity. 29 AMS episodes (<1%), available EGMs show FFRWs, none since PVAB extended on 08/22/19 at next-day check. No high ventricular rates noted. Patient educated about wound care, arm mobility, lifting restrictions, and Merlin monitor. Merlin on 11/21/19 and ROV with Dr. Curt Bears on 11/29/19.

## 2019-09-24 ENCOUNTER — Telehealth: Payer: Self-pay

## 2019-09-24 NOTE — Telephone Encounter (Signed)
Pt transferred to [facility].

## 2019-11-06 ENCOUNTER — Ambulatory Visit: Payer: Medicare Other | Admitting: Adult Health

## 2019-11-08 ENCOUNTER — Encounter: Payer: No Typology Code available for payment source | Admitting: Cardiology

## 2019-11-29 ENCOUNTER — Other Ambulatory Visit: Payer: Self-pay

## 2019-11-29 ENCOUNTER — Ambulatory Visit (INDEPENDENT_AMBULATORY_CARE_PROVIDER_SITE_OTHER): Payer: Self-pay | Admitting: Cardiology

## 2019-11-29 ENCOUNTER — Encounter: Payer: Self-pay | Admitting: Cardiology

## 2019-11-29 VITALS — BP 124/62 | HR 73 | Ht 67.0 in | Wt 278.0 lb

## 2019-11-29 DIAGNOSIS — I441 Atrioventricular block, second degree: Secondary | ICD-10-CM

## 2019-11-29 NOTE — Progress Notes (Signed)
Electrophysiology Office Note   Date:  11/29/2019   ID:  Daniel Odom, DOB [birth year], MRN [id number]  PCP:  [facility]  Cardiologist:   Primary Electrophysiologist:  Avis Tirone Meredith Leeds, MD    Chief Complaint: pacemaker   History of Present Illness: Daniel Odom is a 84 y.o. Odom who is being seen today for the evaluation of pacemaker at the request of [facility]. Presenting today for electrophysiology evaluation.  He has a history of hypertension, diabetes, CKD stage III, CVA, prostate and colon cancer.  He presented to the hospital July 2021 and was noted to have advanced heart block and syncope.  He is now status post [place] dual-chamber pacemaker implanted 08/21/2019.  Today, he denies symptoms of palpitations, chest pain, shortness of breath, orthopnea, PND, lower extremity edema, claudication, dizziness, presyncope, syncope, bleeding, or neurologic sequela. The patient is tolerating medications without difficulties.  He has not had any further episodes of syncope.  He is able to do all of his daily activities without restriction.   Past Medical History:  Diagnosis Date  . Chronic kidney disease (CKD), stage III (moderate) (HCC)   . Colon cancer ([place])   . Diabetes mellitus without complication ([place])   . Hypertension   . Presence of permanent cardiac pacemaker 08/21/2019  . Prostate cancer [facility])    Past Surgical History:  Procedure Laterality Date  . COLON SURGERY    . INSERT / REPLACE / REMOVE PACEMAKER  08/21/2019  . LAPAROSCOPIC LYSIS OF ADHESIONS N/A 08/08/2018   Procedure: LAPAROSCOPIC LYSIS OF ADHESIONS;  Surgeon: Kinsinger, Arta Bruce, MD;  Location: [place];  Service: General;  Laterality: N/A;  . LAPAROTOMY N/A 08/08/2018   Procedure: Exploratory Laparotomy;  Surgeon: Kieth Brightly Arta Bruce, MD;  Location: [place];  Service: General;  Laterality: N/A;  . LYSIS OF ADHESION N/A 08/08/2018   Procedure: Lysis of adhesions with repair of small  intestine;  Surgeon: Kieth Brightly Arta Bruce, MD;  Location: [place];  Service: General;  Laterality: N/A;  . PACEMAKER IMPLANT N/A 08/21/2019   Procedure: PACEMAKER IMPLANT;  Surgeon: Constance Haw, MD;  Location: [place] [facility];  Service: Cardiovascular;  Laterality: N/A;     Current Outpatient Medications  Medication Sig Dispense Refill  . albuterol (PROAIR HFA) 108 (90 Base) MCG/ACT inhaler Inhale 2 puffs into the lungs every 6 (six) hours as needed for wheezing or shortness of breath.    Marland Kitchen atorvastatin (LIPITOR) 80 MG tablet Take 40 mg by mouth at bedtime.     . calcium citrate (CALCITRATE - DOSED IN MG ELEMENTAL CALCIUM) 950 (200 Ca) MG tablet Take 400 mg of elemental calcium by mouth 2 (two) times daily.    . Carboxymethylcellulose Sod PF (REFRESH PLUS) 0.5 % SOLN Place 1 drop into both eyes in the morning, at noon, and at bedtime.     . Cholecalciferol (VITAMIN D-3) 25 MCG (1000 UT) CAPS Take 1,000 Units by mouth daily.    . clopidogrel (PLAVIX) 75 MG tablet Take 1 tablet (75 mg total) by mouth daily. 30 tablet 6  . ferrous sulfate 325 (65 FE) MG tablet Take 325 mg by mouth every Monday, Wednesday, and Friday.    . fluticasone (FLONASE) 50 MCG/ACT nasal spray Place 1 spray into both nostrils daily as needed for allergies or rhinitis.    Marland Kitchen glucose 4 GM chewable tablet Chew 1 tablet by mouth as needed for low blood sugar.    . insulin NPH-regular Human (NOVOLIN 70/30) (70-30)  100 UNIT/ML injection Inject 10-70 Units into the skin See admin instructions. Inject 70 units into the skin in the morning and 25-26 units in the evening    . lisinopril-hydrochlorothiazide (ZESTORETIC) 20-12.5 MG tablet Take 1 tablet by mouth daily.    . mometasone (ASMANEX) 220 MCG/INH inhaler Inhale 2 puffs into the lungs at bedtime.    . polyethylene glycol (MIRALAX / GLYCOLAX) 17 g packet Take 17 g by mouth daily as needed for mild constipation or moderate constipation.     Marland Kitchen PRESCRIPTION MEDICATION CPAP-  At bedtime    . primidone (MYSOLINE) 50 MG tablet Take 100-150 mg by mouth See admin instructions. Take 100 mg by mouth in the morning and 150 mg at bedtime     No current facility-administered medications for this visit.    Allergies:   Metformin and related and Penicillins   Social History:  The patient  reports that he has never smoked. He has never used smokeless tobacco. He reports previous alcohol use. He reports that he does not use drugs.   Family History:  The patient's family history includes Diabetes Mellitus II in his brother.    ROS:  Please see the history of present illness.   Otherwise, review of systems is positive for none.   All other systems are reviewed and negative.    PHYSICAL EXAM: VS:  BP 124/62   Pulse 73   Ht 5\' 7"  (1.702 m)   Wt 278 lb (126.1 kg)   SpO2 99%   BMI 43.54 kg/m  , BMI Body mass index is 43.54 kg/m. GEN: Well nourished, well developed, in no acute distress  HEENT: normal  Neck: no JVD, carotid bruits, or masses Cardiac: RRR; no murmurs, rubs, or gallops,no edema  Respiratory:  clear to auscultation bilaterally, normal work of breathing GI: soft, nontender, nondistended, + BS MS: no deformity or atrophy  Skin: warm and dry, device pocket is well healed Neuro:  Strength and sensation are intact Psych: euthymic mood, full affect  EKG:  EKG is ordered today. Personal review of the ekg ordered shows sinus rhythm, ventricular paced  Device interrogation is reviewed today in detail.  See PaceArt for details.   Recent Labs: 08/21/2019: ALT 14; B Natriuretic Peptide 50.5; TSH 1.214 08/22/2019: BUN 25; Creatinine, Ser 1.74; Hemoglobin 9.4; Magnesium 1.7; Platelets 225; Potassium 4.0; Sodium 138    Lipid Panel     Component Value Date/Time   CHOL 189 05/31/2019 0429   TRIG 162 (H) 05/31/2019 0429   HDL 43 05/31/2019 0429   CHOLHDL 4.4 05/31/2019 0429   VLDL 32 05/31/2019 0429   LDLCALC 114 (H) 05/31/2019 0429     Wt Readings from  Last 3 Encounters:  11/29/19 278 lb (126.1 kg)  08/22/19 (!) 265 lb 14.4 oz (120.6 kg)  06/28/19 274 lb (124.3 kg)      Other studies Reviewed: Additional studies/ records that were reviewed today include: TTE 05/31/2019 Review of the above records today demonstrates:  1. Left ventricular ejection fraction, by estimation, is 55%. The left  ventricle has normal function. Difficult study for wall motion even with  Definity. There appears to be septal-lateral dyssynchrony consistent with  LBBB, but overall EF appears to be  in normal range. There is mild left ventricular hypertrophy. Left  ventricular diastolic parameters are consistent with Grade I diastolic  dysfunction (impaired relaxation).  2. Right ventricular systolic function is normal. The right ventricular  size is normal. Tricuspid regurgitation signal is inadequate for  assessing  PA pressure.  3. The mitral valve is normal in structure. No evidence of mitral valve  regurgitation.  4. The aortic valve is tricuspid. Aortic valve regurgitation is not  visualized. Mild aortic valve sclerosis is present, with no evidence of  aortic valve stenosis.  5. IVC not visualized.   ASSESSMENT AND PLAN:  1.  Mobitz 2 AV block with syncope: Status post [place] dual-chamber pacemaker implanted 08/21/2019.  Device functioning appropriately.  No changes at this time.  He Daniel Odom follow-up at the [facility] electrophysiology.  2.  Hypertension: Currently well controlled  Current medicines are reviewed at length with the patient today.   The patient does not have concerns regarding his medicines.  The following changes were made today:  none  Labs/ tests ordered today include:  Orders Placed This Encounter  Procedures  . EKG 12-Lead     Disposition:   FU with Reesa Gotschall as needed months  Signed, Skyylar Kopf Meredith Leeds, MD  11/29/2019 2:33 PM     [facility] [address] [place] [address] [phone]  (office) [phone] (fax)

## 2019-11-29 NOTE — Patient Instructions (Addendum)
Medication Instructions:  Your physician recommends that you continue on your current medications as directed. Please refer to the Current Medication list given to you today.  *If you need a refill on your cardiac medications before your next appointment, please call your pharmacy*   Lab Work: None ordered   Testing/Procedures: None ordered   Follow-Up: At [facility], you and your health needs are our priority.  As part of our continuing mission to provide you with exceptional heart care, we have created designated Provider Care Teams.  These Care Teams include your primary Cardiologist (physician) and Advanced Practice Providers (APPs -  Physician Assistants and Nurse Practitioners) who all work together to provide you with the care you need, when you need it.  Your next appointment:   As needed   The format for your next appointment:   In Person  Provider:   Will Camnitz, MD    Thank you for choosing [facility]!!   Merlean Pizzini, RN [phone]   Other Instructions     

## 2020-10-20 IMAGING — CT CT LUMBAR SPINE WITHOUT CONTRAST
3 series · 12 of 33 positions shown, 14 images · non-contrast
Comparison: None.

CLINICAL DATA: Abdominal pain.  Fall with back pain.

EXAM:
CT LUMBAR SPINE WITHOUT CONTRAST
TECHNIQUE: Multidetector CT imaging of the lumbar spine was performed without
intravenous contrast administration. Multiplanar CT image
reconstructions were also generated.

[Series 4: l-spine axial_st · axial · 0.34mm/px · z∈[-264,-82]mm · 4 of 133 slices shown, 5 images]
[im 21/133  soft-tissue]
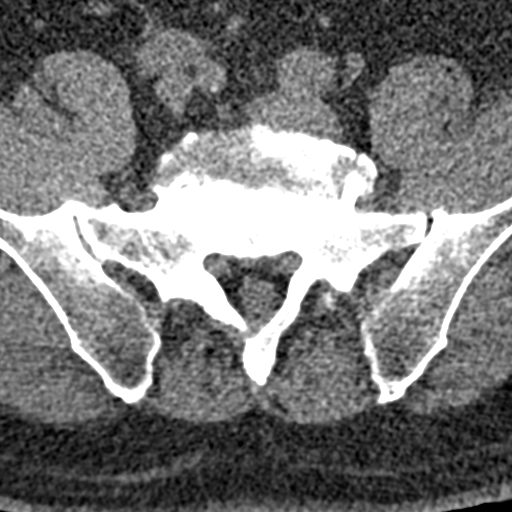
[im 21/133  bone]
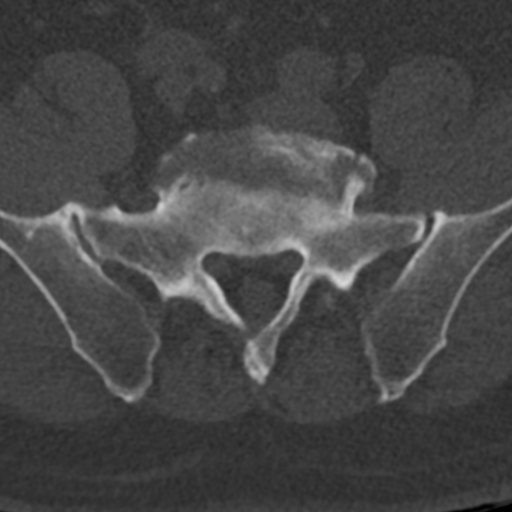
[im 51/133  bone]
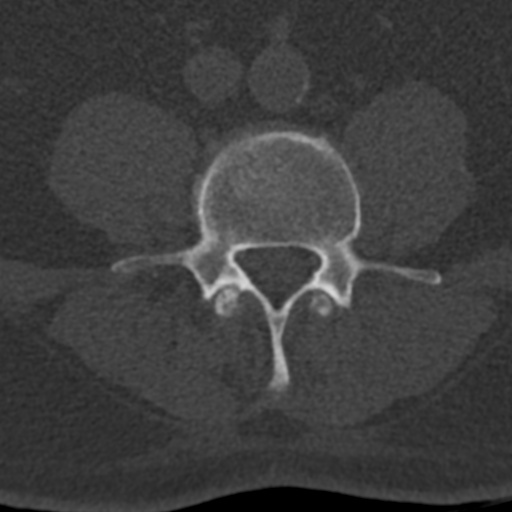
[im 82/133  bone]
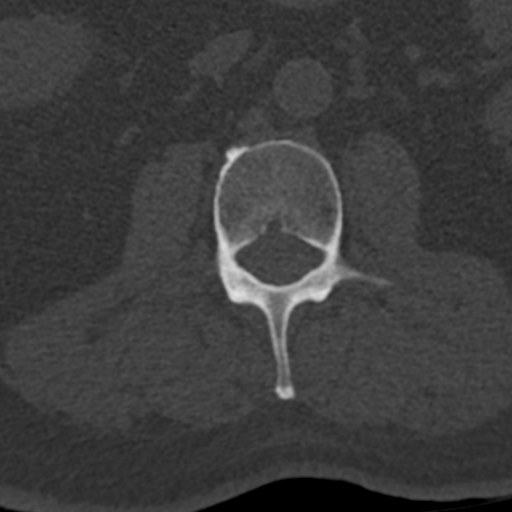
[im 112/133  bone]
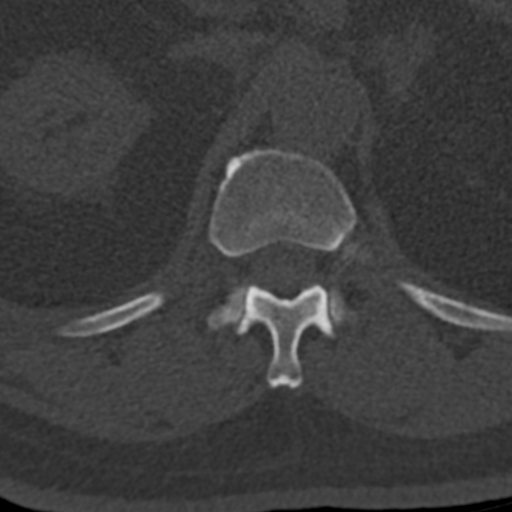

[Series 5: l-spine coronal · coronal · 0.34mm/px · 3 of 88 slices shown]
[im 18/88  bone]
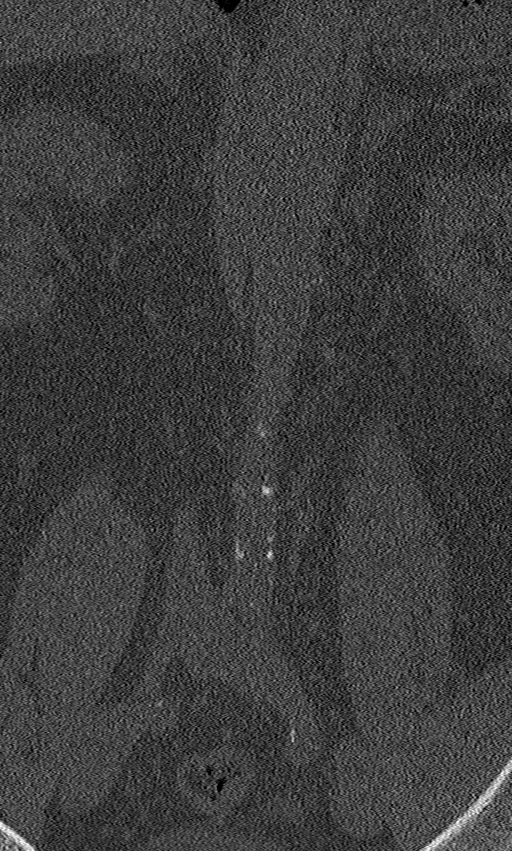
[im 35/88  bone]
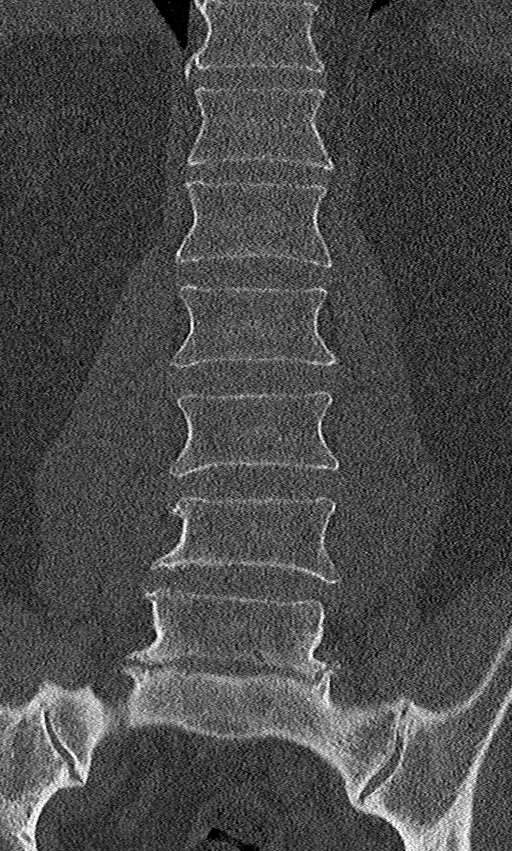
[im 53/88  bone]
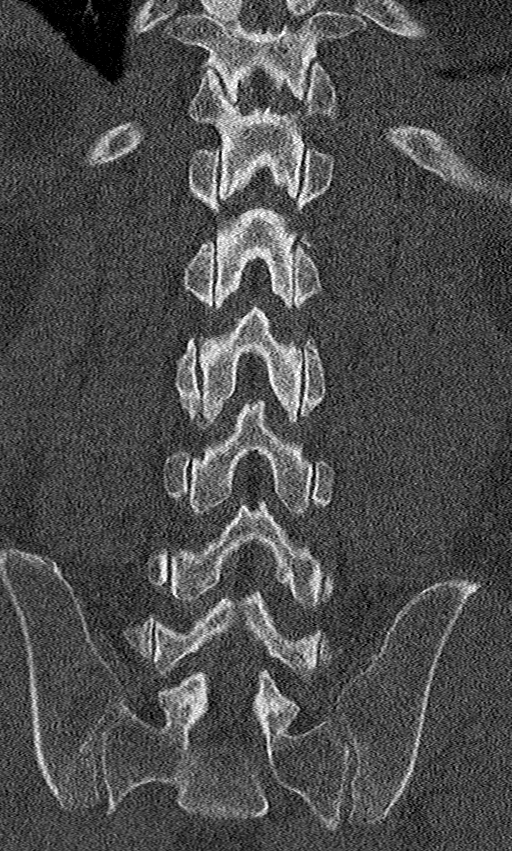

[Series 6: l-spine sagittal · sagittal · 0.34mm/px · 5 of 88 slices shown, 6 images]
[im 30/88  bone]
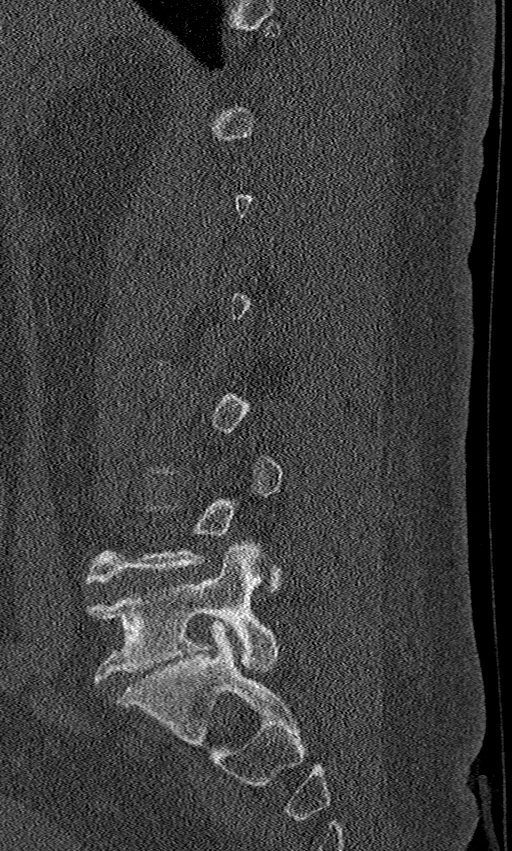
[im 37/88  bone]
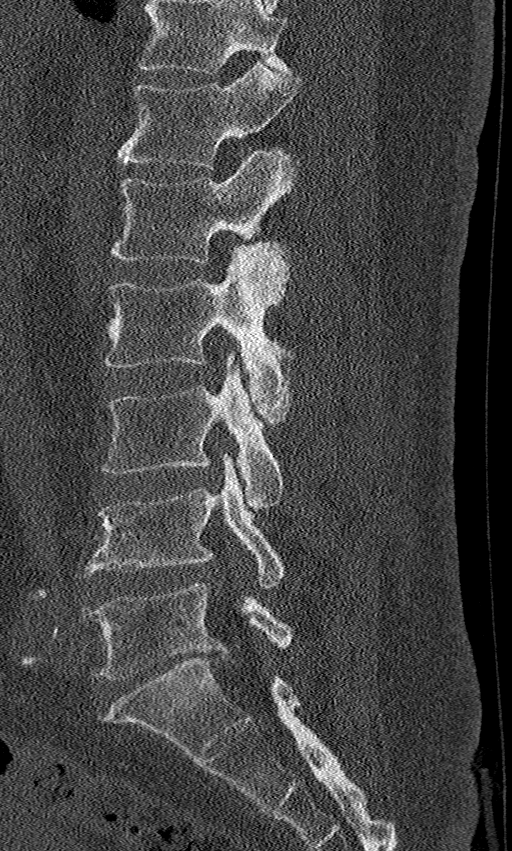
[im 44/88  soft-tissue]
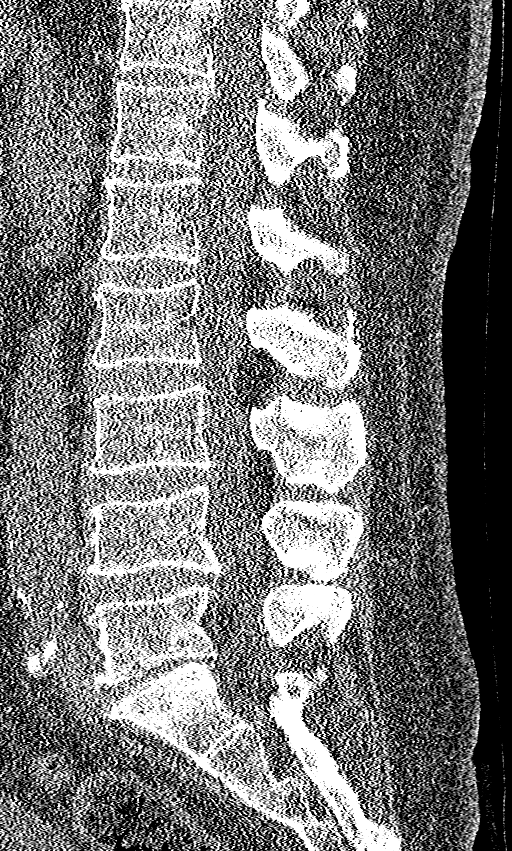
[im 44/88  bone]
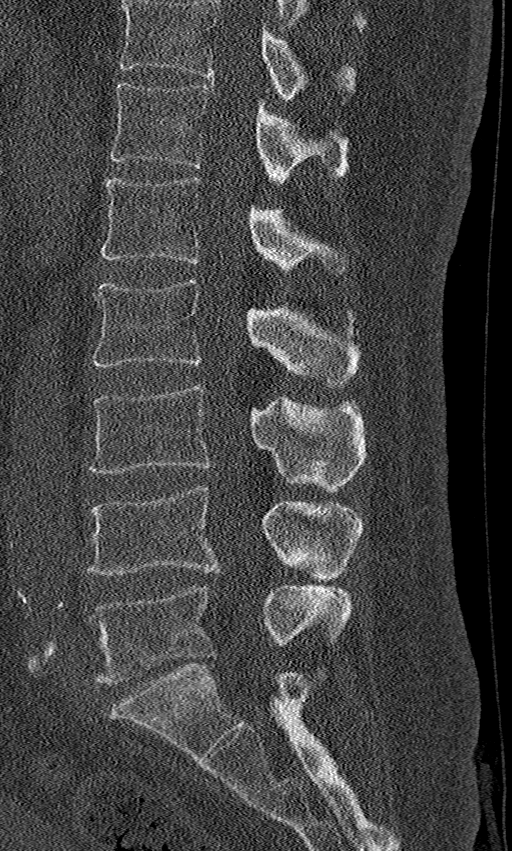
[im 51/88  bone]
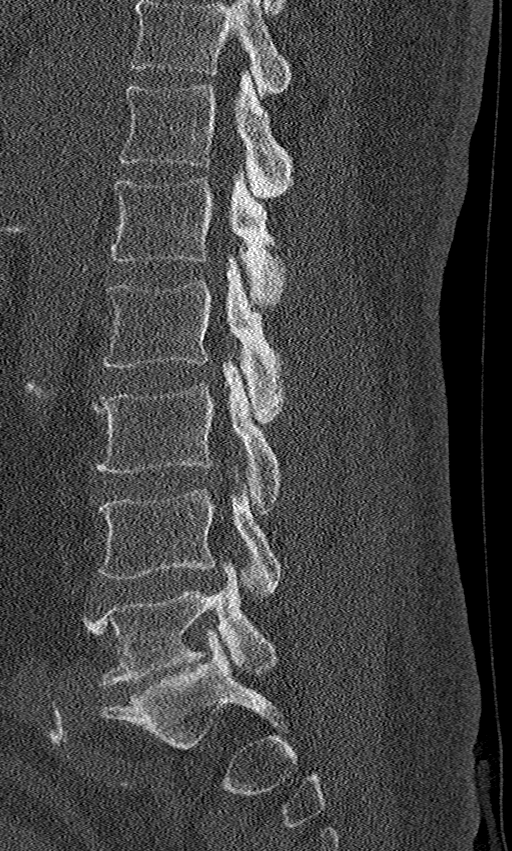
[im 59/88  bone]
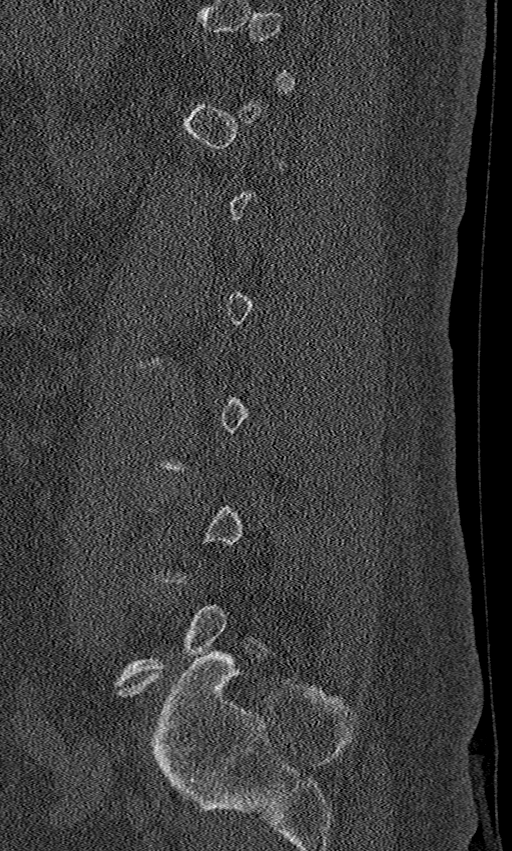

[12 of 33 positions shown; findings below may reference images not displayed]

FINDINGS: Segmentation: 5 lumbar type vertebral bodies.

Alignment: Straightening of the normal lumbar lordosis.

Vertebrae: No fracture or primary bone lesion.

Paraspinal and other soft tissues: See results of abdominal CT.

Disc levels: No significant disc space finding L3-4 or above.
Minimal disc bulges. Ordinary facet osteoarthritis. No compressive
canal or foraminal narrowing.

L4-5: Moderate bulging of the disc. Mild facet degeneration. Mild
stenosis the lateral recesses and foramina.

L5-S1: Disc degeneration with disc space narrowing. Chronic endplate
osteophytes more prominent towards the right. Facet osteoarthritis.
Narrowing of the subarticular lateral recess on the right and the
intervertebral foramen on the right that could possibly be
associated with right-sided nerve compression.

Solid bridging osteophytes of the sacroiliac joints.
IMPRESSION: No acute or traumatic lumbar finding.

Degenerative disc disease and degenerative facet disease at L4-5.
Mild stenosis of the lateral recesses and foramina at L4-5. Right
subarticular lateral recess and foraminal narrowing at L5-S1

Chronic bridging sacroiliac osteophytes.

## 2021-01-11 ENCOUNTER — Other Ambulatory Visit: Payer: Self-pay

## 2021-01-11 ENCOUNTER — Encounter (HOSPITAL_BASED_OUTPATIENT_CLINIC_OR_DEPARTMENT_OTHER): Payer: Self-pay

## 2021-01-11 ENCOUNTER — Emergency Department (HOSPITAL_BASED_OUTPATIENT_CLINIC_OR_DEPARTMENT_OTHER): Payer: No Typology Code available for payment source

## 2021-01-11 ENCOUNTER — Emergency Department (HOSPITAL_BASED_OUTPATIENT_CLINIC_OR_DEPARTMENT_OTHER)
Admission: EM | Admit: 2021-01-11 | Discharge: 2021-01-11 | Disposition: A | Discharge status: Home / Self Care | Payer: No Typology Code available for payment source | Attending: Emergency Medicine | Admitting: Emergency Medicine

## 2021-01-11 DIAGNOSIS — Z79899 Other long term (current) drug therapy: Secondary | ICD-10-CM | POA: Insufficient documentation

## 2021-01-11 DIAGNOSIS — M6281 Muscle weakness (generalized): Secondary | ICD-10-CM | POA: Insufficient documentation

## 2021-01-11 DIAGNOSIS — N183 Chronic kidney disease, stage 3 unspecified: Secondary | ICD-10-CM | POA: Diagnosis not present

## 2021-01-11 DIAGNOSIS — Z950 Presence of cardiac pacemaker: Secondary | ICD-10-CM | POA: Insufficient documentation

## 2021-01-11 DIAGNOSIS — Z8546 Personal history of malignant neoplasm of prostate: Secondary | ICD-10-CM | POA: Insufficient documentation

## 2021-01-11 DIAGNOSIS — I639 Cerebral infarction, unspecified: Secondary | ICD-10-CM | POA: Diagnosis present

## 2021-01-11 DIAGNOSIS — U071 COVID-19: Secondary | ICD-10-CM | POA: Diagnosis not present

## 2021-01-11 DIAGNOSIS — E1122 Type 2 diabetes mellitus with diabetic chronic kidney disease: Secondary | ICD-10-CM | POA: Diagnosis not present

## 2021-01-11 DIAGNOSIS — R2990 Unspecified symptoms and signs involving the nervous system: Secondary | ICD-10-CM

## 2021-01-11 DIAGNOSIS — Z794 Long term (current) use of insulin: Secondary | ICD-10-CM | POA: Insufficient documentation

## 2021-01-11 DIAGNOSIS — Z85038 Personal history of other malignant neoplasm of large intestine: Secondary | ICD-10-CM | POA: Diagnosis not present

## 2021-01-11 DIAGNOSIS — I129 Hypertensive chronic kidney disease with stage 1 through stage 4 chronic kidney disease, or unspecified chronic kidney disease: Secondary | ICD-10-CM | POA: Insufficient documentation

## 2021-01-11 HISTORY — DX: Cerebral infarction, unspecified: I63.9

## 2021-01-11 LAB — CBC
HCT: 27.6 % — ABNORMAL LOW (ref 39.0–52.0)
Hemoglobin: 9.4 g/dL — ABNORMAL LOW (ref 13.0–17.0)
MCH: 26.6 pg (ref 26.0–34.0)
MCHC: 34.1 g/dL (ref 30.0–36.0)
MCV: 78.2 fL — ABNORMAL LOW (ref 80.0–100.0)
Platelets: 276 10*3/uL (ref 150–400)
RBC: 3.53 MIL/uL — ABNORMAL LOW (ref 4.22–5.81)
RDW: 16.8 % — ABNORMAL HIGH (ref 11.5–15.5)
WBC: 5.8 10*3/uL (ref 4.0–10.5)
nRBC: 0 % (ref 0.0–0.2)

## 2021-01-11 LAB — RESP PANEL BY RT-PCR (FLU A&B, COVID) ARPGX2
Influenza A by PCR: NEGATIVE
Influenza B by PCR: NEGATIVE
SARS Coronavirus 2 by RT PCR: POSITIVE — AB

## 2021-01-11 LAB — COMPREHENSIVE METABOLIC PANEL
ALT: 10 U/L (ref 0–44)
AST: 15 U/L (ref 15–41)
Albumin: 4.2 g/dL (ref 3.5–5.0)
Alkaline Phosphatase: 35 U/L — ABNORMAL LOW (ref 38–126)
Anion gap: 9 (ref 5–15)
BUN: 30 mg/dL — ABNORMAL HIGH (ref 8–23)
CO2: 28 mmol/L (ref 22–32)
Calcium: 9.7 mg/dL (ref 8.9–10.3)
Chloride: 103 mmol/L (ref 98–111)
Creatinine, Ser: 2.37 mg/dL — ABNORMAL HIGH (ref 0.61–1.24)
GFR, Estimated: 27 mL/min — ABNORMAL LOW (ref >60)
Glucose, Bld: 127 mg/dL — ABNORMAL HIGH (ref 70–99)
Potassium: 3.8 mmol/L (ref 3.5–5.1)
Sodium: 140 mmol/L (ref 135–145)
Total Bilirubin: 0.4 mg/dL (ref 0.3–1.2)
Total Protein: 7.6 g/dL (ref 6.5–8.1)

## 2021-01-11 LAB — DIFFERENTIAL
Abs Immature Granulocytes: 0.02 10*3/uL (ref 0.00–0.07)
Basophils Absolute: 0 10*3/uL (ref 0.0–0.1)
Basophils Relative: 0 %
Eosinophils Absolute: 0.1 10*3/uL (ref 0.0–0.5)
Eosinophils Relative: 2 %
Immature Granulocytes: 0 %
Lymphocytes Relative: 31 %
Lymphs Abs: 1.8 10*3/uL (ref 0.7–4.0)
Monocytes Absolute: 0.4 10*3/uL (ref 0.1–1.0)
Monocytes Relative: 8 %
Neutro Abs: 3.4 10*3/uL (ref 1.7–7.7)
Neutrophils Relative %: 59 %

## 2021-01-11 LAB — PROTIME-INR
INR: 1.1 (ref 0.8–1.2)
Prothrombin Time: 13.7 seconds (ref 11.4–15.2)

## 2021-01-11 LAB — CBG MONITORING, ED
Glucose-Capillary: 129 mg/dL — ABNORMAL HIGH (ref 70–99)
Glucose-Capillary: 51 mg/dL — ABNORMAL LOW (ref 70–99)

## 2021-01-11 LAB — APTT: aPTT: 32 seconds (ref 24–36)

## 2021-01-11 NOTE — ED Provider Notes (Signed)
[facility] Provider Note  CSN: [id number] Arrival date & time: 01/11/21 1431    History Chief Complaint  Patient presents with   Cerebrovascular Accident    Gennie Yun is a 85 y.o. male with history of CKD, DM, HTN and pacemaker reports 7 days ago he had a visit with his eye doctor during which his eyes were dilated. Later that evening he felt dizzy with standing and noticed some weakness of his L arm/leg. He reports he feels off balance when he walks. He states he has had 2 prior strokes affecting his left side but he had recovered back to baseline quickly after both and did not have any residual deficits. He thought the same thing was happening again but after 7 days of persistent symptoms he decided to come to the ED for evaluation.    Past Medical History:  Diagnosis Date   Chronic kidney disease (CKD), stage III (moderate) (HCC)    Colon cancer ([place])    Diabetes mellitus without complication ([place])    Hypertension    Presence of permanent cardiac pacemaker 08/21/2019   Prostate cancer [facility])     Past Surgical History:  Procedure Laterality Date   CARPAL TUNNEL RELEASE     COLON SURGERY     INSERT / REPLACE / REMOVE PACEMAKER  08/21/2019   LAPAROSCOPIC LYSIS OF ADHESIONS N/A 08/08/2018   Procedure: LAPAROSCOPIC LYSIS OF ADHESIONS;  Surgeon: Kieth Brightly Arta Bruce, MD;  Location: [place];  Service: General;  Laterality: N/A;   LAPAROTOMY N/A 08/08/2018   Procedure: Exploratory Laparotomy;  Surgeon: Mickeal Skinner, MD;  Location: [place];  Service: General;  Laterality: N/A;   LYSIS OF ADHESION N/A 08/08/2018   Procedure: Lysis of adhesions with repair of small intestine;  Surgeon: Kieth Brightly Arta Bruce, MD;  Location: [place];  Service: General;  Laterality: N/A;   PACEMAKER IMPLANT N/A 08/21/2019   Procedure: PACEMAKER IMPLANT;  Surgeon: Constance Haw, MD;  Location: [place] CV LAB;  Service: Cardiovascular;   Laterality: N/A;    Family History  Problem Relation Age of Onset   Diabetes Mellitus II Brother    Colon cancer Neg Hx     Social History   Tobacco Use   Smoking status: Never   Smokeless tobacco: Never  Vaping Use   Vaping Use: Never used  Substance Use Topics   Alcohol use: Not Currently   Drug use: Never     Home Medications Prior to Admission medications   Medication Sig Start Date End Date Taking? Authorizing Provider  albuterol (PROAIR HFA) 108 (90 Base) MCG/ACT inhaler Inhale 2 puffs into the lungs every 6 (six) hours as needed for wheezing or shortness of breath.    [provider]  atorvastatin (LIPITOR) 80 MG tablet Take 40 mg by mouth at bedtime.     [provider]  calcium citrate (CALCITRATE - DOSED IN MG ELEMENTAL CALCIUM) 950 (200 Ca) MG tablet Take 400 mg of elemental calcium by mouth 2 (two) times daily.    [provider]  Carboxymethylcellulose Sod PF (REFRESH PLUS) 0.5 % SOLN Place 1 drop into both eyes in the morning, at noon, and at bedtime.     [provider]  Cholecalciferol (VITAMIN D-3) 25 MCG (1000 UT) CAPS Take 1,000 Units by mouth daily.    [provider]  clopidogrel (PLAVIX) 75 MG tablet Take 1 tablet (75 mg total) by mouth daily. 06/02/19   [name], MD  ferrous  sulfate 325 (65 FE) MG tablet Take 325 mg by mouth every Monday, Wednesday, and Friday.    [provider]  fluticasone (FLONASE) 50 MCG/ACT nasal spray Place 1 spray into both nostrils daily as needed for allergies or rhinitis.    [provider]  glucose 4 GM chewable tablet Chew 1 tablet by mouth as needed for low blood sugar.    [provider]  insulin NPH-regular Human (NOVOLIN 70/30) (70-30) 100 UNIT/ML injection Inject 10-70 Units into the skin See admin instructions. Inject 70 units into the skin in the morning and 25-26 units in the evening    [provider]   lisinopril-hydrochlorothiazide (ZESTORETIC) 20-12.5 MG tablet Take 1 tablet by mouth daily.    [provider]  mometasone [facility]) 220 MCG/INH inhaler Inhale 2 puffs into the lungs at bedtime.    [provider]  polyethylene glycol (MIRALAX / GLYCOLAX) 17 g packet Take 17 g by mouth daily as needed for mild constipation or moderate constipation.  02/16/17   [provider]  PRESCRIPTION MEDICATION CPAP- At bedtime    [provider]  primidone (MYSOLINE) 50 MG tablet Take 100-150 mg by mouth See admin instructions. Take 100 mg by mouth in the morning and 150 mg at bedtime    [provider]     Allergies    Metformin and related and Penicillins   Review of Systems   Review of Systems A comprehensive review of systems was completed and negative except as noted in HPI.    Physical Exam BP (!) 169/75    Pulse 74    Temp 98.4 F (36.9 C) (Oral)    Resp 19    Ht 5\' 5"  (1.651 m)    Wt 126.1 kg    SpO2 99%    BMI 46.26 kg/m   Physical Exam Vitals and nursing note reviewed.  Constitutional:      Appearance: Normal appearance.  HENT:     Head: Normocephalic and atraumatic.     Nose: Nose normal.     Mouth/Throat:     Mouth: Mucous membranes are moist.  Eyes:     Extraocular Movements: Extraocular movements intact.     Conjunctiva/sclera: Conjunctivae normal.  Cardiovascular:     Rate and Rhythm: Normal rate.  Pulmonary:     Effort: Pulmonary effort is normal.     Breath sounds: Normal breath sounds.  Abdominal:     General: Abdomen is flat.     Palpations: Abdomen is soft.     Tenderness: There is no abdominal tenderness.  Musculoskeletal:        General: No swelling. Normal range of motion.     Cervical back: Neck supple.  Skin:    General: Skin is warm and dry.  Neurological:     General: No focal deficit present.     Mental Status: He is alert and oriented to person, place, and time.     Cranial Nerves: No cranial nerve  deficit.     Sensory: No sensory deficit.     Motor: Weakness (L arm and leg 4/5 strength, R arm/leg 5/5 strength) present.     Gait: Gait abnormal (walks with side based gait, mildly unstable with narrow based gait).  Psychiatric:        Mood and Affect: Mood normal.     ED Results / Procedures / Treatments   Labs (all labs ordered are listed, but only abnormal results are displayed) Labs Reviewed  RESP PANEL  BY RT-PCR (FLU A&B, COVID) ARPGX2 - Abnormal; Notable for the following components:      Result Value   SARS Coronavirus 2 by RT PCR POSITIVE (*)    All other components within normal limits  CBC - Abnormal; Notable for the following components:   RBC 3.53 (*)    Hemoglobin 9.4 (*)    HCT 27.6 (*)    MCV 78.2 (*)    RDW 16.8 (*)    All other components within normal limits  COMPREHENSIVE METABOLIC PANEL - Abnormal; Notable for the following components:   Glucose, Bld 127 (*)    BUN 30 (*)    Creatinine, Ser 2.37 (*)    Alkaline Phosphatase 35 (*)    GFR, Estimated 27 (*)    All other components within normal limits  CBG MONITORING, ED - Abnormal; Notable for the following components:   Glucose-Capillary 129 (*)    All other components within normal limits  PROTIME-INR  APTT  DIFFERENTIAL    EKG EKG Interpretation  Date/Time:  Sunday January 11 2021 15:00:00 EST Ventricular Rate:  86 PR Interval:  222 QRS Duration: 164 QT Interval:  438 QTC Calculation: 524 R Axis:   -76 Text Interpretation: A-sensed V paced Abnormal ekg No significant change since last tracing Confirmed by Calvert Cantor [id number]) on 01/11/2021 3:03:41 PM   Radiology CT HEAD WO CONTRAST  Result Date: 01/11/2021 CLINICAL DATA:  Neuro deficit acute stroke suspected. EXAM: CT HEAD WITHOUT CONTRAST TECHNIQUE: Contiguous axial images were obtained from the base of the skull through the vertex without intravenous contrast. COMPARISON:  CT head dated May 30, 2019 FINDINGS: Brain: No evidence of  acute infarction, hemorrhage, hydrocephalus, extra-axial collection or mass lesion/mass effect. Mild cerebral atrophy and chronic microvascular ischemic changes of the white matter are unchanged. Vascular: No hyperdense vessel or unexpected calcification. Skull: Normal. Negative for fracture or focal lesion. Sinuses/Orbits: Patchy opacification of the ethmoid air cells as well as complete opacification of the left sphenoid sinus. Other: None. IMPRESSION: 1.  No acute intracranial abnormality. 2. Mild cerebral volume loss and chronic microvascular ischemic changes of the white matter are unchanged. 3.  Mild paranasal sinus disease. Electronically Signed   By: Keane Police D.O.   On: 01/11/2021 15:38    Procedures Procedures  Medications Ordered in the ED Medications - No data to display   MDM Rules/Calculators/A&P MDM Patient with symptoms concerning for subacute stroke. Will check labs and send for CT.   ED Course  I have reviewed the triage vital signs and the nursing notes.  Pertinent labs & imaging results that were available during my care of the patient were reviewed by me and considered in my medical decision making (see chart for details).  Clinical Course as of 01/11/21 2136  Nancy Fetter Jan 11, 2021  1526 Coags are normal.  [CS]  6606 CBC with moderate anemia at baseline.  [CS]  3016 CT images and results reviewed, no acute process. [CS]  54 CMP shows CKD, mild worsening from previous 35mo ago, no more recent available for comparison. Will discuss with Neurology on call.  [CS]  1743 Spoke with Dr. Rory Percy, Neurology, who recommends the patient be sent to the [facility] ED for an MRI and if negative can be discharged with outpatient Neurology follow up. If positive will be admitted for stroke workup. Spoke with Dr. Johnney Killian, EDP at [facility] who is aware.  [CS]  0109 Patient has a pacemaker, will not be able to get an MRI  tonight. I offered admission for further management but the patient is reluctant.  He is discussing options with his wife.  [CS]  6226 Patient will agree to admission for MRI while the pacemaker team is available tomorrow.  [CS]  3335 Spoke with Dr. Roosevelt Locks, Hospitalist, who will accept for admission.  [CS]  2130 Patient now would like to go home. He would like to try to get an MRI done as an outpatient. I explained that it might be several days or longer for an outpatient MRI to be scheduled especially given the pacemaker and he and his wife understand. He was advised to RTED, preferably [place] if his symptoms worsen or if he has any other concerns. He is aware he has a positive Covid test, not currently having any respiratory symptoms but he did have a cough about a month ago.  [CS]  2136 Patient with low glucose just prior to discharge, will give orange juice and crackers prior to leaving.  [CS]    Clinical Course User Index [CS] Truddie Hidden, MD    Final Clinical Impression(s) / ED Diagnoses Final diagnoses:  Stroke-like symptoms    Rx / DC Orders ED Discharge Orders          Ordered    MR BRAIN WO CONTRAST        01/11/21 2132             Truddie Hidden, MD 01/11/21 2134

## 2021-01-11 NOTE — ED Notes (Signed)
Pt. Took home medication Plavix and aspirin.

## 2021-01-11 NOTE — Plan of Care (Signed)
ON CALL PHONE CONSULT  Call from Dr. Sheldon@5 :38 PM   Discussion: 1 week worth of dizziness, possible left-sided weakness, wide-based gait.  CT head negative.  Intermittent headache.  I recommended given his prior history of TIAs and the current exam with symptoms ongoing for a week, do an ER to ER transfer for an MRI brain to rule out an acute stroke especially a posterior circulation stroke.  If MRI is negative, he is an established patient of [facility] neurology and can follow-up outpatient in the next week. Continue on current antiepileptic treatment  -- [address], MD Neurologist [facility] Pager: [phone]

## 2021-01-11 NOTE — ED Notes (Signed)
Pt was given cheese crackers and orange juice as per RN. MD aware.

## 2021-01-11 NOTE — ED Triage Notes (Addendum)
Pt states on 12/12 in the evening he had sudden onset dizziness and L side weakness. Pt thought it may be due to having his eyes dilated at an eye exam. Pt here today because he never got better. Pt noted to have some arm and leg drift on the L. Pt also reports intermittent HA since symptoms started.

## 2021-02-05 ENCOUNTER — Telehealth: Payer: Self-pay | Admitting: Interventional Cardiology

## 2021-02-05 NOTE — Telephone Encounter (Signed)
Spoke with patients wife informed her that I had sent back documentation to [facility] them that we had not seen Mr. Daniel Odom since 08/2019 and that our records indicate his PPM was being followed by the [facility] in [place] she stated she told them the same thing she stated she would call them back.

## 2021-02-05 NOTE — Telephone Encounter (Signed)
°  Pts wife is reaching out to see if PT is cleared to have MRI done with him having a pacemaker.. requested clearance on 01/30/21 and hasnt heard back... [facility] radiologist department has approved just waiting on Dr. Tamala Julian to approve pleae advise.

## 2021-04-09 ENCOUNTER — Ambulatory Visit (HOSPITAL_COMMUNITY): Payer: No Typology Code available for payment source

## 2021-04-12 ENCOUNTER — Emergency Department (HOSPITAL_COMMUNITY): Payer: No Typology Code available for payment source

## 2021-04-12 ENCOUNTER — Emergency Department (HOSPITAL_COMMUNITY)
Admission: EM | Admit: 2021-04-12 | Discharge: 2021-04-13 | Disposition: A | Discharge status: Home / Self Care | Payer: No Typology Code available for payment source | Attending: Student | Admitting: Student

## 2021-04-12 ENCOUNTER — Encounter (HOSPITAL_COMMUNITY): Payer: Self-pay

## 2021-04-12 ENCOUNTER — Other Ambulatory Visit: Payer: Self-pay

## 2021-04-12 DIAGNOSIS — Z794 Long term (current) use of insulin: Secondary | ICD-10-CM | POA: Insufficient documentation

## 2021-04-12 DIAGNOSIS — Z8546 Personal history of malignant neoplasm of prostate: Secondary | ICD-10-CM | POA: Diagnosis not present

## 2021-04-12 DIAGNOSIS — I10 Essential (primary) hypertension: Secondary | ICD-10-CM | POA: Diagnosis present

## 2021-04-12 DIAGNOSIS — Z85038 Personal history of other malignant neoplasm of large intestine: Secondary | ICD-10-CM | POA: Insufficient documentation

## 2021-04-12 DIAGNOSIS — E1169 Type 2 diabetes mellitus with other specified complication: Secondary | ICD-10-CM | POA: Diagnosis present

## 2021-04-12 DIAGNOSIS — E1122 Type 2 diabetes mellitus with diabetic chronic kidney disease: Secondary | ICD-10-CM | POA: Insufficient documentation

## 2021-04-12 DIAGNOSIS — H81399 Other peripheral vertigo, unspecified ear: Secondary | ICD-10-CM

## 2021-04-12 DIAGNOSIS — D649 Anemia, unspecified: Secondary | ICD-10-CM

## 2021-04-12 DIAGNOSIS — J45909 Unspecified asthma, uncomplicated: Secondary | ICD-10-CM | POA: Diagnosis not present

## 2021-04-12 DIAGNOSIS — R778 Other specified abnormalities of plasma proteins: Secondary | ICD-10-CM

## 2021-04-12 DIAGNOSIS — R42 Dizziness and giddiness: Secondary | ICD-10-CM | POA: Diagnosis present

## 2021-04-12 DIAGNOSIS — N1832 Chronic kidney disease, stage 3b: Secondary | ICD-10-CM | POA: Diagnosis present

## 2021-04-12 DIAGNOSIS — I129 Hypertensive chronic kidney disease with stage 1 through stage 4 chronic kidney disease, or unspecified chronic kidney disease: Secondary | ICD-10-CM | POA: Insufficient documentation

## 2021-04-12 DIAGNOSIS — R5383 Other fatigue: Secondary | ICD-10-CM | POA: Diagnosis not present

## 2021-04-12 DIAGNOSIS — Z79899 Other long term (current) drug therapy: Secondary | ICD-10-CM | POA: Insufficient documentation

## 2021-04-12 DIAGNOSIS — E669 Obesity, unspecified: Secondary | ICD-10-CM | POA: Diagnosis present

## 2021-04-12 DIAGNOSIS — R519 Headache, unspecified: Secondary | ICD-10-CM | POA: Insufficient documentation

## 2021-04-12 DIAGNOSIS — R112 Nausea with vomiting, unspecified: Secondary | ICD-10-CM | POA: Insufficient documentation

## 2021-04-12 DIAGNOSIS — Z950 Presence of cardiac pacemaker: Secondary | ICD-10-CM | POA: Diagnosis not present

## 2021-04-12 DIAGNOSIS — Z8673 Personal history of transient ischemic attack (TIA), and cerebral infarction without residual deficits: Secondary | ICD-10-CM

## 2021-04-12 DIAGNOSIS — N183 Chronic kidney disease, stage 3 unspecified: Secondary | ICD-10-CM | POA: Diagnosis not present

## 2021-04-12 DIAGNOSIS — R7989 Other specified abnormal findings of blood chemistry: Secondary | ICD-10-CM

## 2021-04-12 DIAGNOSIS — N289 Disorder of kidney and ureter, unspecified: Secondary | ICD-10-CM

## 2021-04-12 DIAGNOSIS — R748 Abnormal levels of other serum enzymes: Secondary | ICD-10-CM

## 2021-04-12 LAB — CBC WITH DIFFERENTIAL/PLATELET
Abs Immature Granulocytes: 0.01 10*3/uL (ref 0.00–0.07)
Basophils Absolute: 0 10*3/uL (ref 0.0–0.1)
Basophils Relative: 1 %
Eosinophils Absolute: 0.2 10*3/uL (ref 0.0–0.5)
Eosinophils Relative: 3 %
HCT: 27.8 % — ABNORMAL LOW (ref 39.0–52.0)
Hemoglobin: 9.5 g/dL — ABNORMAL LOW (ref 13.0–17.0)
Immature Granulocytes: 0 %
Lymphocytes Relative: 20 %
Lymphs Abs: 1.1 10*3/uL (ref 0.7–4.0)
MCH: 27.3 pg (ref 26.0–34.0)
MCHC: 34.2 g/dL (ref 30.0–36.0)
MCV: 79.9 fL — ABNORMAL LOW (ref 80.0–100.0)
Monocytes Absolute: 0.4 10*3/uL (ref 0.1–1.0)
Monocytes Relative: 7 %
Neutro Abs: 3.8 10*3/uL (ref 1.7–7.7)
Neutrophils Relative %: 69 %
Platelets: 230 10*3/uL (ref 150–400)
RBC: 3.48 MIL/uL — ABNORMAL LOW (ref 4.22–5.81)
RDW: 16.4 % — ABNORMAL HIGH (ref 11.5–15.5)
WBC: 5.5 10*3/uL (ref 4.0–10.5)
nRBC: 0 % (ref 0.0–0.2)

## 2021-04-12 MED ORDER — MECLIZINE HCL 25 MG PO TABS: 25.0000 mg | ORAL_TABLET | Freq: Once | ORAL | Status: DC

## 2021-04-12 MED ORDER — LACTATED RINGERS IV BOLUS
1000.0000 mL | Freq: Once | INTRAVENOUS | Status: AC
  Administered 2021-04-12: 1000 mL via INTRAVENOUS

## 2021-04-12 MED ORDER — PROCHLORPERAZINE EDISYLATE 10 MG/2ML IJ SOLN
10.0000 mg | Freq: Once | INTRAMUSCULAR | Status: AC
  Administered 2021-04-12: 10 mg via INTRAVENOUS
  Filled 2021-04-12: qty 2

## 2021-04-12 MED ORDER — DIPHENHYDRAMINE HCL 50 MG/ML IJ SOLN
25.0000 mg | Freq: Once | INTRAMUSCULAR | Status: AC
  Administered 2021-04-12: 25 mg via INTRAVENOUS
  Filled 2021-04-12: qty 1

## 2021-04-12 NOTE — ED Provider Notes (Signed)
?[facility] ?Provider Note ? ?CSN: [id number] ?Arrival date & time: 04/12/21 2122 ? ?Chief Complaint(s) ?Dizziness and Headache ? ?HPI ?Daniel Odom is a 86 y.o. male with PMH CKD 3, colon cancer, CVA, permanent pacemaker placement who presents emergency department for evaluation of dizziness and headache.  Patient states that symptoms began abruptly at 3 PM this afternoon with associated vertigo, nausea vomiting and fatigue.  He states that his symptoms worsened with rapid head movements, are fatigable and stop when he is not moving his head.  He endorses nausea but denies vomiting.  Denies chest pain, shortness of breath, fever or other systemic symptoms.  Denies numbness, tingling, weakness or other neurologic complaints ? ? ?Dizziness ?Associated symptoms: headaches   ?Headache ?Associated symptoms: dizziness   ? ?Past Medical History ?Past Medical History:  ?Diagnosis Date  ? Chronic kidney disease (CKD), stage III (moderate) (HCC)   ? Colon cancer ([place])   ? Diabetes mellitus without complication ([place])   ? Hypertension   ? Presence of permanent cardiac pacemaker 08/21/2019  ? Prostate cancer ([place])   ? ?Patient Active Problem List  ? Diagnosis Date Noted  ? 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 Syncope 08/21/2019  ? TIA (transient ischemic attack) 06/01/2019  ? Left-sided weakness 05/30/2019  ? Mobitz type 2 second degree AV block 08/05/2018  ? Morbid obesity with body mass index of 40.0-49.9 ([place]) 08/05/2018  ? Hypertension   ? Diabetes mellitus without complication ([place])   ? SBO (small bowel obstruction) ([place]) 08/03/2018  ? History of colon cancer 08/03/2018  ? Diabetes mellitus type 2 in obese ([place]) 08/03/2018  ? Essential hypertension 08/03/2018  ? Anemia associated with chronic renal failure 08/03/2018  ? CKD (chronic kidney disease) stage 3, GFR 30-59 ml/min (HCC) 02/07/2017  ? OSA on CPAP 02/07/2017  ? Tremor, hereditary, benign 02/07/2017  ? Asthma 02/07/2017  ? BPH  with obstruction/lower urinary tract symptoms 12/26/2016  ? Prostate cancer ([place]) 08/08/2016  ? ?Home Medication(s) ?Prior to Admission medications   ?Medication Sig Start Date End Date Taking? Authorizing Provider  ?albuterol (PROAIR HFA) 108 (90 Base) MCG/ACT inhaler Inhale 2 puffs into the lungs every 6 (six) hours as needed for wheezing or shortness of breath.    [provider]  ?atorvastatin (LIPITOR) 80 MG tablet Take 40 mg by mouth at bedtime.     [provider]  ?calcium citrate (CALCITRATE - DOSED IN MG ELEMENTAL CALCIUM) 950 (200 Ca) MG tablet Take 400 mg of elemental calcium by mouth 2 (two) times daily.    [provider]  ?Carboxymethylcellulose Sod PF (REFRESH PLUS) 0.5 % SOLN Place 1 drop into both eyes in the morning, at noon, and at bedtime.     [provider]  ?Cholecalciferol (VITAMIN D-3) 25 MCG (1000 UT) CAPS Take 1,000 Units by mouth daily.    [provider]  ?clopidogrel (PLAVIX) 75 MG tablet Take 1 tablet (75 mg total) by mouth daily. 06/02/19   [name], MD  ?ferrous sulfate 325 (65 FE) MG tablet Take 325 mg by mouth every Monday, Wednesday, and Friday.    [provider]  ?fluticasone (FLONASE) 50 MCG/ACT nasal spray Place 1 spray into both nostrils daily as needed for allergies or rhinitis.    [provider]  ?glucose 4 GM chewable tablet Chew 1 tablet by mouth as needed for low blood sugar.    [provider]  ?insulin NPH-regular Human (NOVOLIN 70/30) (70-30) 100 UNIT/ML injection  Inject 10-70 Units into the skin See admin instructions. Inject 70 units into the skin in the morning and 25-26 units in the evening    [provider]  ?lisinopril-hydrochlorothiazide (ZESTORETIC) 20-12.5 MG tablet Take 1 tablet by mouth daily.    [provider]  ?mometasone (ASMANEX) 220 MCG/INH inhaler Inhale 2 puffs into the lungs at bedtime.    [provider]  ?polyethylene glycol (MIRALAX /  GLYCOLAX) 17 g packet Take 17 g by mouth daily as needed for mild constipation or moderate constipation.  02/16/17   [provider]  ?PRESCRIPTION MEDICATION CPAP- At bedtime    [provider]  ?primidone (MYSOLINE) 50 MG tablet Take 100-150 mg by mouth See admin instructions. Take 100 mg by mouth in the morning and 150 mg at bedtime    [provider]  ?                                                                                                                                  ?Past Surgical History ?Past Surgical History:  ?Procedure Laterality Date  ? CARPAL TUNNEL RELEASE    ? COLON SURGERY    ? INSERT / REPLACE / REMOVE PACEMAKER  08/21/2019  ? LAPAROSCOPIC LYSIS OF ADHESIONS N/A 08/08/2018  ? Procedure: LAPAROSCOPIC LYSIS OF ADHESIONS;  Surgeon: Kinsinger, Arta Bruce, MD;  Location: [place];  Service: General;  Laterality: N/A;  ? LAPAROTOMY N/A 08/08/2018  ? Procedure: Exploratory Laparotomy;  Surgeon: Kinsinger, Arta Bruce, MD;  Location: [place];  Service: General;  Laterality: N/A;  ? LYSIS OF ADHESION N/A 08/08/2018  ? Procedure: Lysis of adhesions with repair of small intestine;  Surgeon: Kinsinger, Arta Bruce, MD;  Location: [place];  Service: General;  Laterality: N/A;  ? PACEMAKER IMPLANT N/A 08/21/2019  ? Procedure: PACEMAKER IMPLANT;  Surgeon: Constance Haw, MD;  Location: [place] CV LAB;  Service: Cardiovascular;  Laterality: N/A;  ? ?Family History ?Family History  ?Problem Relation Age of Onset  ? Diabetes Mellitus II Brother   ? Colon cancer Neg Hx   ? ? ?Social History ?Social History  ? ?Tobacco Use  ? Smoking status: Never  ? Smokeless tobacco: Never  ?Vaping Use  ? Vaping Use: Never used  ?Substance Use Topics  ? Alcohol use: Not Currently  ? Drug use: Never  ? ?Allergies ?Metformin and related and Penicillins ? ?Review of Systems ?Review of Systems  ?Neurological:  Positive for dizziness and headaches.  ? ?Physical Exam ?Vital Signs  ?I have reviewed the  triage vital signs ?BP (!) 146/75 (BP Location: Right Arm)   Pulse 67   Temp (!) 97.4 ?F (36.3 ?C) (Oral)   Resp 13   Ht '5\' 5"'$  (1.651 m)   Wt 126.1 kg   SpO2 99%   BMI 46.26 kg/m?  ? ?Physical Exam ?Vitals and nursing note reviewed.  ?Constitutional:   ?   General: He  is not in acute distress. ?   Appearance: He is well-developed.  ?HENT:  ?   Head: Normocephalic and atraumatic.  ?Eyes:  ?   Conjunctiva/sclera: Conjunctivae normal.  ?Cardiovascular:  ?   Rate and Rhythm: Normal rate and regular rhythm.  ?   Heart sounds: No murmur heard. ?Pulmonary:  ?   Effort: Pulmonary effort is normal. No respiratory distress.  ?   Breath sounds: Normal breath sounds.  ?Abdominal:  ?   Palpations: Abdomen is soft.  ?   Tenderness: There is no abdominal tenderness.  ?Musculoskeletal:     ?   General: No swelling.  ?   Cervical back: Neck supple.  ?Skin: ?   General: Skin is warm and dry.  ?   Capillary Refill: Capillary refill takes less than 2 seconds.  ?Neurological:  ?   Mental Status: He is alert.  ?   Cranial Nerves: No cranial nerve deficit.  ?   Sensory: No sensory deficit.  ?   Motor: No weakness.  ?Psychiatric:     ?   Mood and Affect: Mood normal.  ? ? ?ED Results and Treatments ?Labs ?(all labs ordered are listed, but only abnormal results are displayed) ?Labs Reviewed  ?COMPREHENSIVE METABOLIC PANEL  ?CBC WITH DIFFERENTIAL/PLATELET  ?URINALYSIS, ROUTINE W REFLEX MICROSCOPIC  ?LIPASE, BLOOD  ?TROPONIN I (HIGH SENSITIVITY)  ?                                                                                                                       ? ?Radiology ?No results found. ? ?Pertinent labs & imaging results that were available during my care of the patient were reviewed by me and considered in my medical decision making (see MDM for details). ? ?Medications Ordered in ED ?Medications  ?lactated ringers bolus 1,000 mL (has no administration in time range)  ?meclizine (ANTIVERT) tablet 25 mg (has no administration  in time range)  ?                                                               ?                                                                    ?Procedures ?Procedures ? ?(including critical care time

## 2021-04-12 NOTE — ED Provider Notes (Addendum)
Care assumed from Dr. Matilde Sprang, patient with vertigo and headache.  Vertigo is improved, headache cocktail is being administered and screening labs are pending. ? ?Headache is improved following headache cocktail.  Labs show stable anemia, stable renal insufficiency.  Troponin has come back slightly elevated at 36.  It is noted that he did have a slightly elevated troponin of 21 on 08/21/2019.  He was held in the emergency department and troponin repeated, has risen slightly to 42.  Significance of this is uncertain.  Patient continues to deny chest pain, heaviness, tightness, pressure.  At this point, I do not feel there is an indication for anticoagulation, but he is given a dose of aspirin.  We will need to check ECG.   ? ?ECG shows atrial sensed, ventricular paced rhythm, unchanged from prior although PVCs are now noted.  Patient will need to be admitted for further evaluation.  Case is discussed with Dr. Bridgett Larsson of [facility], who agrees to admit the patient. ? ?Results for orders placed or performed during the hospital encounter of 04/12/21  ?Comprehensive metabolic panel  ?Result Value Ref Range  ? Sodium 139 135 - 145 mmol/L  ? Potassium 4.4 3.5 - 5.1 mmol/L  ? Chloride 105 98 - 111 mmol/L  ? CO2 22 22 - 32 mmol/L  ? Glucose, Bld 68 (L) 70 - 99 mg/dL  ? BUN 24 (H) 8 - 23 mg/dL  ? Creatinine, Ser 2.09 (H) 0.61 - 1.24 mg/dL  ? Calcium 9.3 8.9 - 10.3 mg/dL  ? Total Protein 7.3 6.5 - 8.1 g/dL  ? Albumin 4.1 3.5 - 5.0 g/dL  ? AST 25 15 - 41 U/L  ? ALT 15 0 - 44 U/L  ? Alkaline Phosphatase 38 38 - 126 U/L  ? Total Bilirubin 0.8 0.3 - 1.2 mg/dL  ? GFR, Estimated 31 (L) >60 mL/min  ? Anion gap 12 5 - 15  ?CBC with Differential  ?Result Value Ref Range  ? WBC 5.5 4.0 - 10.5 K/uL  ? RBC 3.48 (L) 4.22 - 5.81 MIL/uL  ? Hemoglobin 9.5 (L) 13.0 - 17.0 g/dL  ? HCT 27.8 (L) 39.0 - 52.0 %  ? MCV 79.9 (L) 80.0 - 100.0 fL  ? MCH 27.3 26.0 - 34.0 pg  ? MCHC 34.2 30.0 - 36.0 g/dL  ? RDW 16.4 (H) 11.5 - 15.5 %  ? Platelets 230  150 - 400 K/uL  ? nRBC 0.0 0.0 - 0.2 %  ? Neutrophils Relative % 69 %  ? Neutro Abs 3.8 1.7 - 7.7 K/uL  ? Lymphocytes Relative 20 %  ? Lymphs Abs 1.1 0.7 - 4.0 K/uL  ? Monocytes Relative 7 %  ? Monocytes Absolute 0.4 0.1 - 1.0 K/uL  ? Eosinophils Relative 3 %  ? Eosinophils Absolute 0.2 0.0 - 0.5 K/uL  ? Basophils Relative 1 %  ? Basophils Absolute 0.0 0.0 - 0.1 K/uL  ? Immature Granulocytes 0 %  ? Abs Immature Granulocytes 0.01 0.00 - 0.07 K/uL  ?Urinalysis, Routine w reflex microscopic  ?Result Value Ref Range  ? Color, Urine STRAW (A) YELLOW  ? APPearance CLEAR CLEAR  ? Specific Gravity, Urine 1.010 1.005 - 1.030  ? pH 8.0 5.0 - 8.0  ? Glucose, UA NEGATIVE NEGATIVE mg/dL  ? Hgb urine dipstick NEGATIVE NEGATIVE  ? Bilirubin Urine NEGATIVE NEGATIVE  ? Ketones, ur NEGATIVE NEGATIVE mg/dL  ? Protein, ur 30 (A) NEGATIVE mg/dL  ? Nitrite NEGATIVE NEGATIVE  ? Leukocytes,Ua NEGATIVE NEGATIVE  ?  RBC / HPF 0-5 0 - 5 RBC/hpf  ? WBC, UA 0-5 0 - 5 WBC/hpf  ? Bacteria, UA NONE SEEN NONE SEEN  ?Lipase, blood  ?Result Value Ref Range  ? Lipase 47 11 - 51 U/L  ?Troponin I (High Sensitivity)  ?Result Value Ref Range  ? Troponin I (High Sensitivity) 36 (H) <18 ng/L  ?Troponin I (High Sensitivity)  ?Result Value Ref Range  ? Troponin I (High Sensitivity) 42 (H) <18 ng/L  ? ?CT Head Wo Contrast ? ?Result Date: 04/12/2021 ?CLINICAL DATA:  Vertigo, central EXAM: CT HEAD WITHOUT CONTRAST TECHNIQUE: Contiguous axial images were obtained from the base of the skull through the vertex without intravenous contrast. RADIATION DOSE REDUCTION: This exam was performed according to the departmental dose-optimization program which includes automated exposure control, adjustment of the mA and/or kV according to patient size and/or use of iterative reconstruction technique. COMPARISON:  Head CT 01/11/2021 FINDINGS: Brain: Stable degree of atrophy and chronic small vessel ischemia. No intracranial hemorrhage, mass effect, or midline shift. No  hydrocephalus. The basilar cisterns are patent. Chronic bilateral basal gangliar mineralization, senescent. No evidence of territorial infarct or acute ischemia. No extra-axial or intracranial fluid collection. Vascular: Atherosclerosis of skullbase vasculature without hyperdense vessel or abnormal calcification. Skull: No fracture or focal lesion. Sinuses/Orbits: Chronic low sphenoid sinus disease with complete opacification in bony sclerosis/cortical thickening. Partial opacification of posterior left ethmoid air cells unchanged. Occasional mucosal thickening of the frontal sinuses. Partial opacification of left mastoid air cells, with progression from prior exam. Other: None. IMPRESSION: 1. No acute intracranial abnormality. 2. Stable atrophy and chronic small vessel ischemia. 3. Chronic sphenoid sinus disease. Partial opacification of left mastoid air cells, with progression from prior exam. Electronically Signed   By: Keith Rake M.D.   On: 04/12/2021 22:46   ? ? EKG Interpretation ? ?Date/Time:  Monday April 13 2021 04:45:16 EDT ?Ventricular Rate:  60 ?PR Interval:  105 ?QRS Duration: 167 ?QT Interval:  464 ?QTC Calculation: 464 ?R Axis:   -31 ?Text Interpretation: Atrial-sensed ventricular-paced rhythm Premature ventricular complexes When compared with ECG of 01/11/2021, Premature ventricular complexes are now present Confirmed by Delora Fuel ([id number]) on 04/13/2021 5:54:19 AM ?  ? ?  ? ? ?  ?Delora Fuel, MD ?09/47/09 [phone] ? ?Dr. Vallarie Mare has evaluated the patient and requests an additional troponin level be obtained and cardiology consultation be obtained.  He feels that the patient may not need to be admitted.  Additional troponin and cardiology consultation are requested. ?  ?Delora Fuel, MD ?66/29/47 [phone] ? ?

## 2021-04-12 NOTE — ED Triage Notes (Signed)
Pt BIB EMS c/o dizziness and headache. Pt states that his dizziness increases whenever he opens his eyes or moves his head. Pt has x1 episode of emesis with EMS, which he contributes to the dizziness. Pt does have a on demand pacemaker w/ a rate of 72.  ? ?VSS w/ EMS besides a BP of 198/ 90/ ?

## 2021-04-12 NOTE — ED Notes (Signed)
Patient transported to CT 

## 2021-04-13 ENCOUNTER — Encounter (HOSPITAL_COMMUNITY): Payer: Self-pay | Admitting: Internal Medicine

## 2021-04-13 DIAGNOSIS — Z950 Presence of cardiac pacemaker: Secondary | ICD-10-CM

## 2021-04-13 DIAGNOSIS — N1832 Chronic kidney disease, stage 3b: Secondary | ICD-10-CM | POA: Diagnosis not present

## 2021-04-13 DIAGNOSIS — R42 Dizziness and giddiness: Secondary | ICD-10-CM

## 2021-04-13 DIAGNOSIS — I10 Essential (primary) hypertension: Secondary | ICD-10-CM

## 2021-04-13 DIAGNOSIS — R748 Abnormal levels of other serum enzymes: Secondary | ICD-10-CM | POA: Diagnosis not present

## 2021-04-13 DIAGNOSIS — E1169 Type 2 diabetes mellitus with other specified complication: Secondary | ICD-10-CM | POA: Diagnosis not present

## 2021-04-13 DIAGNOSIS — Z8673 Personal history of transient ischemic attack (TIA), and cerebral infarction without residual deficits: Secondary | ICD-10-CM

## 2021-04-13 DIAGNOSIS — E669 Obesity, unspecified: Secondary | ICD-10-CM

## 2021-04-13 LAB — COMPREHENSIVE METABOLIC PANEL
ALT: 15 U/L (ref 0–44)
AST: 25 U/L (ref 15–41)
Albumin: 4.1 g/dL (ref 3.5–5.0)
Alkaline Phosphatase: 38 U/L (ref 38–126)
Anion gap: 12 (ref 5–15)
BUN: 24 mg/dL — ABNORMAL HIGH (ref 8–23)
CO2: 22 mmol/L (ref 22–32)
Calcium: 9.3 mg/dL (ref 8.9–10.3)
Chloride: 105 mmol/L (ref 98–111)
Creatinine, Ser: 2.09 mg/dL — ABNORMAL HIGH (ref 0.61–1.24)
GFR, Estimated: 31 mL/min — ABNORMAL LOW (ref >60)
Glucose, Bld: 68 mg/dL — ABNORMAL LOW (ref 70–99)
Potassium: 4.4 mmol/L (ref 3.5–5.1)
Sodium: 139 mmol/L (ref 135–145)
Total Bilirubin: 0.8 mg/dL (ref 0.3–1.2)
Total Protein: 7.3 g/dL (ref 6.5–8.1)

## 2021-04-13 LAB — URINALYSIS, ROUTINE W REFLEX MICROSCOPIC
Bacteria, UA: NONE SEEN
Bilirubin Urine: NEGATIVE
Glucose, UA: NEGATIVE mg/dL
Hgb urine dipstick: NEGATIVE
Ketones, ur: NEGATIVE mg/dL
Leukocytes,Ua: NEGATIVE
Nitrite: NEGATIVE
Protein, ur: 30 mg/dL — AB
Specific Gravity, Urine: 1.01 (ref 1.005–1.030)
pH: 8 (ref 5.0–8.0)

## 2021-04-13 LAB — CBG MONITORING, ED: Glucose-Capillary: 101 mg/dL — ABNORMAL HIGH (ref 70–99)

## 2021-04-13 LAB — TROPONIN I (HIGH SENSITIVITY)
Troponin I (High Sensitivity): 36 ng/L — ABNORMAL HIGH (ref <18)
Troponin I (High Sensitivity): 42 ng/L — ABNORMAL HIGH (ref <18)
Troponin I (High Sensitivity): 40 ng/L — ABNORMAL HIGH (ref <18)

## 2021-04-13 LAB — LIPASE, BLOOD: Lipase: 47 U/L (ref 11–51)

## 2021-04-13 MED ORDER — INSULIN ASPART 100 UNIT/ML IJ SOLN: 0.0000 [IU] | Freq: Every day | INTRAMUSCULAR | Status: DC

## 2021-04-13 MED ORDER — ASPIRIN 81 MG PO CHEW
324.0000 mg | CHEWABLE_TABLET | Freq: Once | ORAL | Status: AC
Start: 2021-04-13 — End: 2021-04-13
  Administered 2021-04-13: 324 mg via ORAL
  Filled 2021-04-13: qty 4

## 2021-04-13 MED ORDER — INSULIN ASPART 100 UNIT/ML IJ SOLN: 0.0000 [IU] | Freq: Three times a day (TID) | INTRAMUSCULAR | Status: DC

## 2021-04-13 MED ORDER — MECLIZINE HCL 25 MG PO TABS: 12.5000 mg | ORAL_TABLET | Freq: Two times a day (BID) | ORAL | 0 refills | Status: AC | PRN

## 2021-04-13 NOTE — ED Notes (Signed)
Reviewed discharge instructions with patient and spouse. Follow-up care and medications reviewed. Patient and spouse verbalized understanding. Patient A&Ox4, VSS, and ambulatory with steady gait upon discharge.  °

## 2021-04-13 NOTE — Subjective & Objective (Signed)
CC: Dizziness ?HPI: ?86 year old African-American male history of type 2 diabetes, hypertension, CKD stage IIIa, history of Mobitz type II status post pacemaker presents to the ER today with sudden onset of dizziness starting around 6 PM on 04/12/2021.  Patient was somewhat nauseous.  He did not have any other strokelike symptoms aside from dizziness.  He admits to vertiginous type symptoms with room spinning.  He denies any chest pain, shortness of breath.  He was worried that he may have had another stroke and came to the ER for evaluation. ? ?On arrival temp 97.4 heart rate 67 blood pressure 146/75 satting 99% on room air. ? ?Laboratory evaluation: ?Urinalysis negative ? ?Chemistry sodium 139 BUN of 24 creatinine 2.0 glucose of 68 ? ?Lipase normal at 47 ?White count 5.5, hemoglobin 9.5, platelets 230 ? ?Initial troponin of 36 now 42. ? ?Initial EKG showed paced rhythm. ? ?CT head demonstrated chronic small vessel ischemic disease.  No acute stroke. ? ?Patient treated with IV Benadryl, IV Compazine. ? ?Patient states that his symptoms are completely resolved now.  He has no dizziness.  He was able to walk to the CT scanner without difficulty.  He was given 1000 cc of lactated Ringer's.  He states he feels completely normal now. ? ?Due to the patient having an slightly elevated troponin from 36 down to 42, [facility] contacted for consultation. ? ?

## 2021-04-13 NOTE — Consult Note (Signed)
?Hospitalist Consultation ?History and Physical  ? ? ?Daniel Odom IDP:[id number] DOB: 01-28-1935 DOA: 04/12/2021 ? ?DOS: the patient was seen and examined on 04/12/2021 ? ?PCP: [facility]  ? ?Patient coming from: Home ? ?I have personally briefly reviewed patient's old medical records in [place] ? ?CC: Dizziness ?HPI: ?86 year old African-American male history of type 2 diabetes, hypertension, CKD stage IIIa, history of Mobitz type II status post pacemaker presents to the ER today with sudden onset of dizziness starting around 6 PM on 04/12/2021.  Patient was somewhat nauseous.  He did not have any other strokelike symptoms aside from dizziness.  He admits to vertiginous type symptoms with room spinning.  He denies any chest pain, shortness of breath.  He was worried that he may have had another stroke and came to the ER for evaluation. ? ?On arrival temp 97.4 heart rate 67 blood pressure 146/75 satting 99% on room air. ? ?Laboratory evaluation: ?Urinalysis negative ? ?Chemistry sodium 139 BUN of 24 creatinine 2.0 glucose of 68 ? ?Lipase normal at 47 ?White count 5.5, hemoglobin 9.5, platelets 230 ? ?Initial troponin of 36 now 42. ? ?Initial EKG showed paced rhythm. ? ?CT head demonstrated chronic small vessel ischemic disease.  No acute stroke. ? ?Patient treated with IV Benadryl, IV Compazine. ? ?Patient states that his symptoms are completely resolved now.  He has no dizziness.  He was able to walk to the CT scanner without difficulty.  He was given 1000 cc of lactated Ringer's.  He states he feels completely normal now. ? ?Due to the patient having an slightly elevated troponin from 36 down to 42, [facility] contacted for consultation. ?  ? ?ED Course: CT head negative. Treated with benadryl and compazine. Vertigo symptoms resolved completely. Troponin 36, then 42. EDP wanted pt admitted. ? ?Review of Systems:  ?Review of Systems  ?Constitutional: Negative.   ?HENT: Negative.    ?Eyes:  Negative.   ?Respiratory: Negative.    ?Cardiovascular: Negative.  Negative for chest pain, palpitations, orthopnea, claudication, leg swelling and PND.  ?Gastrointestinal: Negative.   ?Genitourinary: Negative.   ?Musculoskeletal: Negative.   ?Skin: Negative.   ?Neurological:   ?     +vertigo sensation. Room spinning when he opened his eyes.  ?Psychiatric/Behavioral: Negative.    ? ?Past Medical History:  ?Diagnosis Date  ? Chronic kidney disease (CKD), stage III (moderate) (HCC)   ? Colon cancer ([place])   ? Diabetes mellitus without complication ([place])   ? Hypertension   ? Presence of permanent cardiac pacemaker 08/21/2019  ? Prostate cancer ([place])   ? SBO (small bowel obstruction) ([place]) 08/03/2018  ? 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 TIA (transient ischemic attack) 06/01/2019  ? ? ?Past Surgical History:  ?Procedure Laterality Date  ? CARPAL TUNNEL RELEASE    ? COLON SURGERY    ? INSERT / REPLACE / REMOVE PACEMAKER  08/21/2019  ? LAPAROSCOPIC LYSIS OF ADHESIONS N/A 08/08/2018  ? Procedure: LAPAROSCOPIC LYSIS OF ADHESIONS;  Surgeon: Kinsinger, Arta Bruce, MD;  Location: [facility];  Service: General;  Laterality: N/A;  ? LAPAROTOMY N/A 08/08/2018  ? Procedure: Exploratory Laparotomy;  Surgeon: Kinsinger, Arta Bruce, MD;  Location: [facility];  Service: General;  Laterality: N/A;  ? LYSIS OF ADHESION N/A 08/08/2018  ? Procedure: Lysis of adhesions with repair of small intestine;  Surgeon: Kinsinger, Arta Bruce, MD;  Location: [facility];  Service: General;  Laterality: N/A;  ? PACEMAKER IMPLANT N/A 08/21/2019  ? Procedure: PACEMAKER IMPLANT;  Surgeon: Constance Haw, MD;  Location: [facility];  Service: Cardiovascular;  Laterality: N/A;  ? ? ? reports that he has never smoked. He has never used smokeless tobacco. He reports that he does not currently use alcohol. He reports that he does not use drugs. ? ?Allergies  ?Allergen Reactions  ? Metformin And Related Hives  ? Penicillins Hives  ?  Did it involve swelling of the  face/tongue/throat, SOB, or low BP? Yes ?Did it involve sudden or severe rash/hives, skin peeling, or any reaction on the inside of your mouth or nose? No ?Did you need to seek medical attention at a hospital or doctor's office? Yes ?When did it last happen? 1968 ?If all above answers are "NO", may proceed with cephalosporin use. ?  ? ? ?Family History  ?Problem Relation Age of Onset  ? Diabetes Mellitus II Brother   ? Colon cancer Neg Hx   ? ? ?Prior to Admission medications   ?Medication Sig Start Date End Date Taking? Authorizing Provider  ?albuterol (PROVENTIL) (2.5 MG/3ML) 0.083% nebulizer solution Inhale 3 mLs into the lungs 4 (four) times daily as needed. 04/14/20  Yes [provider]  ?albuterol (VENTOLIN HFA) 108 (90 Base) MCG/ACT inhaler Inhale 2 puffs into the lungs every 6 (six) hours as needed for wheezing or shortness of breath.   Yes [provider]  ?amLODipine (NORVASC) 5 MG tablet Take 5 mg by mouth daily. 06/05/20  Yes [provider]  ?aspirin EC 81 MG tablet Take 81 mg by mouth daily. Swallow whole.   Yes [provider]  ?atorvastatin (LIPITOR) 40 MG tablet Take 40 mg by mouth at bedtime.    Yes [provider]  ?calcium citrate (CALCITRATE - DOSED IN MG ELEMENTAL CALCIUM) 950 (200 Ca) MG tablet Take 400 mg of elemental calcium by mouth 2 (two) times daily.   Yes [provider]  ?Carboxymethylcellulose Sod PF (REFRESH PLUS) 0.5 % SOLN Place 1 drop into both eyes in the morning and at bedtime.   Yes [provider]  ?Cholecalciferol (VITAMIN D-3) 25 MCG (1000 UT) CAPS Take 1,000 Units by mouth daily.   Yes [provider]  ?clopidogrel (PLAVIX) 75 MG tablet Take 1 tablet (75 mg total) by mouth daily. 06/02/19  Yes [name], MD  ?ferrous sulfate 325 (65 FE) MG tablet Take 325 mg by mouth See admin instructions. Bid on Monday,Wednesday and friday   Yes [provider]  ?fluticasone (FLONASE) 50 MCG/ACT nasal  spray Place 1 spray into both nostrils daily as needed for allergies or rhinitis.   Yes [provider]  ?glucose 4 GM chewable tablet Chew 1 tablet by mouth as needed for low blood sugar.   Yes [provider]  ?insulin NPH-regular Human (NOVOLIN 70/30) (70-30) 100 UNIT/ML injection Inject 26-70 Units into the skin See admin instructions. Inject 70 units into the skin in the morning ?26 units in the evening   Yes [provider]  ?lisinopril-hydrochlorothiazide (ZESTORETIC) 20-12.5 MG tablet Take 1 tablet by mouth daily.   Yes [provider]  ?mometasone (ASMANEX) 220 MCG/INH inhaler Inhale 2 puffs into the lungs at bedtime.   Yes [provider]  ?polyethylene glycol (MIRALAX / GLYCOLAX) 17 g packet Take 17 g by mouth daily as needed for mild constipation or moderate constipation.  02/16/17  Yes [provider]  ?PRESCRIPTION MEDICATION CPAP- At bedtime   Yes [provider]  ?primidone (MYSOLINE) 50 MG tablet Take  100-150 mg by mouth See admin instructions. Take 100 mg by mouth in the morning and 150 mg at bedtime   Yes [provider]  ? ? ?Physical Exam: ?Vitals:  ? 04/13/21 0300 04/13/21 0400 04/13/21 0445 04/13/21 0545  ?BP:  127/77 (!) 147/72 (!) 129/97  ?Pulse: 68 65 60 61  ?Resp: (!) '21 20 15 16  '$ ?Temp:      ?TempSrc:      ?SpO2: 100% 98% 98% 98%  ?Weight:      ?Height:      ? ? ?Physical Exam ?Vitals and nursing note reviewed.  ?Constitutional:   ?   General: He is not in acute distress. ?   Appearance: Normal appearance. He is obese. He is not ill-appearing, toxic-appearing or diaphoretic.  ?HENT:  ?   Head: Normocephalic and atraumatic.  ?   Ears:  ?   Comments: +hearing aids ?   Nose: Nose normal. No rhinorrhea.  ?Cardiovascular:  ?   Rate and Rhythm: Normal rate and regular rhythm.  ?   Pulses: Normal pulses.  ?Pulmonary:  ?   Effort: Pulmonary effort is normal.  ?   Breath sounds: Wheezing present.  ?   Comments: Scattered  wheeze. Pt states he has asthma ?Abdominal:  ?   General: Abdomen is protuberant. Bowel sounds are normal. There is no distension.  ?   Palpations: Abdomen is soft.  ?   Tenderness: There is no abdominal ten

## 2021-04-13 NOTE — Assessment & Plan Note (Signed)
Stable

## 2021-04-13 NOTE — Assessment & Plan Note (Signed)
Chronic Stable 

## 2021-04-13 NOTE — Discharge Instructions (Addendum)
If you develop new or worsening headache, dizziness, or if you develop chest pain, shortness of breath, vomiting, weakness or numbness, or any other new/concerning symptoms then return to the ER for evaluation. ? ?Your troponin which is a heart blood marker was a little bit elevated but was not rising or concerning for heart attack or heart damage.  You will need your medical conditions optimized and treated by your primary care physician at the [facility].  Call them today. ?

## 2021-04-13 NOTE — ED Notes (Signed)
Per MD request, this RN tried to interrogate this patients pacemaker. However, this emergency department did not have the correct equipment to interrogate his [employer] Pacemaker. This RN called and got in touch with an operator who requested for a representative to come out and interrogate the pt's pacemaker.  ?

## 2021-04-13 NOTE — Assessment & Plan Note (Signed)
Patient presented without any cardiac symptoms.  Not sure what his elevated cardiac enzymes of 42 with only a delta of 6 means.  Patient does have CKD stage IIIa.  We will obtain a third set of enzymes.  I have placed a cardiology consult.  I do not think the patient needs to be admitted.  Will await cardiology opinion.  Wife showed me a copy of his echocardiogram from February 2023 that was done at the [place] [facility].  LVEF of 45 to 50%. ? ? ? ? ?

## 2021-04-13 NOTE — Assessment & Plan Note (Signed)
Patient's symptoms consistent with vertigo.  He was treated with Compazine and Benadryl.  His symptoms are completely resolved now.  Patient is able to walk.  He states that he feels completely normal now. ?

## 2021-04-13 NOTE — Assessment & Plan Note (Signed)
Chronic.  Patient now follows with the [facility]. ?

## 2021-04-13 NOTE — ED Notes (Signed)
This RN was able to interrogate pacemaker after getting in touch with the [facility] Rep. Pacemaker has been interrogated and transmitted. ?

## 2021-04-13 NOTE — Assessment & Plan Note (Signed)
Chronic.  Baseline creatinine 1.8-2.3 ?

## 2021-04-13 NOTE — ED Provider Notes (Signed)
Discussed with Dr. Acie Fredrickson.  He has reviewed the chart and is questioning if a cardiology consult is particularly necessary.  The troponins have been reviewed and they are flat at 36, 42 and 40.  Most likely a combination of his CHF and CKD with a creatinine of 2.  Patient feels completely asymptomatic at this point.  His dizziness sounds primarily like a vertigo though there was a lightheaded component.  Either way he is asymptomatic.  Hospitalist has already been consulted and was recommending discharge and cardiology consult.  At this point, I do not think an in person cardiology consult will change much and I think it is reasonable to discharge for him to follow-up closely with the [facility].  He and family agree.  He was given return precautions. ?  ?Sherwood Gambler, MD ?04/13/21 [phone] ? ?

## 2021-04-13 NOTE — Assessment & Plan Note (Signed)
Chronic. 

## 2022-08-28 ENCOUNTER — Encounter (HOSPITAL_COMMUNITY): Payer: Self-pay | Admitting: *Deleted

## 2022-08-28 ENCOUNTER — Emergency Department (HOSPITAL_COMMUNITY)
Admission: EM | Admit: 2022-08-28 | Discharge: 2022-08-28 | Disposition: A | Discharge status: Home / Self Care | Payer: No Typology Code available for payment source

## 2022-08-28 ENCOUNTER — Other Ambulatory Visit: Payer: Self-pay

## 2022-08-28 DIAGNOSIS — L0291 Cutaneous abscess, unspecified: Secondary | ICD-10-CM

## 2022-08-28 DIAGNOSIS — L0231 Cutaneous abscess of buttock: Secondary | ICD-10-CM | POA: Insufficient documentation

## 2022-08-28 DIAGNOSIS — Z7982 Long term (current) use of aspirin: Secondary | ICD-10-CM | POA: Insufficient documentation

## 2022-08-28 MED ORDER — LIDOCAINE-EPINEPHRINE (PF) 2 %-1:200000 IJ SOLN
10.0000 mL | Freq: Once | INTRAMUSCULAR | Status: AC
  Administered 2022-08-28: 10 mL
  Filled 2022-08-28: qty 20

## 2022-08-28 MED ORDER — SULFAMETHOXAZOLE-TRIMETHOPRIM 800-160 MG PO TABS: 1.0000 | ORAL_TABLET | Freq: Two times a day (BID) | ORAL | 0 refills | Status: AC

## 2022-08-28 NOTE — Discharge Instructions (Signed)
We are prescribing you antibiotics.  Please take them as prescribed for the full course.  Return to emergency department medially for fevers, chills, worsening pain, redness spreads up your back or to your perineum or you develop any new or worsening symptoms that are concerning to you.

## 2022-08-28 NOTE — ED Triage Notes (Signed)
Patient was sent from [facility] for further eval of knot on his left buttocks, states he noticed it Aug. 1 and it is getting better states its tender however not really painful.

## 2022-08-28 NOTE — ED Provider Notes (Signed)
[place] EMERGENCY DEPARTMENT AT [facility] Provider Note   CSN: [id number] Arrival date & time: 08/28/22  1353     History  Chief Complaint  Patient presents with   Cyst    Daniel Odom is a 87 y.o. male.  87 year old male present emergency department with lump on his left buttocks x 3 days.  Went to [place] and was sent to the emergency department.  Notes that he had a red type rash several days ago, and then a bump developed pain has gotten much bigger for the past 48 hours.  Denies any fevers, chills.         Home Medications Prior to Admission medications   Medication Sig Start Date End Date Taking? Authorizing Provider  albuterol (PROVENTIL) (2.5 MG/3ML) 0.083% nebulizer solution Inhale 3 mLs into the lungs 4 (four) times daily as needed. 04/14/20   [provider]  albuterol (VENTOLIN HFA) 108 (90 Base) MCG/ACT inhaler Inhale 2 puffs into the lungs every 6 (six) hours as needed for wheezing or shortness of breath.    [provider]  amLODipine (NORVASC) 5 MG tablet Take 5 mg by mouth daily. 06/05/20   [provider]  aspirin EC 81 MG tablet Take 81 mg by mouth daily. Swallow whole.    [provider]  atorvastatin (LIPITOR) 40 MG tablet Take 40 mg by mouth at bedtime.     [provider]  calcium citrate (CALCITRATE - DOSED IN MG ELEMENTAL CALCIUM) 950 (200 Ca) MG tablet Take 400 mg of elemental calcium by mouth 2 (two) times daily.    [provider]  Carboxymethylcellulose Sod PF (REFRESH PLUS) 0.5 % SOLN Place 1 drop into both eyes in the morning and at bedtime.    [provider]  Cholecalciferol (VITAMIN D-3) 25 MCG (1000 UT) CAPS Take 1,000 Units by mouth daily.    [provider]  clopidogrel (PLAVIX) 75 MG tablet Take 1 tablet (75 mg total) by mouth daily. 06/02/19   Marinda Elk, MD  ferrous sulfate 325 (65 FE) MG tablet Take 325 mg by mouth See admin instructions. Bid on  Monday,Wednesday and friday    [provider]  fluticasone (FLONASE) 50 MCG/ACT nasal spray Place 1 spray into both nostrils daily as needed for allergies or rhinitis.    [provider]  glucose 4 GM chewable tablet Chew 1 tablet by mouth as needed for low blood sugar.    [provider]  insulin NPH-regular Human (NOVOLIN 70/30) (70-30) 100 UNIT/ML injection Inject 26-70 Units into the skin See admin instructions. Inject 70 units into the skin in the morning 26 units in the evening    [provider]  lisinopril-hydrochlorothiazide (ZESTORETIC) 20-12.5 MG tablet Take 1 tablet by mouth daily.    [provider]  meclizine (ANTIVERT) 25 MG tablet Take 0.5-1 tablets (12.5-25 mg total) by mouth 2 (two) times daily as needed for dizziness. 04/13/21   Pricilla Loveless, MD  mometasone [facility]) 220 MCG/INH inhaler Inhale 2 puffs into the lungs at bedtime.    [provider]  polyethylene glycol (MIRALAX / GLYCOLAX) 17 g packet Take 17 g by mouth daily as needed for mild constipation or moderate constipation.  02/16/17   [provider]  PRESCRIPTION MEDICATION CPAP- At bedtime    [provider]  primidone (MYSOLINE) 50 MG tablet Take 100-150 mg by mouth See admin instructions. Take 100 mg by mouth in the morning and 150 mg at bedtime  [provider]      Allergies    Metformin and related and Penicillins    Review of Systems   Review of Systems  All other systems reviewed and are negative.   Physical Exam Updated Vital Signs BP 136/66   Pulse 95   Temp 98.7 F (37.1 C)   Resp 18   Ht 5\' 7"  (1.702 m)   Wt 121.1 kg   SpO2 99%   BMI 41.82 kg/m  Physical Exam Vitals and nursing note reviewed.  Constitutional:      General: He is not in acute distress.    Appearance: He is not ill-appearing or toxic-appearing.  HENT:     Head: Normocephalic.     Mouth/Throat:     Mouth: Mucous membranes are moist.   Eyes:     Conjunctiva/sclera: Conjunctivae normal.  Cardiovascular:     Rate and Rhythm: Normal rate and regular rhythm.  Pulmonary:     Effort: Pulmonary effort is normal.  Abdominal:     General: Abdomen is flat. There is no distension.  Genitourinary:    Comments: Large 8 cm area of induration and fluctuance to the left gluteal cleft with a small dime sized area of comedone overlying.  Minor erythema surrounding area. Neurological:     Mental Status: He is alert.  Psychiatric:        Mood and Affect: Mood normal.        Behavior: Behavior normal.     ED Results / Procedures / Treatments   Labs (all labs ordered are listed, but only abnormal results are displayed) Labs Reviewed - No data to display  EKG None  Radiology No results found.  Procedures .Marland KitchenIncision and Drainage  Date/Time: 08/28/2022 5:13 PM  Performed by: Coral Spikes, DO Authorized by: Coral Spikes, DO   Consent:    Consent obtained:  Verbal   Consent given by:  Patient   Risks discussed:  Bleeding, incomplete drainage, pain and infection   Alternatives discussed:  No treatment, delayed treatment and alternative treatment Universal protocol:    Patient identity confirmed:  Verbally with patient Location:    Type:  Abscess   Size:  8cm   Location:  Lower extremity   Lower extremity location:  Buttock   Buttock location:  R buttock Pre-procedure details:    Skin preparation:  Chlorhexidine with alcohol Sedation:    Sedation type:  None Anesthesia:    Anesthesia method:  Local infiltration   Local anesthetic:  Lidocaine 1% WITH epi Procedure type:    Complexity:  Simple Procedure details:    Ultrasound guidance: no     Incision types:  Stab incision   Incision depth:  Dermal   Wound management:  Probed and deloculated and irrigated with saline   Drainage:  Bloody and purulent   Drainage amount:  Scant (2cc)   Wound treatment:  Wound left open Post-procedure details:    Procedure  completion:  Tolerated     Medications Ordered in ED Medications  lidocaine-EPINEPHrine (XYLOCAINE W/EPI) 2 %-1:200000 (PF) injection 10 mL (has no administration in time range)    ED Course/ Medical Decision Making/ A&P                                 Medical Decision Making Well-appearing 87 year old male presents emergency department with abscess/pilonidal cyst.  He is afebrile vital signs reassuring.  Physical exam with some minor  erythema   This surrounding area of induration and fluctuance.  Exam not consistent with Fournier's.  Low suspicion for systemic infection given how localized it appears.  Plan for incision and drainage with lidocaine.  Will discharge antibiotics.  Amount and/or Complexity of Data Reviewed Independent Historian: caregiver    Details: Reports no fevers or chills. External Data Reviewed:     Details: Sent from [place]; paperwork sent with no useful information Labs:     Details: Consider labs, however stable vitals with localized infection.  Low suspicion that antibiotics would change management disposition. Radiology:     Details: Considered CT scan, however clinically patient with abscess.  Risk Prescription drug management.          Final Clinical Impression(s) / ED Diagnoses Final diagnoses:  None    Rx / DC Orders ED Discharge Orders     None         Coral Spikes, DO 08/28/22 1715

## 2023-09-14 ENCOUNTER — Other Ambulatory Visit (HOSPITAL_COMMUNITY): Payer: Self-pay | Admitting: Neurology

## 2023-09-14 DIAGNOSIS — R4189 Other symptoms and signs involving cognitive functions and awareness: Secondary | ICD-10-CM

## 2023-09-23 NOTE — CV Procedure (Signed)
  Device system confirmed to be MRI conditional, with implant date > 6 weeks ago, and no evidence of abandoned or epicardial leads in review of most recent CXR  Device last cleared by EP Provider: Daphne Barrack 09/22/2023  Clearance is good through for 1 year as long as parameters remain stable at time of check. If pt undergoes a cardiac device procedure during that time, they should be re-cleared.   Tachy-therapies to be programmed off if applicable with device back to pre-MRI settings after completion of exam.  [employer] will be present for programming for the MRI.   Izetta CHRISTELLA Linen, RT  09/23/2023 10:34 AM

## 2023-09-27 ENCOUNTER — Ambulatory Visit (HOSPITAL_COMMUNITY)
Admission: RE | Admit: 2023-09-27 | Discharge: 2023-09-27 | Disposition: A | Discharge status: Home / Self Care | Source: Ambulatory Visit | Attending: Neurology | Admitting: Neurology

## 2023-09-27 DIAGNOSIS — R4189 Other symptoms and signs involving cognitive functions and awareness: Secondary | ICD-10-CM | POA: Insufficient documentation

## 2023-09-27 DIAGNOSIS — G319 Degenerative disease of nervous system, unspecified: Secondary | ICD-10-CM

## 2023-09-27 NOTE — Progress Notes (Signed)
  Device system confirmed to be MRI conditional, with implant date > 6 weeks ago, and no evidence of abandoned or epicardial leads in review of most recent CXR  Device last cleared by EP Provider: Daphne Barrack NP 09/22/2023  Clearance is good through for 1 year as long as parameters remain stable at time of check. If pt undergoes a cardiac device procedure during that time, they should be re-cleared.   Tachy-therapies to be programmed off if applicable with device back to pre-MRI settings after completion of exam.  [employer] will be present for programming for the MRI.   Suzan Recardo Arna Debby  09/27/2023 9:01 AM

## 2023-09-27 NOTE — Progress Notes (Signed)
 Patient was monitored by this RN during MRI scan due to presence of a pacemaker. Cardiac rhythm was continuously monitored throughout the procedure. Prior to the start of the scan, the pacemaker was placed in MRI-safe mode by the pacemaker representative. Following the completion of the scan, the device was returned to its pre-MRI settings. Neurological status and orientation post-procedure were unchanged from baseline.   Pre-procedure Heart Rate (Prior to being placed in MRI safe mode): 61 Post-procedure Heart Rate (Once pacemaker is returned to baseline mode):  60
